# Patient Record
Sex: Female | Born: 1935 | Race: White | Hispanic: No | Smoking: Never smoker
Health system: Southern US, Community
[De-identification: ages and names within clinical notes are randomized; demographics above are authoritative.]

## PROBLEM LIST (undated history)

## (undated) DIAGNOSIS — C801 Malignant (primary) neoplasm, unspecified: Secondary | ICD-10-CM

## (undated) DIAGNOSIS — I10 Essential (primary) hypertension: Secondary | ICD-10-CM

## (undated) DIAGNOSIS — J45909 Unspecified asthma, uncomplicated: Secondary | ICD-10-CM

## (undated) DIAGNOSIS — Z9221 Personal history of antineoplastic chemotherapy: Secondary | ICD-10-CM

## (undated) HISTORY — PX: KIDNEY CYST REMOVAL: SHX684

---

## 1995-03-03 HISTORY — PX: REDUCTION MAMMAPLASTY: SUR839

## 1997-03-02 DIAGNOSIS — C50919 Malignant neoplasm of unspecified site of unspecified female breast: Secondary | ICD-10-CM

## 1997-03-02 DIAGNOSIS — C801 Malignant (primary) neoplasm, unspecified: Secondary | ICD-10-CM

## 1997-03-02 HISTORY — DX: Malignant neoplasm of unspecified site of unspecified female breast: C50.919

## 1997-03-02 HISTORY — DX: Malignant (primary) neoplasm, unspecified: C80.1

## 1997-03-02 HISTORY — PX: MASTECTOMY: SHX3

## 1997-03-02 HISTORY — PX: AUGMENTATION MAMMAPLASTY: SUR837

## 2004-10-21 ENCOUNTER — Ambulatory Visit: Payer: Self-pay | Admitting: Unknown Physician Specialty

## 2007-01-13 ENCOUNTER — Ambulatory Visit: Payer: Self-pay | Admitting: Unknown Physician Specialty

## 2008-05-01 ENCOUNTER — Ambulatory Visit: Payer: Self-pay | Admitting: Unknown Physician Specialty

## 2008-05-15 ENCOUNTER — Ambulatory Visit: Payer: Self-pay | Admitting: Unknown Physician Specialty

## 2009-10-22 ENCOUNTER — Ambulatory Visit: Payer: Self-pay | Admitting: Ophthalmology

## 2009-10-28 ENCOUNTER — Ambulatory Visit: Payer: Self-pay | Admitting: Ophthalmology

## 2009-10-29 ENCOUNTER — Ambulatory Visit: Payer: Self-pay | Admitting: Cardiovascular Disease

## 2010-05-02 ENCOUNTER — Ambulatory Visit: Payer: Self-pay | Admitting: Unknown Physician Specialty

## 2013-09-30 DIAGNOSIS — I129 Hypertensive chronic kidney disease with stage 1 through stage 4 chronic kidney disease, or unspecified chronic kidney disease: Secondary | ICD-10-CM | POA: Insufficient documentation

## 2013-09-30 DIAGNOSIS — J45909 Unspecified asthma, uncomplicated: Secondary | ICD-10-CM | POA: Insufficient documentation

## 2013-09-30 DIAGNOSIS — E7800 Pure hypercholesterolemia, unspecified: Secondary | ICD-10-CM | POA: Insufficient documentation

## 2013-09-30 DIAGNOSIS — K219 Gastro-esophageal reflux disease without esophagitis: Secondary | ICD-10-CM | POA: Insufficient documentation

## 2013-12-07 DIAGNOSIS — Z853 Personal history of malignant neoplasm of breast: Secondary | ICD-10-CM | POA: Insufficient documentation

## 2014-11-19 DIAGNOSIS — Z0000 Encounter for general adult medical examination without abnormal findings: Secondary | ICD-10-CM | POA: Insufficient documentation

## 2015-04-09 DIAGNOSIS — H40053 Ocular hypertension, bilateral: Secondary | ICD-10-CM | POA: Diagnosis not present

## 2015-04-11 DIAGNOSIS — E7800 Pure hypercholesterolemia, unspecified: Secondary | ICD-10-CM | POA: Diagnosis not present

## 2015-04-18 DIAGNOSIS — I129 Hypertensive chronic kidney disease with stage 1 through stage 4 chronic kidney disease, or unspecified chronic kidney disease: Secondary | ICD-10-CM | POA: Diagnosis not present

## 2015-04-18 DIAGNOSIS — K219 Gastro-esophageal reflux disease without esophagitis: Secondary | ICD-10-CM | POA: Diagnosis not present

## 2015-04-18 DIAGNOSIS — J4520 Mild intermittent asthma, uncomplicated: Secondary | ICD-10-CM | POA: Diagnosis not present

## 2015-04-18 DIAGNOSIS — E876 Hypokalemia: Secondary | ICD-10-CM | POA: Insufficient documentation

## 2015-04-18 DIAGNOSIS — N183 Chronic kidney disease, stage 3 (moderate): Secondary | ICD-10-CM | POA: Diagnosis not present

## 2015-04-18 DIAGNOSIS — E7800 Pure hypercholesterolemia, unspecified: Secondary | ICD-10-CM | POA: Diagnosis not present

## 2015-06-12 DIAGNOSIS — H903 Sensorineural hearing loss, bilateral: Secondary | ICD-10-CM | POA: Diagnosis not present

## 2015-06-12 DIAGNOSIS — H6123 Impacted cerumen, bilateral: Secondary | ICD-10-CM | POA: Diagnosis not present

## 2015-08-08 DIAGNOSIS — N183 Chronic kidney disease, stage 3 (moderate): Secondary | ICD-10-CM | POA: Diagnosis not present

## 2015-08-08 DIAGNOSIS — I129 Hypertensive chronic kidney disease with stage 1 through stage 4 chronic kidney disease, or unspecified chronic kidney disease: Secondary | ICD-10-CM | POA: Diagnosis not present

## 2015-08-15 DIAGNOSIS — I129 Hypertensive chronic kidney disease with stage 1 through stage 4 chronic kidney disease, or unspecified chronic kidney disease: Secondary | ICD-10-CM | POA: Diagnosis not present

## 2015-08-15 DIAGNOSIS — J4520 Mild intermittent asthma, uncomplicated: Secondary | ICD-10-CM | POA: Diagnosis not present

## 2015-08-15 DIAGNOSIS — N183 Chronic kidney disease, stage 3 (moderate): Secondary | ICD-10-CM | POA: Diagnosis not present

## 2015-08-15 DIAGNOSIS — E876 Hypokalemia: Secondary | ICD-10-CM | POA: Diagnosis not present

## 2015-08-15 DIAGNOSIS — E7800 Pure hypercholesterolemia, unspecified: Secondary | ICD-10-CM | POA: Diagnosis not present

## 2015-08-22 DIAGNOSIS — L820 Inflamed seborrheic keratosis: Secondary | ICD-10-CM | POA: Diagnosis not present

## 2015-08-22 DIAGNOSIS — L821 Other seborrheic keratosis: Secondary | ICD-10-CM | POA: Diagnosis not present

## 2015-08-22 DIAGNOSIS — L578 Other skin changes due to chronic exposure to nonionizing radiation: Secondary | ICD-10-CM | POA: Diagnosis not present

## 2015-10-08 DIAGNOSIS — H40053 Ocular hypertension, bilateral: Secondary | ICD-10-CM | POA: Diagnosis not present

## 2015-10-30 DIAGNOSIS — L820 Inflamed seborrheic keratosis: Secondary | ICD-10-CM | POA: Diagnosis not present

## 2015-11-22 DIAGNOSIS — Z1231 Encounter for screening mammogram for malignant neoplasm of breast: Secondary | ICD-10-CM | POA: Diagnosis not present

## 2015-12-12 DIAGNOSIS — H6123 Impacted cerumen, bilateral: Secondary | ICD-10-CM | POA: Diagnosis not present

## 2015-12-12 DIAGNOSIS — H902 Conductive hearing loss, unspecified: Secondary | ICD-10-CM | POA: Diagnosis not present

## 2015-12-12 DIAGNOSIS — R42 Dizziness and giddiness: Secondary | ICD-10-CM | POA: Diagnosis not present

## 2015-12-18 DIAGNOSIS — Z853 Personal history of malignant neoplasm of breast: Secondary | ICD-10-CM | POA: Diagnosis not present

## 2016-01-31 DIAGNOSIS — J4520 Mild intermittent asthma, uncomplicated: Secondary | ICD-10-CM | POA: Diagnosis not present

## 2016-01-31 DIAGNOSIS — R05 Cough: Secondary | ICD-10-CM | POA: Diagnosis not present

## 2016-01-31 DIAGNOSIS — J40 Bronchitis, not specified as acute or chronic: Secondary | ICD-10-CM | POA: Diagnosis not present

## 2016-02-13 DIAGNOSIS — E7800 Pure hypercholesterolemia, unspecified: Secondary | ICD-10-CM | POA: Diagnosis not present

## 2016-02-13 DIAGNOSIS — I129 Hypertensive chronic kidney disease with stage 1 through stage 4 chronic kidney disease, or unspecified chronic kidney disease: Secondary | ICD-10-CM | POA: Diagnosis not present

## 2016-02-13 DIAGNOSIS — N183 Chronic kidney disease, stage 3 (moderate): Secondary | ICD-10-CM | POA: Diagnosis not present

## 2016-02-13 DIAGNOSIS — J4520 Mild intermittent asthma, uncomplicated: Secondary | ICD-10-CM | POA: Diagnosis not present

## 2016-02-20 DIAGNOSIS — I129 Hypertensive chronic kidney disease with stage 1 through stage 4 chronic kidney disease, or unspecified chronic kidney disease: Secondary | ICD-10-CM | POA: Diagnosis not present

## 2016-02-20 DIAGNOSIS — E7800 Pure hypercholesterolemia, unspecified: Secondary | ICD-10-CM | POA: Diagnosis not present

## 2016-02-20 DIAGNOSIS — E876 Hypokalemia: Secondary | ICD-10-CM | POA: Diagnosis not present

## 2016-02-20 DIAGNOSIS — J4520 Mild intermittent asthma, uncomplicated: Secondary | ICD-10-CM | POA: Diagnosis not present

## 2016-02-20 DIAGNOSIS — Z0000 Encounter for general adult medical examination without abnormal findings: Secondary | ICD-10-CM | POA: Diagnosis not present

## 2016-02-20 DIAGNOSIS — N183 Chronic kidney disease, stage 3 (moderate): Secondary | ICD-10-CM | POA: Diagnosis not present

## 2016-03-16 DIAGNOSIS — H02005 Unspecified entropion of left lower eyelid: Secondary | ICD-10-CM | POA: Diagnosis not present

## 2016-04-06 DIAGNOSIS — H40053 Ocular hypertension, bilateral: Secondary | ICD-10-CM | POA: Diagnosis not present

## 2016-04-22 DIAGNOSIS — H02005 Unspecified entropion of left lower eyelid: Secondary | ICD-10-CM | POA: Diagnosis not present

## 2016-06-11 DIAGNOSIS — H6123 Impacted cerumen, bilateral: Secondary | ICD-10-CM | POA: Diagnosis not present

## 2016-06-11 DIAGNOSIS — R42 Dizziness and giddiness: Secondary | ICD-10-CM | POA: Diagnosis not present

## 2016-06-17 DIAGNOSIS — H02005 Unspecified entropion of left lower eyelid: Secondary | ICD-10-CM | POA: Diagnosis not present

## 2016-06-29 DIAGNOSIS — D485 Neoplasm of uncertain behavior of skin: Secondary | ICD-10-CM | POA: Diagnosis not present

## 2016-06-29 DIAGNOSIS — L720 Epidermal cyst: Secondary | ICD-10-CM | POA: Diagnosis not present

## 2016-06-29 DIAGNOSIS — C44319 Basal cell carcinoma of skin of other parts of face: Secondary | ICD-10-CM | POA: Diagnosis not present

## 2016-06-29 DIAGNOSIS — Z85828 Personal history of other malignant neoplasm of skin: Secondary | ICD-10-CM | POA: Diagnosis not present

## 2016-06-29 DIAGNOSIS — D229 Melanocytic nevi, unspecified: Secondary | ICD-10-CM | POA: Diagnosis not present

## 2016-06-29 DIAGNOSIS — Z1283 Encounter for screening for malignant neoplasm of skin: Secondary | ICD-10-CM | POA: Diagnosis not present

## 2016-06-29 DIAGNOSIS — Z853 Personal history of malignant neoplasm of breast: Secondary | ICD-10-CM | POA: Diagnosis not present

## 2016-06-29 DIAGNOSIS — D1800 Hemangioma unspecified site: Secondary | ICD-10-CM | POA: Diagnosis not present

## 2016-06-29 DIAGNOSIS — L821 Other seborrheic keratosis: Secondary | ICD-10-CM | POA: Diagnosis not present

## 2016-06-29 DIAGNOSIS — C4491 Basal cell carcinoma of skin, unspecified: Secondary | ICD-10-CM

## 2016-06-29 DIAGNOSIS — L812 Freckles: Secondary | ICD-10-CM | POA: Diagnosis not present

## 2016-06-29 DIAGNOSIS — L578 Other skin changes due to chronic exposure to nonionizing radiation: Secondary | ICD-10-CM | POA: Diagnosis not present

## 2016-06-29 DIAGNOSIS — D692 Other nonthrombocytopenic purpura: Secondary | ICD-10-CM | POA: Diagnosis not present

## 2016-06-29 HISTORY — DX: Basal cell carcinoma of skin, unspecified: C44.91

## 2016-07-09 DIAGNOSIS — R42 Dizziness and giddiness: Secondary | ICD-10-CM | POA: Diagnosis not present

## 2016-07-17 DIAGNOSIS — S86911A Strain of unspecified muscle(s) and tendon(s) at lower leg level, right leg, initial encounter: Secondary | ICD-10-CM | POA: Diagnosis not present

## 2016-08-13 DIAGNOSIS — E7800 Pure hypercholesterolemia, unspecified: Secondary | ICD-10-CM | POA: Diagnosis not present

## 2016-08-13 DIAGNOSIS — N183 Chronic kidney disease, stage 3 (moderate): Secondary | ICD-10-CM | POA: Diagnosis not present

## 2016-08-13 DIAGNOSIS — I129 Hypertensive chronic kidney disease with stage 1 through stage 4 chronic kidney disease, or unspecified chronic kidney disease: Secondary | ICD-10-CM | POA: Diagnosis not present

## 2016-08-13 DIAGNOSIS — E876 Hypokalemia: Secondary | ICD-10-CM | POA: Diagnosis not present

## 2016-08-13 DIAGNOSIS — Z0000 Encounter for general adult medical examination without abnormal findings: Secondary | ICD-10-CM | POA: Diagnosis not present

## 2016-08-20 DIAGNOSIS — E7800 Pure hypercholesterolemia, unspecified: Secondary | ICD-10-CM | POA: Diagnosis not present

## 2016-08-20 DIAGNOSIS — J4520 Mild intermittent asthma, uncomplicated: Secondary | ICD-10-CM | POA: Diagnosis not present

## 2016-08-20 DIAGNOSIS — I129 Hypertensive chronic kidney disease with stage 1 through stage 4 chronic kidney disease, or unspecified chronic kidney disease: Secondary | ICD-10-CM | POA: Diagnosis not present

## 2016-08-20 DIAGNOSIS — N183 Chronic kidney disease, stage 3 (moderate): Secondary | ICD-10-CM | POA: Diagnosis not present

## 2016-10-05 DIAGNOSIS — H401130 Primary open-angle glaucoma, bilateral, stage unspecified: Secondary | ICD-10-CM | POA: Diagnosis not present

## 2016-10-12 DIAGNOSIS — H2512 Age-related nuclear cataract, left eye: Secondary | ICD-10-CM | POA: Diagnosis not present

## 2016-10-14 DIAGNOSIS — J4521 Mild intermittent asthma with (acute) exacerbation: Secondary | ICD-10-CM | POA: Diagnosis not present

## 2016-10-14 DIAGNOSIS — R918 Other nonspecific abnormal finding of lung field: Secondary | ICD-10-CM | POA: Diagnosis not present

## 2016-11-27 DIAGNOSIS — Z1231 Encounter for screening mammogram for malignant neoplasm of breast: Secondary | ICD-10-CM | POA: Diagnosis not present

## 2016-12-14 DIAGNOSIS — H900 Conductive hearing loss, bilateral: Secondary | ICD-10-CM | POA: Diagnosis not present

## 2016-12-14 DIAGNOSIS — H6123 Impacted cerumen, bilateral: Secondary | ICD-10-CM | POA: Diagnosis not present

## 2017-02-18 DIAGNOSIS — I129 Hypertensive chronic kidney disease with stage 1 through stage 4 chronic kidney disease, or unspecified chronic kidney disease: Secondary | ICD-10-CM | POA: Diagnosis not present

## 2017-02-18 DIAGNOSIS — J4520 Mild intermittent asthma, uncomplicated: Secondary | ICD-10-CM | POA: Diagnosis not present

## 2017-02-18 DIAGNOSIS — E7800 Pure hypercholesterolemia, unspecified: Secondary | ICD-10-CM | POA: Diagnosis not present

## 2017-02-18 DIAGNOSIS — N183 Chronic kidney disease, stage 3 (moderate): Secondary | ICD-10-CM | POA: Diagnosis not present

## 2017-02-25 ENCOUNTER — Other Ambulatory Visit: Payer: Self-pay | Admitting: Internal Medicine

## 2017-02-25 DIAGNOSIS — Z0000 Encounter for general adult medical examination without abnormal findings: Secondary | ICD-10-CM | POA: Diagnosis not present

## 2017-02-25 DIAGNOSIS — E890 Postprocedural hypothyroidism: Secondary | ICD-10-CM | POA: Insufficient documentation

## 2017-02-25 DIAGNOSIS — Z853 Personal history of malignant neoplasm of breast: Secondary | ICD-10-CM | POA: Diagnosis not present

## 2017-02-25 DIAGNOSIS — E876 Hypokalemia: Secondary | ICD-10-CM | POA: Diagnosis not present

## 2017-02-25 DIAGNOSIS — J4521 Mild intermittent asthma with (acute) exacerbation: Secondary | ICD-10-CM | POA: Diagnosis not present

## 2017-02-25 DIAGNOSIS — E7800 Pure hypercholesterolemia, unspecified: Secondary | ICD-10-CM | POA: Diagnosis not present

## 2017-02-25 DIAGNOSIS — I129 Hypertensive chronic kidney disease with stage 1 through stage 4 chronic kidney disease, or unspecified chronic kidney disease: Secondary | ICD-10-CM | POA: Diagnosis not present

## 2017-02-25 DIAGNOSIS — N183 Chronic kidney disease, stage 3 (moderate): Secondary | ICD-10-CM | POA: Diagnosis not present

## 2017-03-12 DIAGNOSIS — I10 Essential (primary) hypertension: Secondary | ICD-10-CM | POA: Diagnosis not present

## 2017-03-12 DIAGNOSIS — C50912 Malignant neoplasm of unspecified site of left female breast: Secondary | ICD-10-CM | POA: Diagnosis not present

## 2017-04-09 DIAGNOSIS — I10 Essential (primary) hypertension: Secondary | ICD-10-CM | POA: Diagnosis not present

## 2017-05-04 ENCOUNTER — Inpatient Hospital Stay
Admission: RE | Admit: 2017-05-04 | Discharge: 2017-05-04 | Disposition: A | Discharge status: Home / Self Care | Payer: Self-pay | Source: Ambulatory Visit | Attending: *Deleted | Admitting: *Deleted

## 2017-05-04 ENCOUNTER — Other Ambulatory Visit: Payer: Self-pay | Admitting: *Deleted

## 2017-05-04 ENCOUNTER — Other Ambulatory Visit: Payer: Self-pay | Admitting: Internal Medicine

## 2017-05-04 DIAGNOSIS — Z9289 Personal history of other medical treatment: Secondary | ICD-10-CM

## 2017-05-04 DIAGNOSIS — Z1231 Encounter for screening mammogram for malignant neoplasm of breast: Secondary | ICD-10-CM

## 2017-06-14 DIAGNOSIS — H902 Conductive hearing loss, unspecified: Secondary | ICD-10-CM | POA: Diagnosis not present

## 2017-06-14 DIAGNOSIS — H6123 Impacted cerumen, bilateral: Secondary | ICD-10-CM | POA: Diagnosis not present

## 2017-06-30 DIAGNOSIS — L812 Freckles: Secondary | ICD-10-CM | POA: Diagnosis not present

## 2017-06-30 DIAGNOSIS — Z853 Personal history of malignant neoplasm of breast: Secondary | ICD-10-CM | POA: Diagnosis not present

## 2017-06-30 DIAGNOSIS — I788 Other diseases of capillaries: Secondary | ICD-10-CM | POA: Diagnosis not present

## 2017-06-30 DIAGNOSIS — L820 Inflamed seborrheic keratosis: Secondary | ICD-10-CM | POA: Diagnosis not present

## 2017-06-30 DIAGNOSIS — Z1283 Encounter for screening for malignant neoplasm of skin: Secondary | ICD-10-CM | POA: Diagnosis not present

## 2017-06-30 DIAGNOSIS — Z85828 Personal history of other malignant neoplasm of skin: Secondary | ICD-10-CM | POA: Diagnosis not present

## 2017-06-30 DIAGNOSIS — L821 Other seborrheic keratosis: Secondary | ICD-10-CM | POA: Diagnosis not present

## 2017-06-30 DIAGNOSIS — L578 Other skin changes due to chronic exposure to nonionizing radiation: Secondary | ICD-10-CM | POA: Diagnosis not present

## 2017-06-30 DIAGNOSIS — D1801 Hemangioma of skin and subcutaneous tissue: Secondary | ICD-10-CM | POA: Diagnosis not present

## 2017-06-30 DIAGNOSIS — L720 Epidermal cyst: Secondary | ICD-10-CM | POA: Diagnosis not present

## 2017-06-30 DIAGNOSIS — D225 Melanocytic nevi of trunk: Secondary | ICD-10-CM | POA: Diagnosis not present

## 2017-08-11 DIAGNOSIS — H40053 Ocular hypertension, bilateral: Secondary | ICD-10-CM | POA: Diagnosis not present

## 2017-08-19 DIAGNOSIS — I129 Hypertensive chronic kidney disease with stage 1 through stage 4 chronic kidney disease, or unspecified chronic kidney disease: Secondary | ICD-10-CM | POA: Diagnosis not present

## 2017-08-19 DIAGNOSIS — E7800 Pure hypercholesterolemia, unspecified: Secondary | ICD-10-CM | POA: Diagnosis not present

## 2017-08-19 DIAGNOSIS — N183 Chronic kidney disease, stage 3 (moderate): Secondary | ICD-10-CM | POA: Diagnosis not present

## 2017-08-26 DIAGNOSIS — E7800 Pure hypercholesterolemia, unspecified: Secondary | ICD-10-CM | POA: Diagnosis not present

## 2017-08-26 DIAGNOSIS — I129 Hypertensive chronic kidney disease with stage 1 through stage 4 chronic kidney disease, or unspecified chronic kidney disease: Secondary | ICD-10-CM | POA: Diagnosis not present

## 2017-08-26 DIAGNOSIS — J4521 Mild intermittent asthma with (acute) exacerbation: Secondary | ICD-10-CM | POA: Diagnosis not present

## 2017-08-26 DIAGNOSIS — Z9889 Other specified postprocedural states: Secondary | ICD-10-CM | POA: Diagnosis not present

## 2017-08-26 DIAGNOSIS — R42 Dizziness and giddiness: Secondary | ICD-10-CM | POA: Diagnosis not present

## 2017-08-26 DIAGNOSIS — E876 Hypokalemia: Secondary | ICD-10-CM | POA: Diagnosis not present

## 2017-08-26 DIAGNOSIS — N183 Chronic kidney disease, stage 3 (moderate): Secondary | ICD-10-CM | POA: Diagnosis not present

## 2017-08-31 DIAGNOSIS — R42 Dizziness and giddiness: Secondary | ICD-10-CM | POA: Diagnosis not present

## 2017-08-31 DIAGNOSIS — H8112 Benign paroxysmal vertigo, left ear: Secondary | ICD-10-CM | POA: Diagnosis not present

## 2017-09-06 DIAGNOSIS — R42 Dizziness and giddiness: Secondary | ICD-10-CM | POA: Diagnosis not present

## 2017-09-09 DIAGNOSIS — R42 Dizziness and giddiness: Secondary | ICD-10-CM | POA: Diagnosis not present

## 2017-09-13 DIAGNOSIS — R42 Dizziness and giddiness: Secondary | ICD-10-CM | POA: Diagnosis not present

## 2017-09-15 DIAGNOSIS — R42 Dizziness and giddiness: Secondary | ICD-10-CM | POA: Diagnosis not present

## 2017-09-17 DIAGNOSIS — R42 Dizziness and giddiness: Secondary | ICD-10-CM | POA: Diagnosis not present

## 2017-09-22 DIAGNOSIS — H40053 Ocular hypertension, bilateral: Secondary | ICD-10-CM | POA: Diagnosis not present

## 2017-09-23 DIAGNOSIS — R42 Dizziness and giddiness: Secondary | ICD-10-CM | POA: Diagnosis not present

## 2017-09-30 DIAGNOSIS — R42 Dizziness and giddiness: Secondary | ICD-10-CM | POA: Diagnosis not present

## 2017-10-07 DIAGNOSIS — R42 Dizziness and giddiness: Secondary | ICD-10-CM | POA: Diagnosis not present

## 2017-10-15 DIAGNOSIS — R42 Dizziness and giddiness: Secondary | ICD-10-CM | POA: Diagnosis not present

## 2017-10-20 DIAGNOSIS — H40053 Ocular hypertension, bilateral: Secondary | ICD-10-CM | POA: Diagnosis not present

## 2017-10-22 DIAGNOSIS — R42 Dizziness and giddiness: Secondary | ICD-10-CM | POA: Diagnosis not present

## 2017-11-02 ENCOUNTER — Encounter (HOSPITAL_COMMUNITY): Payer: Self-pay

## 2017-11-02 ENCOUNTER — Other Ambulatory Visit: Payer: Self-pay | Admitting: Internal Medicine

## 2017-11-02 ENCOUNTER — Ambulatory Visit
Admission: RE | Admit: 2017-11-02 | Discharge: 2017-11-02 | Disposition: A | Discharge status: Home / Self Care | Payer: PPO | Source: Ambulatory Visit | Attending: Internal Medicine | Admitting: Internal Medicine

## 2017-11-02 DIAGNOSIS — Z1231 Encounter for screening mammogram for malignant neoplasm of breast: Secondary | ICD-10-CM | POA: Insufficient documentation

## 2017-11-02 HISTORY — DX: Malignant (primary) neoplasm, unspecified: C80.1

## 2017-11-03 DIAGNOSIS — H40053 Ocular hypertension, bilateral: Secondary | ICD-10-CM | POA: Diagnosis not present

## 2017-11-18 DIAGNOSIS — L821 Other seborrheic keratosis: Secondary | ICD-10-CM | POA: Diagnosis not present

## 2017-11-18 DIAGNOSIS — L820 Inflamed seborrheic keratosis: Secondary | ICD-10-CM | POA: Diagnosis not present

## 2017-11-18 DIAGNOSIS — L578 Other skin changes due to chronic exposure to nonionizing radiation: Secondary | ICD-10-CM | POA: Diagnosis not present

## 2017-12-13 DIAGNOSIS — H902 Conductive hearing loss, unspecified: Secondary | ICD-10-CM | POA: Diagnosis not present

## 2017-12-13 DIAGNOSIS — H6123 Impacted cerumen, bilateral: Secondary | ICD-10-CM | POA: Diagnosis not present

## 2018-01-04 DIAGNOSIS — H40053 Ocular hypertension, bilateral: Secondary | ICD-10-CM | POA: Diagnosis not present

## 2018-01-05 ENCOUNTER — Other Ambulatory Visit: Payer: Self-pay | Admitting: Internal Medicine

## 2018-01-05 DIAGNOSIS — Z1231 Encounter for screening mammogram for malignant neoplasm of breast: Secondary | ICD-10-CM

## 2018-02-18 DIAGNOSIS — N183 Chronic kidney disease, stage 3 (moderate): Secondary | ICD-10-CM | POA: Diagnosis not present

## 2018-02-18 DIAGNOSIS — I129 Hypertensive chronic kidney disease with stage 1 through stage 4 chronic kidney disease, or unspecified chronic kidney disease: Secondary | ICD-10-CM | POA: Diagnosis not present

## 2018-02-18 DIAGNOSIS — E7800 Pure hypercholesterolemia, unspecified: Secondary | ICD-10-CM | POA: Diagnosis not present

## 2018-02-28 DIAGNOSIS — N183 Chronic kidney disease, stage 3 (moderate): Secondary | ICD-10-CM | POA: Diagnosis not present

## 2018-02-28 DIAGNOSIS — J45901 Unspecified asthma with (acute) exacerbation: Secondary | ICD-10-CM | POA: Diagnosis not present

## 2018-02-28 DIAGNOSIS — J4521 Mild intermittent asthma with (acute) exacerbation: Secondary | ICD-10-CM | POA: Diagnosis not present

## 2018-02-28 DIAGNOSIS — E876 Hypokalemia: Secondary | ICD-10-CM | POA: Diagnosis not present

## 2018-02-28 DIAGNOSIS — E7800 Pure hypercholesterolemia, unspecified: Secondary | ICD-10-CM | POA: Diagnosis not present

## 2018-02-28 DIAGNOSIS — I129 Hypertensive chronic kidney disease with stage 1 through stage 4 chronic kidney disease, or unspecified chronic kidney disease: Secondary | ICD-10-CM | POA: Diagnosis not present

## 2018-02-28 DIAGNOSIS — Z0000 Encounter for general adult medical examination without abnormal findings: Secondary | ICD-10-CM | POA: Diagnosis not present

## 2018-05-10 ENCOUNTER — Encounter: Payer: Self-pay | Admitting: Emergency Medicine

## 2018-05-10 ENCOUNTER — Emergency Department: Payer: PPO

## 2018-05-10 ENCOUNTER — Inpatient Hospital Stay
Admission: EM | Admit: 2018-05-10 | Discharge: 2018-05-12 | DRG: 193 | Disposition: A | Discharge status: Home / Self Care | Payer: PPO | Attending: Internal Medicine | Admitting: Internal Medicine

## 2018-05-10 ENCOUNTER — Other Ambulatory Visit: Payer: Self-pay

## 2018-05-10 DIAGNOSIS — Z79899 Other long term (current) drug therapy: Secondary | ICD-10-CM

## 2018-05-10 DIAGNOSIS — R05 Cough: Secondary | ICD-10-CM | POA: Diagnosis not present

## 2018-05-10 DIAGNOSIS — Z888 Allergy status to other drugs, medicaments and biological substances status: Secondary | ICD-10-CM | POA: Diagnosis not present

## 2018-05-10 DIAGNOSIS — Z9012 Acquired absence of left breast and nipple: Secondary | ICD-10-CM

## 2018-05-10 DIAGNOSIS — Z87891 Personal history of nicotine dependence: Secondary | ICD-10-CM

## 2018-05-10 DIAGNOSIS — J9601 Acute respiratory failure with hypoxia: Secondary | ICD-10-CM | POA: Diagnosis present

## 2018-05-10 DIAGNOSIS — I10 Essential (primary) hypertension: Secondary | ICD-10-CM | POA: Diagnosis present

## 2018-05-10 DIAGNOSIS — J45901 Unspecified asthma with (acute) exacerbation: Secondary | ICD-10-CM | POA: Diagnosis present

## 2018-05-10 DIAGNOSIS — J189 Pneumonia, unspecified organism: Secondary | ICD-10-CM | POA: Diagnosis not present

## 2018-05-10 DIAGNOSIS — R0689 Other abnormalities of breathing: Secondary | ICD-10-CM | POA: Diagnosis not present

## 2018-05-10 DIAGNOSIS — R000 Tachycardia, unspecified: Secondary | ICD-10-CM | POA: Diagnosis not present

## 2018-05-10 DIAGNOSIS — Z853 Personal history of malignant neoplasm of breast: Secondary | ICD-10-CM

## 2018-05-10 DIAGNOSIS — J181 Lobar pneumonia, unspecified organism: Secondary | ICD-10-CM

## 2018-05-10 DIAGNOSIS — R0602 Shortness of breath: Secondary | ICD-10-CM | POA: Diagnosis not present

## 2018-05-10 HISTORY — DX: Unspecified asthma, uncomplicated: J45.909

## 2018-05-10 HISTORY — DX: Essential (primary) hypertension: I10

## 2018-05-10 LAB — CBC
HEMATOCRIT: 39.5 % (ref 36.0–46.0)
Hemoglobin: 12.9 g/dL (ref 12.0–15.0)
MCH: 29 pg (ref 26.0–34.0)
MCHC: 32.7 g/dL (ref 30.0–36.0)
MCV: 88.8 fL (ref 80.0–100.0)
NRBC: 0.3 % — AB (ref 0.0–0.2)
Platelets: 216 10*3/uL (ref 150–400)
RBC: 4.45 MIL/uL (ref 3.87–5.11)
RDW: 13.8 % (ref 11.5–15.5)
WBC: 7.2 10*3/uL (ref 4.0–10.5)

## 2018-05-10 LAB — BASIC METABOLIC PANEL
Anion gap: 8 (ref 5–15)
BUN: 20 mg/dL (ref 8–23)
CHLORIDE: 108 mmol/L (ref 98–111)
CO2: 26 mmol/L (ref 22–32)
CREATININE: 0.68 mg/dL (ref 0.44–1.00)
Calcium: 9.3 mg/dL (ref 8.9–10.3)
GFR calc non Af Amer: >60 mL/min (ref >60)
Glucose, Bld: 108 mg/dL — ABNORMAL HIGH (ref 70–99)
POTASSIUM: 3.4 mmol/L — AB (ref 3.5–5.1)
SODIUM: 142 mmol/L (ref 135–145)

## 2018-05-10 LAB — INFLUENZA PANEL BY PCR (TYPE A & B)
INFLBPCR: NEGATIVE
Influenza A By PCR: NEGATIVE

## 2018-05-10 LAB — TROPONIN I: Troponin I: <0.03 ng/mL (ref <0.03)

## 2018-05-10 LAB — TSH: TSH: 0.835 u[IU]/mL (ref 0.350–4.500)

## 2018-05-10 MED ORDER — POTASSIUM CHLORIDE CRYS ER 10 MEQ PO TBCR
10.0000 meq | EXTENDED_RELEASE_TABLET | Freq: Two times a day (BID) | ORAL | Status: DC
  Administered 2018-05-10 (×2): 10 meq via ORAL
  Filled 2018-05-10 (×2): qty 1

## 2018-05-10 MED ORDER — SODIUM CHLORIDE 0.9 % IV SOLN
1.0000 g | Freq: Once | INTRAVENOUS | Status: AC
  Administered 2018-05-10: 1 g via INTRAVENOUS
  Filled 2018-05-10: qty 10

## 2018-05-10 MED ORDER — SPIRONOLACTONE 25 MG PO TABS
25.0000 mg | ORAL_TABLET | Freq: Every day | ORAL | Status: DC
  Administered 2018-05-10 – 2018-05-12 (×3): 25 mg via ORAL
  Filled 2018-05-10 (×3): qty 1

## 2018-05-10 MED ORDER — SALINE SPRAY 0.65 % NA SOLN
1.0000 | NASAL | Status: DC | PRN
  Administered 2018-05-11 (×2): 1 via NASAL
  Filled 2018-05-10: qty 44

## 2018-05-10 MED ORDER — ACETAMINOPHEN 325 MG PO TABS: 650.0000 mg | ORAL_TABLET | Freq: Four times a day (QID) | ORAL | Status: DC | PRN

## 2018-05-10 MED ORDER — ACETAMINOPHEN 650 MG RE SUPP: 650.0000 mg | Freq: Four times a day (QID) | RECTAL | Status: DC | PRN

## 2018-05-10 MED ORDER — SODIUM CHLORIDE 0.9 % IV SOLN
INTRAVENOUS | Status: DC
  Administered 2018-05-10 – 2018-05-11 (×2): via INTRAVENOUS

## 2018-05-10 MED ORDER — SODIUM CHLORIDE 0.9 % IV SOLN
500.0000 mg | Freq: Once | INTRAVENOUS | Status: AC
  Administered 2018-05-10: 500 mg via INTRAVENOUS
  Filled 2018-05-10: qty 500

## 2018-05-10 MED ORDER — ENOXAPARIN SODIUM 40 MG/0.4ML ~~LOC~~ SOLN
40.0000 mg | SUBCUTANEOUS | Status: DC
  Administered 2018-05-10 – 2018-05-11 (×2): 40 mg via SUBCUTANEOUS
  Filled 2018-05-10 (×2): qty 0.4

## 2018-05-10 MED ORDER — ALBUTEROL SULFATE HFA 108 (90 BASE) MCG/ACT IN AERS: 2.0000 | INHALATION_SPRAY | RESPIRATORY_TRACT | Status: DC | PRN

## 2018-05-10 MED ORDER — AZITHROMYCIN 250 MG PO TABS
250.0000 mg | ORAL_TABLET | Freq: Every day | ORAL | Status: DC
  Administered 2018-05-11 – 2018-05-12 (×2): 250 mg via ORAL
  Filled 2018-05-10 (×2): qty 1

## 2018-05-10 MED ORDER — IPRATROPIUM-ALBUTEROL 0.5-2.5 (3) MG/3ML IN SOLN
3.0000 mL | Freq: Once | RESPIRATORY_TRACT | Status: AC
  Administered 2018-05-10: 3 mL via RESPIRATORY_TRACT
  Filled 2018-05-10: qty 3

## 2018-05-10 MED ORDER — METOPROLOL TARTRATE 25 MG PO TABS: 12.5000 mg | ORAL_TABLET | Freq: Once | ORAL | Status: DC

## 2018-05-10 MED ORDER — MOMETASONE FURO-FORMOTEROL FUM 200-5 MCG/ACT IN AERO
2.0000 | INHALATION_SPRAY | Freq: Two times a day (BID) | RESPIRATORY_TRACT | Status: DC
  Administered 2018-05-10 – 2018-05-12 (×5): 2 via RESPIRATORY_TRACT
  Filled 2018-05-10: qty 8.8

## 2018-05-10 MED ORDER — ALBUTEROL SULFATE (2.5 MG/3ML) 0.083% IN NEBU
2.5000 mg | INHALATION_SOLUTION | RESPIRATORY_TRACT | Status: DC | PRN
  Administered 2018-05-10 – 2018-05-11 (×3): 2.5 mg via RESPIRATORY_TRACT
  Filled 2018-05-10 (×3): qty 3

## 2018-05-10 MED ORDER — ONDANSETRON HCL 4 MG/2ML IJ SOLN: 4.0000 mg | Freq: Four times a day (QID) | INTRAMUSCULAR | Status: DC | PRN

## 2018-05-10 MED ORDER — ONDANSETRON HCL 4 MG PO TABS: 4.0000 mg | ORAL_TABLET | Freq: Four times a day (QID) | ORAL | Status: DC | PRN

## 2018-05-10 MED ORDER — DOCUSATE SODIUM 100 MG PO CAPS
100.0000 mg | ORAL_CAPSULE | Freq: Two times a day (BID) | ORAL | Status: DC
  Administered 2018-05-10 – 2018-05-12 (×5): 100 mg via ORAL
  Filled 2018-05-10 (×5): qty 1

## 2018-05-10 MED ORDER — LATANOPROST 0.005 % OP SOLN
1.0000 [drp] | Freq: Every day | OPHTHALMIC | Status: DC
  Filled 2018-05-10: qty 2.5

## 2018-05-10 MED ORDER — SODIUM CHLORIDE 0.9 % IV SOLN
1.0000 g | INTRAVENOUS | Status: DC
  Administered 2018-05-11 – 2018-05-12 (×2): 1 g via INTRAVENOUS
  Filled 2018-05-10: qty 1
  Filled 2018-05-10: qty 10

## 2018-05-10 NOTE — ED Notes (Signed)
AAOx3.  Skin warm and dry.  NAD 

## 2018-05-10 NOTE — ED Notes (Signed)
Assisted to toilet in room.

## 2018-05-10 NOTE — Progress Notes (Signed)
Patient admitted to 2A 241 from ED via stretcher.  Report given to this RN.  Patient oriented to room and surroundings.  Family at bedside.  Call bell in reach.  Tele box placed on patient.

## 2018-05-10 NOTE — ED Notes (Signed)
ED TO INPATIENT HANDOFF REPORT  ED Nurse Name and Phone #: Opal Sidles [phone]  S Name/Age/Gender Hayley Nolan 85 y.o. female Room/Bed: ED05A/ED05A  Code Status   Code Status: Not on file  Home/SNF/Other Home Patient oriented to: self, place, time and situation Is this baseline? Yes   Triage Complete: Triage complete  Chief Complaint SOB  Triage Note Pt arrived via EMS from home where EMS found pt tripoding at the end of stairwell in home. Pt received 125mg  solumedrol, 2 duonebs. Pt is able to speak in complete sentences on arrival. Audible rhonchi on assessment.    Allergies Allergies  Allergen Reactions  . Cephalexin Other (See Comments)    Possible headache     Level of Care/Admitting Diagnosis ED Disposition    ED Disposition Condition [facility] Area: [place] [[id number]]  Level of Care: Med-Surg [16]  Diagnosis: CAP (community acquired pneumonia) [818563]  Admitting Physician: Harrie Foreman [[id number]]  Attending Physician: Harrie Foreman [phone]  PT Class (Do Not Modify): Observation [104]  PT Acc Code (Do Not Modify): Observation [10022]       B Medical/Surgery History Past Medical History:  Diagnosis Date  . Asthma   . Cancer [facility]) 1999   Left breast pre cancerous- mastectomy with reconstruction  . Hypertension    Past Surgical History:  Procedure Laterality Date  . AUGMENTATION MAMMAPLASTY Left 1999  . MASTECTOMY Left 1999   Implant     A IV Location/Drains/Wounds Patient Lines/Drains/Airways Status   Active Line/Drains/Airways    Name:   Placement date:   Placement time:   Site:   Days:   Peripheral IV 05/10/18 Left Wrist   05/10/18    0458    Wrist   less than 1   Peripheral IV 05/10/18 Right Forearm   05/10/18    0704    Forearm   less than 1          Intake/Output Last 24 hours  Intake/Output Summary (Last 24 hours) at 05/10/2018 0277 Last data filed at 05/10/2018 0804 Gross per 24  hour  Intake 202.34 ml  Output -  Net 202.34 ml    Labs/Imaging Results for orders placed or performed during the hospital encounter of 05/10/18 (from the past 48 hour(s))  Basic metabolic panel     Status: Abnormal   Collection Time: 05/10/18  5:01 AM  Result Value Ref Range   Sodium 142 135 - 145 mmol/L   Potassium 3.4 (L) 3.5 - 5.1 mmol/L   Chloride 108 98 - 111 mmol/L   CO2 26 22 - 32 mmol/L   Glucose, Bld 108 (H) 70 - 99 mg/dL   BUN 20 8 - 23 mg/dL   Creatinine, Ser 0.68 0.44 - 1.00 mg/dL   Calcium 9.3 8.9 - 10.3 mg/dL   GFR calc non Af Amer >60 >60 mL/min   GFR calc Af Amer >60 >60 mL/min   Anion gap 8 5 - 15    Comment: Performed at [facility], [address] [place], [place] [address]  CBC     Status: Abnormal   Collection Time: 05/10/18  5:01 AM  Result Value Ref Range   WBC 7.2 4.0 - 10.5 K/uL   RBC 4.45 3.87 - 5.11 MIL/uL   Hemoglobin 12.9 12.0 - 15.0 g/dL   HCT 39.5 36.0 - 46.0 %   MCV 88.8 80.0 - 100.0 fL   MCH 29.0 26.0 - 34.0 pg  MCHC 32.7 30.0 - 36.0 g/dL   RDW 13.8 11.5 - 15.5 %   Platelets 216 150 - 400 K/uL   nRBC 0.3 (H) 0.0 - 0.2 %    Comment: Performed at [facility], [address] [place], [place] [address]  Troponin I - ONCE - STAT     Status: None   Collection Time: 05/10/18  5:01 AM  Result Value Ref Range   Troponin I <0.03 <0.03 ng/mL    Comment: Performed at [facility], [address] [place], [place] [address]  Influenza panel by PCR (type A & B)     Status: None   Collection Time: 05/10/18  5:01 AM  Result Value Ref Range   Influenza A By PCR NEGATIVE NEGATIVE   Influenza B By PCR NEGATIVE NEGATIVE    Comment: (NOTE) The Xpert Xpress Flu assay is intended as an aid in the diagnosis of  influenza and should not be used as a sole basis for treatment.  This  assay is FDA approved for nasopharyngeal swab specimens only. Nasal  washings and aspirates are unacceptable for Xpert Xpress Flu  testing. Performed at [facility], [address] [place], [place] [address]    Dg Chest Portable 1 View  Result Date: 05/10/2018 CLINICAL DATA:  Increasing shortness of breath EXAM: PORTABLE CHEST 1 VIEW COMPARISON:  05/02/2010 FINDINGS: Cardiac shadow is stable. Aortic calcifications are noted. Left basilar infiltrate is seen without sizable effusion. Lungs are otherwise clear. Prior breast implant on the left is noted. No bony abnormality is seen. IMPRESSION: Increased density in the left base consistent with acute infiltrate. Electronically Signed   By: Inez Catalina M.D.   On: 05/10/2018 05:37    Pending Labs [employer] (From admission, onward)    Start     Ordered   Signed and Held  Creatinine, serum  (enoxaparin (LOVENOX)    CrCl >/= 30 ml/min)  Weekly,   R    Comments:  while on enoxaparin therapy    Signed and Held   Signed and Held  TSH  Add-on,   R     Signed and Held          Vitals/Pain Today's Vitals   05/10/18 0452 05/10/18 0453 05/10/18 0455  BP: (!) 148/78 (!) 148/78 (!) 157/93  Pulse: (!) 102 99   Resp:  (!) 26   Temp:  97.6 F (36.4 C)   TempSrc:  Oral   SpO2: 94% 95%     Isolation Precautions Droplet precaution  Medications Medications  ipratropium-albuterol (DUONEB) 0.5-2.5 (3) MG/3ML nebulizer solution 3 mL (3 mLs Nebulization Given 05/10/18 0503)  cefTRIAXone (ROCEPHIN) 1 g in sodium chloride 0.9 % 100 mL IVPB ( Intravenous Stopped 05/10/18 0704)  azithromycin (ZITHROMAX) 500 mg in sodium chloride 0.9 % 250 mL IVPB (0 mg Intravenous Stopped 05/10/18 0804)    Mobility walks Low fall risk   Focused Assessments    R Recommendations: See Admitting Provider Note  Report given to:   Additional Notes:

## 2018-05-10 NOTE — ED Triage Notes (Signed)
Pt arrived via EMS from home where EMS found pt tripoding at the end of stairwell in home. Pt received 125mg  solumedrol, 2 duonebs. Pt is able to speak in complete sentences on arrival. Audible rhonchi on assessment.

## 2018-05-10 NOTE — ED Provider Notes (Signed)
[facility] Emergency Department Provider Note  ____________________________________________  Time seen: Approximately 6:19 AM  I have reviewed the triage vital signs and the nursing notes.   HISTORY  Chief Complaint Shortness of Breath    HPI Hayley Nolan is a 85 y.o. female with a past history of hypertension asthma and breast cancer who comes the ED due to shortness of breath, gradually worsening throughout the night.  She states that she is felt like she had a cold for the past 2 to 3 days and got worse tonight.  No aggravating or alleviating factors.  Denies chest pain.  Associated with a congested cough.  Denies fever.  EMS report an initial oxygen saturation of about 84%.  They gave 125 of Solu-Medrol and 2 duo nebs en route.    Past Medical History:  Diagnosis Date  . Asthma   . Cancer [facility]) 1999   Left breast pre cancerous- mastectomy with reconstruction  . Hypertension      There are no active problems to display for this patient.    Past Surgical History:  Procedure Laterality Date  . AUGMENTATION MAMMAPLASTY Left 1999  . MASTECTOMY Left 1999   Implant     Prior to Admission medications   Not on File     Allergies Patient has no known allergies.   Family History  Problem Relation Age of Onset  . Breast cancer Neg Hx     Social History Social History   Tobacco Use  . Smoking status: Former Research scientist (life sciences)  . Smokeless tobacco: Never Used  Substance Use Topics  . Alcohol use: Not on file  . Drug use: Not on file    Review of Systems  Constitutional:   No fever or chills.  ENT:   No sore throat.  Positive rhinorrhea. Cardiovascular:   No chest pain or syncope. Respiratory: Positive shortness of breath and cough. Gastrointestinal:   Negative for abdominal pain, vomiting and diarrhea.  Musculoskeletal:   Negative for focal pain or swelling All other systems reviewed and are negative except as documented above in ROS  and HPI.  ____________________________________________   PHYSICAL EXAM:  VITAL SIGNS: ED Triage Vitals  Enc Vitals Group     BP 05/10/18 0452 (!) 148/78     Pulse Rate 05/10/18 0452 (!) 102     Resp 05/10/18 0453 (!) 26     Temp 05/10/18 0453 97.6 F (36.4 C)     Temp Source 05/10/18 0453 Oral     SpO2 05/10/18 0452 94 %     Weight --      Height --      Head Circumference --      Peak Flow --      Pain Score --      Pain Loc --      Pain Edu? --      Excl. in [place]? --     Vital signs reviewed, nursing assessments reviewed.   Constitutional:   Alert and oriented.  Eyes:   Conjunctivae are normal. EOMI. PERRL. ENT      Head:   Normocephalic and atraumatic.      Nose:   No congestion/rhinnorhea.       Mouth/Throat:   MMM, no pharyngeal erythema. No peritonsillar mass.       Neck:   No meningismus. Full ROM. Hematological/Lymphatic/Immunilogical:   No cervical lymphadenopathy. Cardiovascular:   Tachycardia heart rate 100. Symmetric bilateral radial and DP pulses.  No murmurs. Cap refill less  than 2 seconds. Respiratory: Tachypnea, diffuse expiratory wheezing.  No focal crackles Gastrointestinal:   Soft and nontender. Non distended. There is no CVA tenderness.  No rebound, rigidity, or guarding.  Musculoskeletal:   Normal range of motion in all extremities. No joint effusions.  No lower extremity tenderness.  No edema. Neurologic:   Normal speech and language.  Motor grossly intact. No acute focal neurologic deficits are appreciated.  Skin:    Skin is warm, dry and intact. No rash noted.  No petechiae, purpura, or bullae.  ____________________________________________    LABS (pertinent positives/negatives) (all labs ordered are listed, but only abnormal results are displayed) Labs Reviewed  BASIC METABOLIC PANEL - Abnormal; Notable for the following components:      Result Value   Potassium 3.4 (*)    Glucose, Bld 108 (*)    All other components within normal  limits  CBC - Abnormal; Notable for the following components:   nRBC 0.3 (*)    All other components within normal limits  TROPONIN I  INFLUENZA PANEL BY PCR (TYPE A & B)   ____________________________________________   EKG  Interpreted by me Sinus tachycardia rate 106, left axis, normal intervals.  Poor R wave progression.  Normal ST segments and T waves.  ____________________________________________    OINOMVEHM  Dg Chest Portable 1 View  Result Date: 05/10/2018 CLINICAL DATA:  Increasing shortness of breath EXAM: PORTABLE CHEST 1 VIEW COMPARISON:  05/02/2010 FINDINGS: Cardiac shadow is stable. Aortic calcifications are noted. Left basilar infiltrate is seen without sizable effusion. Lungs are otherwise clear. Prior breast implant on the left is noted. No bony abnormality is seen. IMPRESSION: Increased density in the left base consistent with acute infiltrate. Electronically Signed   By: Inez Catalina M.D.   On: 05/10/2018 05:37    ____________________________________________   PROCEDURES Procedures  ____________________________________________  DIFFERENTIAL DIAGNOSIS   Pneumonia, pleural effusion, pulmonary edema, influenza, exacerbation of underlying COPD.  CLINICAL IMPRESSION / ASSESSMENT AND PLAN / ED COURSE  Medications ordered in the ED: Medications  cefTRIAXone (ROCEPHIN) 1 g in sodium chloride 0.9 % 100 mL IVPB (has no administration in time range)  azithromycin (ZITHROMAX) 500 mg in sodium chloride 0.9 % 250 mL IVPB (has no administration in time range)  ipratropium-albuterol (DUONEB) 0.5-2.5 (3) MG/3ML nebulizer solution 3 mL (3 mLs Nebulization Given 05/10/18 0503)    Pertinent labs & imaging results that were available during my care of the patient were reviewed by me and considered in my medical decision making (see chart for details).    Patient presents with shortness of breath.  Suspect COPD exacerbation or influenza.  On my initial assessment, I doubt  sepsis.  ----------------------------------------- 6:35 AM on 05/10/2018 ----------------------------------------- Labs unremarkable, white blood cell count normal.  Influenza negative.  Chest x-ray does show a left basilar infiltrate consistent with pneumonia.  After bronchodilators, respirations are improved, but still hypoxic to 88% on room air.  Will place on 2 L nasal cannula.  Plan to hospitalize for further management given her age and underlying lung disease.  Start ceftriaxone and azithromycin..      ____________________________________________   FINAL CLINICAL IMPRESSION(S) / ED DIAGNOSES    Final diagnoses:  Community acquired pneumonia of left lower lobe of lung ([place])  Acute respiratory failure with hypoxia [facility])     ED Discharge Orders    None      Portions of this note were generated with dragon dictation software. Dictation errors may occur despite best attempts at  proofreading.   Carrie Mew, MD 05/10/18 [phone]

## 2018-05-10 NOTE — H&P (Signed)
Hayley Nolan is an 85 y.o. female.   Chief Complaint: Shortness of breath HPI: Patient with past medical history of hypertension, asthma and history of cancer status post mastectomy presents to the emergency department complaining of shortness of breath.  The patient states that her dyspnea began today and is associated with a nonproductive cough.  Chest x-ray showed left lower lobe infiltrate consistent with pneumonia.  The patient started on fraction and azithromycin prior to the emergency department staff calling the hospitalist service for admission.  Past Medical History:  Diagnosis Date  . Asthma   . Cancer [facility]) 1999   Left breast pre cancerous- mastectomy with reconstruction  . Hypertension     Past Surgical History:  Procedure Laterality Date  . AUGMENTATION MAMMAPLASTY Left 1999  . MASTECTOMY Left 1999   Implant    Family History  Problem Relation Age of Onset  . Breast cancer Neg Hx    Social History:  reports that she has quit smoking. She has never used smokeless tobacco. No history on file for alcohol and drug.  Allergies:  Allergies  Allergen Reactions  . Cephalexin Other (See Comments)    Possible headache     Prior to Admission medications   Medication Sig Start Date End Date Taking? Authorizing Provider  ADVAIR DISKUS 250-50 MCG/DOSE AEPB Inhale 1 puff into the lungs every 12 (twelve) hours. 03/03/18  Yes [provider]  Cholecalciferol 25 MCG (1000 UT) tablet Take 1,000 Units by mouth daily.   Yes [provider]  latanoprost (XALATAN) 0.005 % ophthalmic solution Apply 1 drop to eye at bedtime.   Yes [provider]  Multiple Vitamins-Minerals (MULTIVITAL PLATINUM SILVER PO) Take 1 tablet by mouth daily.   Yes [provider]  Multiple Vitamins-Minerals (PRESERVISION AREDS) CAPS Take 1 capsule by mouth daily.   Yes [provider]  potassium chloride (K-DUR,KLOR-CON) 10 MEQ tablet Take 10 mEq by mouth 2 (two)  times daily. 04/30/18  Yes [provider]  PROAIR HFA 108 (90 Base) MCG/ACT inhaler Inhale 2 puffs into the lungs every 4 (four) hours as needed for wheezing. 04/02/18  Yes [provider]  spironolactone (ALDACTONE) 25 MG tablet Take 25 mg by mouth daily. 04/18/18  Yes [provider]     Results for orders placed or performed during the hospital encounter of 05/10/18 (from the past 48 hour(s))  Basic metabolic panel     Status: Abnormal   Collection Time: 05/10/18  5:01 AM  Result Value Ref Range   Sodium 142 135 - 145 mmol/L   Potassium 3.4 (L) 3.5 - 5.1 mmol/L   Chloride 108 98 - 111 mmol/L   CO2 26 22 - 32 mmol/L   Glucose, Bld 108 (H) 70 - 99 mg/dL   BUN 20 8 - 23 mg/dL   Creatinine, Ser 0.68 0.44 - 1.00 mg/dL   Calcium 9.3 8.9 - 10.3 mg/dL   GFR calc non Af Amer >60 >60 mL/min   GFR calc Af Amer >60 >60 mL/min   Anion gap 8 5 - 15    Comment: Performed at [facility], [address] [place], [place] [address]  CBC     Status: Abnormal   Collection Time: 05/10/18  5:01 AM  Result Value Ref Range   WBC 7.2 4.0 - 10.5 K/uL   RBC 4.45 3.87 - 5.11 MIL/uL   Hemoglobin 12.9 12.0 - 15.0 g/dL   HCT 39.5 36.0 - 46.0 %   MCV 88.8  80.0 - 100.0 fL   MCH 29.0 26.0 - 34.0 pg   MCHC 32.7 30.0 - 36.0 g/dL   RDW 13.8 11.5 - 15.5 %   Platelets 216 150 - 400 K/uL   nRBC 0.3 (H) 0.0 - 0.2 %    Comment: Performed at [facility], [address] [place], [place] [address]  Troponin I - ONCE - STAT     Status: None   Collection Time: 05/10/18  5:01 AM  Result Value Ref Range   Troponin I <0.03 <0.03 ng/mL    Comment: Performed at [facility], [address] [place], [place] [address]  Influenza panel by PCR (type A & B)     Status: None   Collection Time: 05/10/18  5:01 AM  Result Value Ref Range   Influenza A By PCR NEGATIVE NEGATIVE   Influenza B By PCR NEGATIVE NEGATIVE    Comment: (NOTE) The Xpert Xpress Flu assay is  intended as an aid in the diagnosis of  influenza and should not be used as a sole basis for treatment.  This  assay is FDA approved for nasopharyngeal swab specimens only. Nasal  washings and aspirates are unacceptable for Xpert Xpress Flu testing. Performed at [facility], [address] [place], [place] [address]    Dg Chest Portable 1 View  Result Date: 05/10/2018 CLINICAL DATA:  Increasing shortness of breath EXAM: PORTABLE CHEST 1 VIEW COMPARISON:  05/02/2010 FINDINGS: Cardiac shadow is stable. Aortic calcifications are noted. Left basilar infiltrate is seen without sizable effusion. Lungs are otherwise clear. Prior breast implant on the left is noted. No bony abnormality is seen. IMPRESSION: Increased density in the left base consistent with acute infiltrate. Electronically Signed   By: Inez Catalina M.D.   On: 05/10/2018 05:37    Review of Systems  Constitutional: Negative for chills and fever.  HENT: Negative for sore throat and tinnitus.   Eyes: Negative for blurred vision and redness.  Respiratory: Negative for cough and shortness of breath.   Cardiovascular: Negative for chest pain, palpitations, orthopnea and PND.  Gastrointestinal: Negative for abdominal pain, diarrhea, nausea and vomiting.  Genitourinary: Negative for dysuria, frequency and urgency.  Musculoskeletal: Negative for joint pain and myalgias.  Skin: Negative for rash.       No lesions  Neurological: Negative for speech change, focal weakness and weakness.  Endo/Heme/Allergies: Does not bruise/bleed easily.       No temperature intolerance  Psychiatric/Behavioral: Negative for depression and suicidal ideas.    Blood pressure (!) 157/93, pulse 99, temperature 97.6 F (36.4 C), temperature source Oral, resp. rate (!) 26, SpO2 95 %. Physical Exam  Vitals reviewed. Constitutional: She is oriented to person, place, and time. She appears well-developed and well-nourished. No distress.  HENT:  Head:  Normocephalic and atraumatic.  Mouth/Throat: Oropharynx is clear and moist.  Eyes: Pupils are equal, round, and reactive to light. Conjunctivae and EOM are normal. No scleral icterus.  Neck: Normal range of motion. Neck supple. No JVD present. No tracheal deviation present. No thyromegaly present.  Cardiovascular: Normal rate, regular rhythm and normal heart sounds. Exam reveals no gallop and no friction rub.  No murmur heard. Respiratory: Effort normal. She has wheezes.  GI: Soft. Bowel sounds are normal. She exhibits no distension. There is no abdominal tenderness.  Genitourinary:    Genitourinary Comments: Deferred   Lymphadenopathy:    She has no cervical adenopathy.  Neurological: She is alert and oriented to person, place,  and time. No cranial nerve deficit. She exhibits normal muscle tone.  Skin: Skin is warm and dry. No rash noted. No erythema.  Psychiatric: She has a normal mood and affect. Her behavior is normal. Judgment and thought content normal.     Assessment/Plan This is an 85 year old female admitted for pneumonia. 1.  Pneumonia: Community-acquired; continue azithromycin and ceftriaxone.  Pneumonia severity index 78 indicating brief hospital stay (2.8% 30-day mortality).  No assistant signs or symptoms of sepsis.  Supplemental oxygen as needed.  The patient does not wear oxygen at home 2.  Asthma: Mild; with exacerbation secondary to above.  Continue inhaled corticosteroid.  Albuterol as needed. 3.  Hypertension: Acceptable for age; continue home medications once verified by pharmacy 4.  DVT prophylaxis: Lovenox 5.  GI prophylaxis: None The patient is a full code.  Time spent on admission orders and patient care approximately 45 minutes  Harrie Foreman, MD 05/10/2018, 7:39 AM

## 2018-05-11 DIAGNOSIS — I10 Essential (primary) hypertension: Secondary | ICD-10-CM | POA: Diagnosis present

## 2018-05-11 DIAGNOSIS — J45901 Unspecified asthma with (acute) exacerbation: Secondary | ICD-10-CM | POA: Diagnosis present

## 2018-05-11 DIAGNOSIS — Z79899 Other long term (current) drug therapy: Secondary | ICD-10-CM | POA: Diagnosis not present

## 2018-05-11 DIAGNOSIS — Z87891 Personal history of nicotine dependence: Secondary | ICD-10-CM | POA: Diagnosis not present

## 2018-05-11 DIAGNOSIS — Z853 Personal history of malignant neoplasm of breast: Secondary | ICD-10-CM | POA: Diagnosis not present

## 2018-05-11 DIAGNOSIS — J9601 Acute respiratory failure with hypoxia: Secondary | ICD-10-CM | POA: Diagnosis present

## 2018-05-11 DIAGNOSIS — Z888 Allergy status to other drugs, medicaments and biological substances status: Secondary | ICD-10-CM | POA: Diagnosis not present

## 2018-05-11 DIAGNOSIS — J189 Pneumonia, unspecified organism: Secondary | ICD-10-CM | POA: Diagnosis present

## 2018-05-11 DIAGNOSIS — Z9012 Acquired absence of left breast and nipple: Secondary | ICD-10-CM | POA: Diagnosis not present

## 2018-05-11 LAB — BASIC METABOLIC PANEL
Anion gap: 9 (ref 5–15)
BUN: 18 mg/dL (ref 8–23)
CO2: 23 mmol/L (ref 22–32)
CREATININE: 0.62 mg/dL (ref 0.44–1.00)
Calcium: 8.9 mg/dL (ref 8.9–10.3)
Chloride: 110 mmol/L (ref 98–111)
GFR calc Af Amer: >60 mL/min (ref >60)
GFR calc non Af Amer: >60 mL/min (ref >60)
Glucose, Bld: 110 mg/dL — ABNORMAL HIGH (ref 70–99)
Potassium: 3.2 mmol/L — ABNORMAL LOW (ref 3.5–5.1)
Sodium: 142 mmol/L (ref 135–145)

## 2018-05-11 LAB — BRAIN NATRIURETIC PEPTIDE: B Natriuretic Peptide: 129 pg/mL — ABNORMAL HIGH (ref 0.0–100.0)

## 2018-05-11 MED ORDER — METHYLPREDNISOLONE SODIUM SUCC 125 MG IJ SOLR
60.0000 mg | Freq: Two times a day (BID) | INTRAMUSCULAR | Status: DC
  Administered 2018-05-11 – 2018-05-12 (×3): 60 mg via INTRAVENOUS
  Filled 2018-05-11 (×3): qty 2

## 2018-05-11 MED ORDER — IPRATROPIUM-ALBUTEROL 0.5-2.5 (3) MG/3ML IN SOLN
3.0000 mL | Freq: Four times a day (QID) | RESPIRATORY_TRACT | Status: DC
  Administered 2018-05-11 – 2018-05-12 (×4): 3 mL via RESPIRATORY_TRACT
  Filled 2018-05-11 (×4): qty 3

## 2018-05-11 MED ORDER — POTASSIUM CHLORIDE CRYS ER 20 MEQ PO TBCR
40.0000 meq | EXTENDED_RELEASE_TABLET | ORAL | Status: AC
  Administered 2018-05-11 (×2): 40 meq via ORAL
  Filled 2018-05-11 (×2): qty 2

## 2018-05-11 MED ORDER — GUAIFENESIN-DM 100-10 MG/5ML PO SYRP
5.0000 mL | ORAL_SOLUTION | ORAL | Status: DC | PRN
  Administered 2018-05-11: 5 mL via ORAL
  Filled 2018-05-11 (×2): qty 5

## 2018-05-11 NOTE — Progress Notes (Signed)
RN asked RT to give PRN treatment to patient. Patient experiencing shortness of breath. 92% SAT on Room Air. Bilateral coarse crackles. PRN treatment given for shortness of breath.

## 2018-05-11 NOTE — Progress Notes (Signed)
[facility] [place] NAME: Nili Honda    MR#:  [id number]  DATE OF BIRTH:  07/13/1927  SUBJECTIVE:  CHIEF COMPLAINT:   Chief Complaint  Patient presents with  . Shortness of Breath   Still has cough and SOB. Chest congestion.  REVIEW OF SYSTEMS:    Review of Systems  Constitutional: Positive for malaise/fatigue. Negative for chills and fever.  HENT: Negative for sore throat.   Eyes: Negative for blurred vision, double vision and pain.  Respiratory: Positive for cough, shortness of breath and wheezing. Negative for hemoptysis.   Cardiovascular: Negative for chest pain, palpitations, orthopnea and leg swelling.  Gastrointestinal: Negative for abdominal pain, constipation, diarrhea, heartburn, nausea and vomiting.  Genitourinary: Negative for dysuria and hematuria.  Musculoskeletal: Negative for back pain and joint pain.  Skin: Negative for rash.  Neurological: Negative for sensory change, speech change, focal weakness and headaches.  Endo/Heme/Allergies: Does not bruise/bleed easily.  Psychiatric/Behavioral: Negative for depression. The patient is not nervous/anxious.     DRUG ALLERGIES:   Allergies  Allergen Reactions  . Cephalexin Other (See Comments)    Possible headache     VITALS:  Blood pressure (!) 149/71, pulse 97, temperature 98.9 F (37.2 C), temperature source Oral, resp. rate 18, height 5\' 4"  (1.626 m), weight 61.9 kg, SpO2 93 %.  PHYSICAL EXAMINATION:   Physical Exam  GENERAL:  85 y.o.-year-old patient lying in the bed with no acute distress.  EYES: Pupils equal, round, reactive to light and accommodation. No scleral icterus. Extraocular muscles intact.  HEENT: Head atraumatic, normocephalic. Oropharynx and nasopharynx clear.  NECK:  Supple, no jugular venous distention. No thyroid enlargement, no tenderness.  LUNGS: b/l wheezing. CARDIOVASCULAR: S1, S2 normal. No murmurs, rubs, or gallops.  ABDOMEN: Soft,  nontender, nondistended. Bowel sounds present. No organomegaly or mass.  EXTREMITIES: No cyanosis, clubbing or edema b/l.    NEUROLOGIC: Cranial nerves II through XII are intact. No focal Motor or sensory deficits b/l.   PSYCHIATRIC: The patient is alert and oriented x 3.  SKIN: No obvious rash, lesion, or ulcer.   LABORATORY PANEL:   CBC Recent Labs  Lab 05/10/18 0501  WBC 7.2  HGB 12.9  HCT 39.5  PLT 216   ------------------------------------------------------------------------------------------------------------------ Chemistries  Recent Labs  Lab 05/11/18 0328  NA 142  K 3.2*  CL 110  CO2 23  GLUCOSE 110*  BUN 18  CREATININE 0.62  CALCIUM 8.9   ------------------------------------------------------------------------------------------------------------------  Cardiac Enzymes Recent Labs  Lab 05/10/18 0501  TROPONINI <0.03   ------------------------------------------------------------------------------------------------------------------  RADIOLOGY:  Dg Chest Portable 1 View  Result Date: 05/10/2018 CLINICAL DATA:  Increasing shortness of breath EXAM: PORTABLE CHEST 1 VIEW COMPARISON:  05/02/2010 FINDINGS: Cardiac shadow is stable. Aortic calcifications are noted. Left basilar infiltrate is seen without sizable effusion. Lungs are otherwise clear. Prior breast implant on the left is noted. No bony abnormality is seen. IMPRESSION: Increased density in the left base consistent with acute infiltrate. Electronically Signed   By: Inez Catalina M.D.   On: 05/10/2018 05:37     ASSESSMENT AND PLAN:   * Left lower lobe Pneumonia IV abx Will start steroids due to wheezing Nebs Cx BGTD  * Asthma exacerbation due to PNA Starting steroids Start Scheduled nebs  * HTN Continue home meds   All the records are reviewed and case discussed with Care Management/Social Workerr. Management plans discussed with the patient, family and they are in agreement.  CODE STATUS:  FULL CODE  DVT Prophylaxis: SCDs  TOTAL TIME TAKING CARE OF THIS PATIENT: 35 minutes.   POSSIBLE D/C IN 1-2 DAYS, DEPENDING ON CLINICAL CONDITION.  Leia Alf Akemi Overholser M.D on 05/11/2018 at 1:32 PM  Between 7am to 6pm - Pager - [phone]  After 6pm go to [email or web address] - password [email or web address] [facility]  Office  [phone]  CC: Primary care physician; Kirk Ruths, MD  Note: This dictation was prepared with Dragon dictation along with smaller phrase technology. Any transcriptional errors that result from this process are unintentional.

## 2018-05-11 NOTE — Plan of Care (Signed)

## 2018-05-12 MED ORDER — PREDNISONE 10 MG (21) PO TBPK: ORAL_TABLET | ORAL | 0 refills | Status: DC

## 2018-05-12 MED ORDER — LEVOFLOXACIN 500 MG PO TABS: 500.0000 mg | ORAL_TABLET | Freq: Every day | ORAL | 0 refills | Status: DC

## 2018-05-12 MED ORDER — GUAIFENESIN-DM 100-10 MG/5ML PO SYRP: 5.0000 mL | ORAL_SOLUTION | ORAL | 0 refills | Status: DC | PRN

## 2018-05-12 NOTE — Discharge Instructions (Signed)
Resume diet and activity as before ° ° °

## 2018-05-12 NOTE — TOC Initial Note (Signed)
Transition of Care [facility]) - Initial/Assessment Note    Patient Details  Name: Hayley Nolan MRN: [id number] Date of Birth: 03/02/1928  [facility]) CM/SW Contact:    Elza Rafter, RN Phone Number: 05/12/2018, 10:58 AM  Clinical Narrative:      Independent in all adls, denies issues accessing medical care, obtaining medications or with transportation.  Current with PCP.  No discharge needs identified at present by care manager or members of care team.               Expected Discharge Plan: Home/Self Care Barriers to Discharge: No Barriers Identified   Patient Goals and CMS Choice        Expected Discharge Plan and Services Expected Discharge Plan: Home/Self Care     Living arrangements for the past 2 months: Single Family Home                          Prior Living Arrangements/Services Living arrangements for the past 2 months: Single Family Home Lives with:: Self Patient language and need for interpreter reviewed:: Yes Do you feel safe going back to the place where you live?: Yes        Care giver support system in place?: Yes (comment)   Criminal Activity/Legal Involvement Pertinent to Current Situation/Hospitalization: No - Comment as needed  Activities of Daily Living Home Assistive Devices/Equipment: Eyeglasses ADL Screening (condition at time of admission) Patient's cognitive ability adequate to safely complete daily activities?: Yes Is the patient deaf or have difficulty hearing?: No Does the patient have difficulty seeing, even when wearing glasses/contacts?: No Does the patient have difficulty concentrating, remembering, or making decisions?: No Patient able to express need for assistance with ADLs?: Yes Does the patient have difficulty dressing or bathing?: No Independently performs ADLs?: Yes (appropriate for developmental age) Does the patient have difficulty walking or climbing stairs?: No Weakness of Legs: None Weakness of  Arms/Hands: None  Permission Sought/Granted                  Emotional Assessment Appearance:: Appears stated age Attitude/Demeanor/Rapport: Gracious Affect (typically observed): Accepting Orientation: : Oriented to Self, Oriented to Place, Oriented to  Time, Oriented to Situation Alcohol / Substance Use: Not Applicable    Admission diagnosis:  Acute respiratory failure with hypoxia ([place]) [J96.01] Community acquired pneumonia of left lower lobe of lung ([place]) [J18.1] Patient Active Problem List   Diagnosis Date Noted  . CAP (community acquired pneumonia) 05/10/2018   PCP:  Kirk Ruths, MD Pharmacy:   [place], [place] [facility] [address] [place] [address] Phone: [phone] Fax: [phone]     Social Determinants of Health (SDOH) Interventions    Readmission Risk Interventions 30 Day Unplanned Readmission Risk Score     ED to Hosp-Admission (Current) from 05/10/2018 in [facility] (2A)  30 Day Unplanned Readmission Risk Score (%)  10 Filed at 05/12/2018 0800     This score is the patient's risk of an unplanned readmission within 30 days of being discharged (0 -100%). The score is based on dignosis, age, lab data, medications, orders, and past utilization.   Low:  0-14.9   Medium: 15-21.9   High: 22-29.9   Extreme: 30 and above       No flowsheet data found.

## 2018-05-12 NOTE — Progress Notes (Signed)
Paitent aler tand oriented, vss no complaints of pain.  Escorted out ofhospital via wheelchair by volunteers.  D/c piv and tele box.  Family at bedside.

## 2018-05-16 ENCOUNTER — Other Ambulatory Visit: Payer: Self-pay

## 2018-05-16 NOTE — Patient Outreach (Signed)
[facility]) Care Management  05/16/2018  Hayley Nolan 1927-07-01 [id number]   EMMI- General Discharge RED ON EMMI ALERT Day # 1 Date: 05/14/2018 Red Alert Reason:  Know who to call about changes in condition? No    Outreach attempt: spoke with patient.  She is able to verify HIPAA.  She states she is doing ok.  Discussed red alert.  She states she knows to call her PCP.  Reiterated with patient the importance of contacting PCP for any changes in condition and importance of follow up appointments.  Patient states that she has a follow up on Thursday and has transportation.  Patient reports she has all her medications and denies any problems with those.  Patient denies any further questions of concerns.     Plan: RN CM will close case.    Jone Baseman, RN, MSN [facility] Care Management Care Management Coordinator Direct Line [phone] Toll Free: [phone]  Fax: [phone]

## 2018-05-18 NOTE — Discharge Summary (Signed)
[facility] [place] NAME: Hayley Nolan    MR#:  [id number]  DATE OF BIRTH:  09/25/1927  DATE OF ADMISSION:  05/10/2018 ADMITTING PHYSICIAN: Harrie Foreman, MD  DATE OF DISCHARGE: 05/12/2018 12:39 PM  PRIMARY CARE PHYSICIAN: Kirk Ruths, MD   ADMISSION DIAGNOSIS:  Acute respiratory failure with hypoxia ([place]) [J96.01] Community acquired pneumonia of left lower lobe of lung ([place]) [J18.1]  DISCHARGE DIAGNOSIS:  Active Problems:   CAP (community acquired pneumonia)   SECONDARY DIAGNOSIS:   Past Medical History:  Diagnosis Date  . Asthma   . Cancer [facility]) 1999   Left breast pre cancerous- mastectomy with reconstruction  . Hypertension      ADMITTING HISTORY  Chief Complaint: Shortness of breath HPI: Patient with past medical history of hypertension, asthma and history of cancer status post mastectomy presents to the emergency department complaining of shortness of breath.  The patient states that her dyspnea began today and is associated with a nonproductive cough.  Chest x-ray showed left lower lobe infiltrate consistent with pneumonia.  The patient started on fraction and azithromycin prior to the emergency department staff calling the hospitalist service for admission.  HOSPITAL COURSE:   * Left lower lobe Pneumonia Started on IV antibiotics on admission.  Admitted to medical floor with telemetry monitoring. On day 2 patient had wheezing and we added steroids. Her cultures remain negative.  She was off oxygen.  Afebrile.  Normal WBC. By the day of discharge patient improved well being close to baseline with her breathing.  Discharged home with oral antibiotics and prednisone taper.  Inhaler albuterol.  * Asthma exacerbation due to PNA Started steroid Significant improvement  * HTN Continue home meds   Patient discharged home in stable condition.  All questions answered.  CONSULTS OBTAINED:    DRUG ALLERGIES:    Allergies  Allergen Reactions  . Cephalexin Other (See Comments)    Possible headache     DISCHARGE MEDICATIONS:   Allergies as of 05/12/2018      Reactions   Cephalexin Other (See Comments)   Possible headache       Medication List    TAKE these medications   Advair Diskus 250-50 MCG/DOSE Aepb Generic drug:  Fluticasone-Salmeterol Inhale 1 puff into the lungs every 12 (twelve) hours.   Cholecalciferol 25 MCG (1000 UT) tablet Take 1,000 Units by mouth daily.   guaiFENesin-dextromethorphan 100-10 MG/5ML syrup Commonly known as:  ROBITUSSIN DM Take 5 mLs by mouth every 4 (four) hours as needed for cough.   levofloxacin 500 MG tablet Commonly known as:  Levaquin Take 1 tablet (500 mg total) by mouth daily.   MULTIVITAL PLATINUM SILVER PO Take 1 tablet by mouth daily.   PreserVision AREDS Caps Take 1 capsule by mouth daily.   potassium chloride 10 MEQ tablet Commonly known as:  K-DUR,KLOR-CON Take 10 mEq by mouth 2 (two) times daily.   predniSONE 10 MG (21) Tbpk tablet Commonly known as:  STERAPRED UNI-PAK 21 TAB 6 tabs day 1 and taper 10 mg a day - 6 days   ProAir HFA 108 (90 Base) MCG/ACT inhaler Generic drug:  albuterol Inhale 2 puffs into the lungs every 4 (four) hours as needed for wheezing.   spironolactone 25 MG tablet Commonly known as:  ALDACTONE Take 25 mg by mouth daily.   Xalatan 0.005 % ophthalmic solution Generic drug:  latanoprost Apply 1 drop to eye at bedtime.  Today   VITAL SIGNS:  Blood pressure (!) 158/90, pulse 99, temperature 98 F (36.7 C), temperature source Oral, resp. rate 16, height 5\' 4"  (1.626 m), weight 61.3 kg, SpO2 95 %.  I/O:  No intake or output data in the 24 hours ending 05/18/18 1916  PHYSICAL EXAMINATION:  Physical Exam  GENERAL:  85 y.o.-year-old patient lying in the bed with no acute distress.  LUNGS: Normal breath sounds bilaterally, no wheezing, rales,rhonchi or crepitation. No use of accessory  muscles of respiration.  CARDIOVASCULAR: S1, S2 normal. No murmurs, rubs, or gallops.  ABDOMEN: Soft, non-tender, non-distended. Bowel sounds present. No organomegaly or mass.  NEUROLOGIC: Moves all 4 extremities. PSYCHIATRIC: The patient is alert and oriented x 3.  SKIN: No obvious rash, lesion, or ulcer.   DATA REVIEW:   CBC No results for input(s): WBC, HGB, HCT, PLT in the last 168 hours.  Chemistries  No results for input(s): NA, K, CL, CO2, GLUCOSE, BUN, CREATININE, CALCIUM, MG, AST, ALT, ALKPHOS, BILITOT in the last 168 hours.  Invalid input(s): GFRCGP  Cardiac Enzymes No results for input(s): TROPONINI in the last 168 hours.  Microbiology Results  No results found for this or any previous visit.  RADIOLOGY:  No results found.  Follow up with PCP in 1 week.  Management plans discussed with the patient, family and they are in agreement.  CODE STATUS:  Code Status History    Date Active Date Inactive Code Status Order ID Comments User Context   05/10/2018 1025 05/12/2018 1540 Full Code [id number]  Harrie Foreman, MD Inpatient    Advance Directive Documentation     Most Recent Value  Type of Advance Directive  Healthcare Power of Attorney, Living will  Pre-existing out of facility DNR order (yellow form or pink MOST form)  -  "MOST" Form in Place?  -      TOTAL TIME TAKING CARE OF THIS PATIENT ON DAY OF DISCHARGE: more than 30 minutes.   Hayley Nolan Hayley Nolan M.D on 05/18/2018 at 7:16 PM  Between 7am to 6pm - Pager - [phone]  After 6pm go to [email or web address] - password [email or web address] [facility]  Office  [phone]  CC: Primary care physician; Kirk Ruths, MD  Note: This dictation was prepared with Dragon dictation along with smaller phrase technology. Any transcriptional errors that result from this process are unintentional.

## 2018-05-19 DIAGNOSIS — J4521 Mild intermittent asthma with (acute) exacerbation: Secondary | ICD-10-CM | POA: Diagnosis not present

## 2018-07-04 DIAGNOSIS — H902 Conductive hearing loss, unspecified: Secondary | ICD-10-CM | POA: Diagnosis not present

## 2018-07-04 DIAGNOSIS — H6123 Impacted cerumen, bilateral: Secondary | ICD-10-CM | POA: Diagnosis not present

## 2018-07-05 DIAGNOSIS — H40053 Ocular hypertension, bilateral: Secondary | ICD-10-CM | POA: Diagnosis not present

## 2018-07-11 DIAGNOSIS — D1800 Hemangioma unspecified site: Secondary | ICD-10-CM | POA: Diagnosis not present

## 2018-07-11 DIAGNOSIS — L570 Actinic keratosis: Secondary | ICD-10-CM | POA: Diagnosis not present

## 2018-07-11 DIAGNOSIS — Z85828 Personal history of other malignant neoplasm of skin: Secondary | ICD-10-CM | POA: Diagnosis not present

## 2018-07-11 DIAGNOSIS — Z1283 Encounter for screening for malignant neoplasm of skin: Secondary | ICD-10-CM | POA: Diagnosis not present

## 2018-07-11 DIAGNOSIS — L820 Inflamed seborrheic keratosis: Secondary | ICD-10-CM | POA: Diagnosis not present

## 2018-07-11 DIAGNOSIS — L720 Epidermal cyst: Secondary | ICD-10-CM | POA: Diagnosis not present

## 2018-07-11 DIAGNOSIS — L821 Other seborrheic keratosis: Secondary | ICD-10-CM | POA: Diagnosis not present

## 2018-07-11 DIAGNOSIS — L812 Freckles: Secondary | ICD-10-CM | POA: Diagnosis not present

## 2018-08-23 DIAGNOSIS — N183 Chronic kidney disease, stage 3 (moderate): Secondary | ICD-10-CM | POA: Diagnosis not present

## 2018-08-23 DIAGNOSIS — I129 Hypertensive chronic kidney disease with stage 1 through stage 4 chronic kidney disease, or unspecified chronic kidney disease: Secondary | ICD-10-CM | POA: Diagnosis not present

## 2018-08-23 DIAGNOSIS — E7800 Pure hypercholesterolemia, unspecified: Secondary | ICD-10-CM | POA: Diagnosis not present

## 2018-08-30 DIAGNOSIS — N183 Chronic kidney disease, stage 3 (moderate): Secondary | ICD-10-CM | POA: Diagnosis not present

## 2018-08-30 DIAGNOSIS — E876 Hypokalemia: Secondary | ICD-10-CM | POA: Diagnosis not present

## 2018-08-30 DIAGNOSIS — I129 Hypertensive chronic kidney disease with stage 1 through stage 4 chronic kidney disease, or unspecified chronic kidney disease: Secondary | ICD-10-CM | POA: Diagnosis not present

## 2018-08-30 DIAGNOSIS — J4521 Mild intermittent asthma with (acute) exacerbation: Secondary | ICD-10-CM | POA: Diagnosis not present

## 2018-08-30 DIAGNOSIS — E7800 Pure hypercholesterolemia, unspecified: Secondary | ICD-10-CM | POA: Diagnosis not present

## 2018-11-08 ENCOUNTER — Ambulatory Visit
Admission: RE | Admit: 2018-11-08 | Discharge: 2018-11-08 | Disposition: A | Discharge status: Home / Self Care | Payer: PPO | Source: Ambulatory Visit | Attending: Internal Medicine | Admitting: Internal Medicine

## 2018-11-08 DIAGNOSIS — Z1231 Encounter for screening mammogram for malignant neoplasm of breast: Secondary | ICD-10-CM | POA: Diagnosis not present

## 2018-11-10 ENCOUNTER — Other Ambulatory Visit: Payer: Self-pay | Admitting: Internal Medicine

## 2018-11-10 ENCOUNTER — Other Ambulatory Visit: Payer: Self-pay

## 2018-11-10 DIAGNOSIS — R928 Other abnormal and inconclusive findings on diagnostic imaging of breast: Secondary | ICD-10-CM

## 2018-11-10 DIAGNOSIS — R921 Mammographic calcification found on diagnostic imaging of breast: Secondary | ICD-10-CM

## 2018-11-18 ENCOUNTER — Ambulatory Visit
Admission: RE | Admit: 2018-11-18 | Discharge: 2018-11-18 | Disposition: A | Discharge status: Home / Self Care | Payer: PPO | Source: Ambulatory Visit | Attending: Internal Medicine | Admitting: Internal Medicine

## 2018-11-18 DIAGNOSIS — R928 Other abnormal and inconclusive findings on diagnostic imaging of breast: Secondary | ICD-10-CM

## 2018-11-18 DIAGNOSIS — R921 Mammographic calcification found on diagnostic imaging of breast: Secondary | ICD-10-CM | POA: Diagnosis not present

## 2018-11-21 ENCOUNTER — Other Ambulatory Visit: Payer: Self-pay | Admitting: Internal Medicine

## 2018-11-21 DIAGNOSIS — R921 Mammographic calcification found on diagnostic imaging of breast: Secondary | ICD-10-CM

## 2018-11-21 DIAGNOSIS — R928 Other abnormal and inconclusive findings on diagnostic imaging of breast: Secondary | ICD-10-CM

## 2018-11-30 ENCOUNTER — Ambulatory Visit
Admission: RE | Admit: 2018-11-30 | Discharge: 2018-11-30 | Disposition: A | Discharge status: Home / Self Care | Payer: PPO | Source: Ambulatory Visit | Attending: Internal Medicine | Admitting: Internal Medicine

## 2018-11-30 ENCOUNTER — Other Ambulatory Visit: Payer: Self-pay | Admitting: Internal Medicine

## 2018-11-30 ENCOUNTER — Other Ambulatory Visit: Payer: Self-pay

## 2018-11-30 DIAGNOSIS — R921 Mammographic calcification found on diagnostic imaging of breast: Secondary | ICD-10-CM | POA: Diagnosis not present

## 2018-11-30 DIAGNOSIS — Z7689 Persons encountering health services in other specified circumstances: Secondary | ICD-10-CM | POA: Diagnosis not present

## 2018-11-30 DIAGNOSIS — R928 Other abnormal and inconclusive findings on diagnostic imaging of breast: Secondary | ICD-10-CM

## 2018-11-30 DIAGNOSIS — C50411 Malignant neoplasm of upper-outer quadrant of right female breast: Secondary | ICD-10-CM | POA: Diagnosis not present

## 2018-11-30 HISTORY — PX: BREAST BIOPSY: SHX20

## 2018-12-05 ENCOUNTER — Encounter: Payer: Self-pay | Admitting: *Deleted

## 2018-12-05 DIAGNOSIS — C50911 Malignant neoplasm of unspecified site of right female breast: Secondary | ICD-10-CM

## 2018-12-05 NOTE — Progress Notes (Signed)
Called patient to establish navigation services.  She is newly diagnosed with invasive right breast cancer.  She has a history or a left breast "pre-cancer" with mastectomy and reconstruction in the 1990's at [place] with Dr. Glendale Chard.  States she wants to stay her for her care this time.  She did not know of a surgeon, but her primary care provider, Dr. Tonette Bihari preference is Dr Bary Castilla.  I have scheduled her to see Dr. Bary Castilla on 12/06/18 @ 3:00.  She asked about Dr. Grayland Ormond and I explained he is the medical oncologist and scheduled her to see him on 12/15/18 @ 3:00.  I will have her pick up her educational literature at her appointment with Dr. Bary Castilla.  States she will have her daughter take her to her appointment tomorrow.  Offered to give the daughter her appointments, but patient declined.  She is to call with any questions or needs.

## 2018-12-06 ENCOUNTER — Other Ambulatory Visit: Payer: Self-pay | Admitting: General Surgery

## 2018-12-06 DIAGNOSIS — C50411 Malignant neoplasm of upper-outer quadrant of right female breast: Secondary | ICD-10-CM

## 2018-12-06 MED ORDER — LIDOCAINE-PRILOCAINE 2.5-2.5 % EX CREA: 1.0000 "application " | TOPICAL_CREAM | CUTANEOUS | 0 refills | Status: DC | PRN

## 2018-12-07 ENCOUNTER — Other Ambulatory Visit: Payer: Self-pay | Admitting: General Surgery

## 2018-12-07 DIAGNOSIS — C50411 Malignant neoplasm of upper-outer quadrant of right female breast: Secondary | ICD-10-CM

## 2018-12-08 ENCOUNTER — Other Ambulatory Visit: Payer: Self-pay

## 2018-12-08 ENCOUNTER — Other Ambulatory Visit: Payer: Self-pay | Admitting: General Surgery

## 2018-12-09 LAB — SURGICAL PATHOLOGY

## 2018-12-12 DIAGNOSIS — C50411 Malignant neoplasm of upper-outer quadrant of right female breast: Secondary | ICD-10-CM | POA: Insufficient documentation

## 2018-12-12 NOTE — Progress Notes (Deleted)
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  INTERVAL HISTORY: ***  REVIEW OF SYSTEMS:   ROS  As per HPI. Otherwise, a complete review of systems is negative.  PAST MEDICAL HISTORY: Past Medical History:  Diagnosis Date   Asthma    Cancer ([place]) 1999   Left breast pre cancerous- mastectomy with reconstruction   Hypertension     PAST SURGICAL HISTORY: Past Surgical History:  Procedure Laterality Date   AUGMENTATION MAMMAPLASTY Left 1999   BREAST BIOPSY Right 11/30/2018   affirm stereo bx of calcs, x marker, path pending   BREAST BIOPSY Right 11/30/2018   affirm bx of calcs, ribbon marker,path pending   MASTECTOMY Left 1999   Implant   REDUCTION MAMMAPLASTY Right 1997    FAMILY HISTORY: Family History  Problem Relation Age of Onset   Breast cancer Neg Hx     ADVANCED DIRECTIVES (Y/N):  N  HEALTH MAINTENANCE: Social History   Tobacco Use   Smoking status: Former Smoker   Smokeless tobacco: Never Used  Substance Use Topics   Alcohol use: Not on file   Drug use: Not on file     Colonoscopy:  PAP:  Bone density:  Lipid panel:  Allergies  Allergen Reactions   Cephalexin Other (See Comments)    Possible headache     Current Outpatient Medications  Medication Sig Dispense Refill   ADVAIR DISKUS 250-50 MCG/DOSE AEPB Inhale 1 puff into the lungs every 12 (twelve) hours.     Biotin (BIOTIN 5000) 5 MG CAPS Take 5 mg by mouth daily.     Cholecalciferol 25 MCG (1000 UT) tablet Take 1,000 Units by mouth daily.     guaiFENesin-dextromethorphan (ROBITUSSIN DM) 100-10 MG/5ML syrup Take 5  mLs by mouth every 4 (four) hours as needed for cough. (Patient not taking: Reported on 12/09/2018) 118 mL 0   latanoprost (XALATAN) 0.005 % ophthalmic solution Apply 1 drop to eye at bedtime.     levofloxacin (LEVAQUIN) 500 MG tablet Take 1 tablet (500 mg total) by mouth daily. (Patient not taking: Reported on 12/09/2018) 4 tablet 0   lidocaine-prilocaine (EMLA) cream Apply 1 application topically as needed. 5 g 0   Multiple Vitamins-Minerals (MULTIVITAL PLATINUM SILVER PO) Take 1 tablet by mouth daily.     Multiple Vitamins-Minerals (PRESERVISION AREDS) CAPS Take 1 capsule by mouth 2 (two) times daily.      potassium chloride (K-DUR,KLOR-CON) 10 MEQ tablet Take 10 mEq by mouth 2 (two) times daily.     predniSONE (STERAPRED UNI-PAK 21 TAB) 10 MG (21) TBPK tablet 6 tabs day 1 and taper 10 mg a day - 6 days (Patient not taking: Reported on 12/09/2018) 21 tablet 0   PROAIR HFA 108 (90 Base) MCG/ACT inhaler Inhale 2 puffs into the lungs every 4 (four) hours as needed for wheezing.     spironolactone (ALDACTONE) 25 MG tablet Take 25 mg by mouth daily.     No current facility-administered medications for this visit.     OBJECTIVE: There were no vitals filed for this visit.   There is no height or weight on file to calculate BMI.    ECOG FS:{CHL ONC  [id number]  General: Well-developed, well-nourished, no acute distress. Eyes: Pink conjunctiva, anicteric sclera. HEENT: Normocephalic, moist mucous membranes, clear oropharnyx. Lungs: Clear to auscultation bilaterally. Heart: Regular rate and rhythm. No rubs, murmurs, or gallops. Abdomen: Soft, nontender, nondistended. No organomegaly noted, normoactive bowel sounds. Musculoskeletal: No edema, cyanosis, or clubbing. Neuro: Alert, answering all questions appropriately. Cranial nerves grossly intact. Skin: No rashes or petechiae noted. Psych: Normal affect. Lymphatics: No cervical, calvicular, axillary or inguinal LAD.   LAB  RESULTS:  Lab Results  Component Value Date   NA 142 05/11/2018   K 3.2 (L) 05/11/2018   CL 110 05/11/2018   CO2 23 05/11/2018   GLUCOSE 110 (H) 05/11/2018   BUN 18 05/11/2018   CREATININE 0.62 05/11/2018   CALCIUM 8.9 05/11/2018   GFRNONAA >60 05/11/2018   GFRAA >60 05/11/2018    Lab Results  Component Value Date   WBC 7.2 05/10/2018   HGB 12.9 05/10/2018   HCT 39.5 05/10/2018   MCV 88.8 05/10/2018   PLT 216 05/10/2018     STUDIES: Mm Digital Diagnostic Unilat R  Result Date: 11/18/2018 CLINICAL DATA:  Recall from screening mammography with tomosynthesis, calcifications involving the RIGHT breast.Personal history of malignant LEFT mastectomy in 1999 and RIGHT breast reduction mammoplasty. EXAM: DIGITAL DIAGNOSTIC UNILATERAL RIGHT MAMMOGRAM WITH CAD AND TOMO COMPARISON:  Previous exam(s). ACR Breast Density Category d: The breast tissue is extremely dense, which lowers the sensitivity of mammography. FINDINGS: Standard spot magnification CC and mediolateral views of the RIGHT breast calcifications and a standard 2D and tomosynthesis full field mediolateral view of the RIGHT breast were obtained. Spot magnification views confirm loosely grouped fine pleomorphic calcifications involving the LOWER OUTER QUADRANT at MIDDLE depth. These calcifications are located between benign coarse dystrophic calcifications which are scattered throughout the RIGHT breast. There is a group of fine linear calcifications at the posterolateral extent of this loose group which is immediately adjacent to a benign coarse dystrophic calcification. The entire loosely grouped calcifications span approximately 2.0 x 2.2 x 2.7 cm. No suspicious findings elsewhere in the RIGHT breast on the full field mediolateral tomosynthesis images. Mammographic images were processed with CAD. IMPRESSION: Indeterminate loosely grouped calcifications involving the LOWER OUTER QUADRANT of the RIGHT breast at MIDDLE depth. At the  posterolateral extent of the group is a group of fine linear calcifications. RECOMMENDATION: Stereotactic tomosynthesis core needle biopsy of the indeterminate RIGHT breast calcifications. The stereotactic tomosynthesis core biopsy procedure was discussed with the patient and her questions were answered. She wishes to discuss the procedure with her daughter prior to committing to the biopsy. The staff at [facility] will contact the patient regarding her decision as to whether to proceed with the biopsy. If she elects not to proceed with biopsy, diagnostic RIGHT mammogram with spot magnification views of the RIGHT breast calcifications would be recommended in 6 months. I have discussed the findings and recommendations with the patient. If applicable, a reminder letter will be sent to the patient regarding the next appointment. BI-RADS CATEGORY  4: Suspicious. Electronically Signed   By: Evangeline Dakin M.D.   On: 11/18/2018 09:14   Mm Clip Placement Right  Result Date: 11/30/2018 CLINICAL DATA:  Fine linear calcifications and pleomorphic calcifications in the lateral right breast. EXAM: DIAGNOSTIC RIGHT MAMMOGRAM POST STEREOTACTIC BIOPSY COMPARISON:  Previous exam(s). FINDINGS: Mammographic images were obtained following stereotactic guided biopsy of fine linear calcifications and pleomorphic calcifications in the outer right breast. An X shaped biopsy marking clip is appropriately positioned  in the upper-outer quadrant of the right breast at the site of fine linear calcifications. A ribbon shaped biopsy marking clip is propria lead positioned in the lower outer quadrant of the right breast at the site of fine pleomorphic calcifications. IMPRESSION: Appropriately positioned X shaped and ribbon shaped biopsy marking clips at the sites of biopsy in the right breast. Final Assessment: Post Procedure Mammograms for Marker Placement Electronically Signed   By: Zerita Boers M.D.   On: 11/30/2018 14:20    Mm Rt Breast Bx W Loc Dev 1st Lesion Image Bx Spec Stereo Guide  Addendum Date: 12/05/2018   ADDENDUM REPORT: 12/05/2018 11:38 ADDENDUM: PATHOLOGY revealed: A. RIGHT BREAST, UOQ; STEREOTACTIC NEEDLE CORE BIOPSY: - INVASIVE MAMMARY CARCINOMA, NO SPECIAL TYPE. 4 mm in this sample. Grade 2 at least. Ductal carcinoma in situ: Present, high-grade. B. RIGHT BREAST, LOQ; STEREOTACTIC NEEDLE CORE BIOPSY: - HIGH-GRADE DUCTAL CARCINOMA IN SITU, FOCAL. - SEE COMMENT. While the findings in specimen B do not meet size criteria to be classified as DCIS, they are morphologically similar and felt to represent the same process as seen in specimen A. Pathology results are CONCORDANT with imaging findings, per Dr. Zerita Boers. Of note, the area of calcifications spans 3.4 cm on the ML magnification view dated 11/18/2018. Pathology results were discussed with patient via telephone. The patient reported doing well after the biopsy with tenderness at the site. Post biopsy care instructions were reviewed and questions were answered. The patient was encouraged to call [facility] for any additional concerns. Recommendation: Surgical referral. Request for surgical referral was relayed to nurse navigators at [facility] by Electa Sniff RN on 12/05/2018. Patient has appointment with Dr. Ollen Bowl on 12/06/2018. Addendum by Electa Sniff RN on 12/05/2018. Electronically Signed   By: Zerita Boers M.D.   On: 12/05/2018 11:38   Result Date: 12/05/2018 CLINICAL DATA:  Loosely grouped fine pleomorphic calcifications in the lower outer right breast. EXAM: RIGHT BREAST STEREOTACTIC CORE NEEDLE BIOPSY COMPARISON:  Previous exams. FINDINGS: Given that there are both fine linear calcifications and fine pleomorphic calcification within the previously described group of microcalcifications in the lower outer right breast, the decision was made in conjunction with the patient to perform two biopsies; 1 of the fine  linear calcifications located superolaterally and just posterior to a coarse popcorn calcification and 1 of the fine pleomorphic calcifications located more anterior, inferior, and medial. The patient and I discussed the procedure of stereotactic-guided biopsy including benefits and alternatives. We discussed the high likelihood of a successful procedure. We discussed the risks of the procedure including infection, bleeding, tissue injury, clip migration, and inadequate sampling. Informed written consent was given. The usual time out protocol was performed immediately prior to the procedure. Site 1- Using sterile technique and 1% Lidocaine as local anesthetic, under stereotactic guidance, a 9 gauge vacuum assisted device was used to perform core needle biopsy of fine linear calcifications in the upper outer right breast using a lateral approach. Specimen radiograph was performed showing sampling of representative calcifications. Specimens with calcifications are identified for pathology. Lesion quadrant: Upper outer quadrant At the conclusion of the procedure, an X shaped tissue marker clip was deployed into the biopsy cavity. Follow-up 2-view mammogram was performed and dictated separately. Site 2- Using sterile technique and 1% Lidocaine as local anesthetic, under stereotactic guidance, a 9 gauge vacuum assisted device was used to perform core needle biopsy of fine pleomorphic calcifications in the lower outer right breast using a  lateral approach. Specimen radiograph was performed showing sampling of representative calcifications. Specimens with calcifications are identified for pathology. Lesion quadrant: Lower outer quadrant At the conclusion of the procedure, a ribbon shaped tissue marker clip was deployed into the biopsy cavity. Follow-up 2-view mammogram was performed and dictated separately. IMPRESSION: Stereotactic-guided two-site biopsy of microcalcifications in the lateral right breast. No apparent  complications. Electronically Signed: By: Zerita Boers M.D. On: 11/30/2018 11:21   Mm Rt Breast Bx W Loc Dev Ea Ad Lesion Img Bx Spec Stereo Guide  Addendum Date: 12/05/2018   ADDENDUM REPORT: 12/05/2018 11:38 ADDENDUM: PATHOLOGY revealed: A. RIGHT BREAST, UOQ; STEREOTACTIC NEEDLE CORE BIOPSY: - INVASIVE MAMMARY CARCINOMA, NO SPECIAL TYPE. 4 mm in this sample. Grade 2 at least. Ductal carcinoma in situ: Present, high-grade. B. RIGHT BREAST, LOQ; STEREOTACTIC NEEDLE CORE BIOPSY: - HIGH-GRADE DUCTAL CARCINOMA IN SITU, FOCAL. - SEE COMMENT. While the findings in specimen B do not meet size criteria to be classified as DCIS, they are morphologically similar and felt to represent the same process as seen in specimen A. Pathology results are CONCORDANT with imaging findings, per Dr. Zerita Boers. Of note, the area of calcifications spans 3.4 cm on the ML magnification view dated 11/18/2018. Pathology results were discussed with patient via telephone. The patient reported doing well after the biopsy with tenderness at the site. Post biopsy care instructions were reviewed and questions were answered. The patient was encouraged to call [facility] for any additional concerns. Recommendation: Surgical referral. Request for surgical referral was relayed to nurse navigators at [facility] by Electa Sniff RN on 12/05/2018. Patient has appointment with Dr. Ollen Bowl on 12/06/2018. Addendum by Electa Sniff RN on 12/05/2018. Electronically Signed   By: Zerita Boers M.D.   On: 12/05/2018 11:38   Result Date: 12/05/2018 CLINICAL DATA:  Loosely grouped fine pleomorphic calcifications in the lower outer right breast. EXAM: RIGHT BREAST STEREOTACTIC CORE NEEDLE BIOPSY COMPARISON:  Previous exams. FINDINGS: Given that there are both fine linear calcifications and fine pleomorphic calcification within the previously described group of microcalcifications in the lower outer right breast, the decision  was made in conjunction with the patient to perform two biopsies; 1 of the fine linear calcifications located superolaterally and just posterior to a coarse popcorn calcification and 1 of the fine pleomorphic calcifications located more anterior, inferior, and medial. The patient and I discussed the procedure of stereotactic-guided biopsy including benefits and alternatives. We discussed the high likelihood of a successful procedure. We discussed the risks of the procedure including infection, bleeding, tissue injury, clip migration, and inadequate sampling. Informed written consent was given. The usual time out protocol was performed immediately prior to the procedure. Site 1- Using sterile technique and 1% Lidocaine as local anesthetic, under stereotactic guidance, a 9 gauge vacuum assisted device was used to perform core needle biopsy of fine linear calcifications in the upper outer right breast using a lateral approach. Specimen radiograph was performed showing sampling of representative calcifications. Specimens with calcifications are identified for pathology. Lesion quadrant: Upper outer quadrant At the conclusion of the procedure, an X shaped tissue marker clip was deployed into the biopsy cavity. Follow-up 2-view mammogram was performed and dictated separately. Site 2- Using sterile technique and 1% Lidocaine as local anesthetic, under stereotactic guidance, a 9 gauge vacuum assisted device was used to perform core needle biopsy of fine pleomorphic calcifications in the lower outer right breast using a lateral approach. Specimen radiograph was performed showing sampling of  representative calcifications. Specimens with calcifications are identified for pathology. Lesion quadrant: Lower outer quadrant At the conclusion of the procedure, a ribbon shaped tissue marker clip was deployed into the biopsy cavity. Follow-up 2-view mammogram was performed and dictated separately. IMPRESSION: Stereotactic-guided  two-site biopsy of microcalcifications in the lateral right breast. No apparent complications. Electronically Signed: By: Zerita Boers M.D. 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  PLAN:    Patient expressed understanding and was in agreement with this plan. She also understands that She can call clinic at any time with any questions, concerns, or complaints.   Cancer Staging No matching staging information was found for the patient.  Lloyd Huger, MD   12/12/2018 7:50 PM

## 2018-12-13 ENCOUNTER — Encounter
Admission: RE | Admit: 2018-12-13 | Discharge: 2018-12-13 | Disposition: A | Discharge status: Home / Self Care | Payer: PPO | Source: Ambulatory Visit | Attending: Internal Medicine | Admitting: Internal Medicine

## 2018-12-13 ENCOUNTER — Other Ambulatory Visit: Payer: Self-pay

## 2018-12-13 DIAGNOSIS — Z20828 Contact with and (suspected) exposure to other viral communicable diseases: Secondary | ICD-10-CM | POA: Insufficient documentation

## 2018-12-13 DIAGNOSIS — I10 Essential (primary) hypertension: Secondary | ICD-10-CM | POA: Diagnosis not present

## 2018-12-13 DIAGNOSIS — Z01818 Encounter for other preprocedural examination: Secondary | ICD-10-CM | POA: Insufficient documentation

## 2018-12-13 DIAGNOSIS — I491 Atrial premature depolarization: Secondary | ICD-10-CM | POA: Insufficient documentation

## 2018-12-13 LAB — BASIC METABOLIC PANEL
Anion gap: 11 (ref 5–15)
BUN: 19 mg/dL (ref 8–23)
CO2: 28 mmol/L (ref 22–32)
Calcium: 9.6 mg/dL (ref 8.9–10.3)
Chloride: 102 mmol/L (ref 98–111)
Creatinine, Ser: 0.8 mg/dL (ref 0.44–1.00)
GFR calc Af Amer: >60 mL/min (ref >60)
GFR calc non Af Amer: >60 mL/min (ref >60)
Glucose, Bld: 93 mg/dL (ref 70–99)
Potassium: 3.8 mmol/L (ref 3.5–5.1)
Sodium: 141 mmol/L (ref 135–145)

## 2018-12-13 NOTE — Patient Instructions (Signed)
Your procedure is scheduled on: Friday 10/16 Report to Mammography.  @ 7:45  Remember: Instructions that are not followed completely may result in serious medical risk,  up to and including death, or upon the discretion of your surgeon and anesthesiologist your  surgery may need to be rescheduled.     _X__ 1. Do not eat food after midnight the night before your procedure.                 No gum chewing or hard candies. You may drink clear liquids up to 2 hours                 before you are scheduled to arrive for your surgery- DO not drink clear                 liquids within 2 hours of the start of your surgery.                 Clear Liquids include:  water, apple juice without pulp, clear carbohydrate                 drink such as Clearfast of Gatorade, Black Coffee or Tea (Do not add                 anything to coffee or tea).  __X__2.  On the morning of surgery brush your teeth with toothpaste and water, you                may rinse your mouth with mouthwash if you wish.  Do not swallow any toothpaste of mouthwash.     ___ 3.  No Alcohol for 24 hours before or after surgery.   ___ 4.  Do Not Smoke or use e-cigarettes For 24 Hours Prior to Your Surgery.                 Do not use any chewable tobacco products for at least 6 hours prior to                 surgery.  ____  5.  Bring all medications with you on the day of surgery if instructed.   _x___  6.  Notify your doctor if there is any change in your medical condition      (cold, fever, infections).     Do not wear jewelry, make-up, hairpins, clips or nail polish. Do not wear lotions, powders, or perfumes. You may wear deodorant. Do not shave 48 hours prior to surgery. Men may shave face and neck. Do not bring valuables to the hospital.    [facility] is not responsible for any belongings or valuables.  Contacts, dentures or bridgework may not be worn into surgery. Leave your suitcase in the car.  After surgery it may be brought to your room. For patients admitted to the hospital, discharge time is determined by your treatment team.   Patients discharged the day of surgery will not be allowed to drive home.   Please read over the following fact sheets that you were given:     ____ Take these medicines the morning of surgery with A SIP OF WATER:    1. none  2.   3.   4.  5.  6.  ____ Fleet Enema (as directed)   __x__ Use CHG Soap as directed  __x__ Use inhalers on the day of surgery ADVAIR DISKUS 250-50 MCG/DOSE AEPB,  ADVAIR DISKUS 250-50 MCG/DOSE AEPB and bring  with you  ____ Stop metformin 2 days prior to surgery    ____ Take 1/2 of usual insulin dose the night before surgery. No insulin the morning          of surgery.   ____ Stop Coumadin/Plavix/aspirin  __x__ Stop Anti-inflammatories No ibuprofen aleve or aspirin until after the surgery   _x___ Stop supplements until after surgery.Biotin (BIOTIN 5000) 5 MG CAPS    ____ Bring C-Pap to the hospital.

## 2018-12-14 LAB — SARS CORONAVIRUS 2 (TAT 6-24 HRS): SARS Coronavirus 2: NEGATIVE

## 2018-12-15 ENCOUNTER — Inpatient Hospital Stay: Payer: PPO | Admitting: Oncology

## 2018-12-16 ENCOUNTER — Ambulatory Visit
Admission: RE | Admit: 2018-12-16 | Discharge: 2018-12-16 | Disposition: A | Discharge status: Home / Self Care | Payer: PPO | Source: Ambulatory Visit | Attending: General Surgery | Admitting: General Surgery

## 2018-12-16 ENCOUNTER — Encounter
Admission: RE | Disposition: A | Discharge status: Home / Self Care | Payer: Self-pay | Source: Home / Self Care | Attending: General Surgery

## 2018-12-16 ENCOUNTER — Ambulatory Visit: Payer: PPO | Admitting: Certified Registered Nurse Anesthetist

## 2018-12-16 ENCOUNTER — Ambulatory Visit
Admission: RE | Admit: 2018-12-16 | Discharge: 2018-12-16 | Disposition: A | Discharge status: Home / Self Care | Payer: PPO | Attending: General Surgery | Admitting: General Surgery

## 2018-12-16 ENCOUNTER — Encounter: Payer: Self-pay | Admitting: *Deleted

## 2018-12-16 ENCOUNTER — Encounter
Admission: RE | Admit: 2018-12-16 | Discharge: 2018-12-16 | Disposition: A | Discharge status: Home / Self Care | Payer: PPO | Source: Ambulatory Visit | Attending: General Surgery | Admitting: General Surgery

## 2018-12-16 ENCOUNTER — Other Ambulatory Visit: Payer: Self-pay

## 2018-12-16 DIAGNOSIS — Z853 Personal history of malignant neoplasm of breast: Secondary | ICD-10-CM | POA: Diagnosis not present

## 2018-12-16 DIAGNOSIS — C50411 Malignant neoplasm of upper-outer quadrant of right female breast: Secondary | ICD-10-CM

## 2018-12-16 DIAGNOSIS — J45909 Unspecified asthma, uncomplicated: Secondary | ICD-10-CM | POA: Insufficient documentation

## 2018-12-16 DIAGNOSIS — Z881 Allergy status to other antibiotic agents status: Secondary | ICD-10-CM | POA: Diagnosis not present

## 2018-12-16 DIAGNOSIS — Z79899 Other long term (current) drug therapy: Secondary | ICD-10-CM | POA: Diagnosis not present

## 2018-12-16 DIAGNOSIS — D0511 Intraductal carcinoma in situ of right breast: Secondary | ICD-10-CM | POA: Diagnosis not present

## 2018-12-16 DIAGNOSIS — Z7951 Long term (current) use of inhaled steroids: Secondary | ICD-10-CM | POA: Diagnosis not present

## 2018-12-16 DIAGNOSIS — I10 Essential (primary) hypertension: Secondary | ICD-10-CM | POA: Diagnosis not present

## 2018-12-16 DIAGNOSIS — C50911 Malignant neoplasm of unspecified site of right female breast: Secondary | ICD-10-CM | POA: Diagnosis not present

## 2018-12-16 DIAGNOSIS — Z9012 Acquired absence of left breast and nipple: Secondary | ICD-10-CM | POA: Insufficient documentation

## 2018-12-16 DIAGNOSIS — C50511 Malignant neoplasm of lower-outer quadrant of right female breast: Secondary | ICD-10-CM | POA: Diagnosis not present

## 2018-12-16 HISTORY — PX: BREAST LUMPECTOMY WITH NEEDLE LOCALIZATION AND AXILLARY SENTINEL LYMPH NODE BX: SHX5760

## 2018-12-16 HISTORY — PX: BREAST LUMPECTOMY: SHX2

## 2018-12-16 SURGERY — BREAST LUMPECTOMY WITH NEEDLE LOCALIZATION AND AXILLARY SENTINEL LYMPH NODE BX
Anesthesia: General | Site: Breast | Laterality: Right

## 2018-12-16 MED ORDER — FAMOTIDINE 20 MG PO TABS
20.0000 mg | ORAL_TABLET | Freq: Once | ORAL | Status: AC
  Administered 2018-12-16: 20 mg via ORAL

## 2018-12-16 MED ORDER — GLYCOPYRROLATE 0.2 MG/ML IJ SOLN
INTRAMUSCULAR | Status: AC
  Filled 2018-12-16: qty 1

## 2018-12-16 MED ORDER — FAMOTIDINE 20 MG PO TABS
ORAL_TABLET | ORAL | Status: AC
  Filled 2018-12-16: qty 1

## 2018-12-16 MED ORDER — METHYLENE BLUE 0.5 % INJ SOLN
INTRAVENOUS | Status: AC
  Filled 2018-12-16: qty 10

## 2018-12-16 MED ORDER — DEXAMETHASONE SODIUM PHOSPHATE 10 MG/ML IJ SOLN
INTRAMUSCULAR | Status: DC | PRN
  Administered 2018-12-16: 5 mg via INTRAVENOUS

## 2018-12-16 MED ORDER — PROPOFOL 10 MG/ML IV BOLUS
INTRAVENOUS | Status: DC | PRN
  Administered 2018-12-16: 30 mg via INTRAVENOUS
  Administered 2018-12-16: 70 mg via INTRAVENOUS

## 2018-12-16 MED ORDER — CHLORHEXIDINE GLUCONATE CLOTH 2 % EX PADS: 6.0000 | MEDICATED_PAD | Freq: Once | CUTANEOUS | Status: DC

## 2018-12-16 MED ORDER — TRAMADOL HCL 50 MG PO TABS: 50.0000 mg | ORAL_TABLET | Freq: Four times a day (QID) | ORAL | 0 refills | Status: DC | PRN

## 2018-12-16 MED ORDER — BUPIVACAINE-EPINEPHRINE (PF) 0.5% -1:200000 IJ SOLN
INTRAMUSCULAR | Status: AC
  Filled 2018-12-16: qty 30

## 2018-12-16 MED ORDER — PROPOFOL 10 MG/ML IV BOLUS
INTRAVENOUS | Status: AC
  Filled 2018-12-16: qty 20

## 2018-12-16 MED ORDER — KETOROLAC TROMETHAMINE 30 MG/ML IJ SOLN
INTRAMUSCULAR | Status: AC
  Filled 2018-12-16: qty 1

## 2018-12-16 MED ORDER — LIDOCAINE HCL (PF) 2 % IJ SOLN
INTRAMUSCULAR | Status: AC
  Filled 2018-12-16: qty 10

## 2018-12-16 MED ORDER — TECHNETIUM TC 99M SULFUR COLLOID FILTERED
0.8400 | Freq: Once | INTRAVENOUS | Status: AC | PRN
  Administered 2018-12-16: 0.84 via INTRADERMAL

## 2018-12-16 MED ORDER — LACTATED RINGERS IV SOLN
INTRAVENOUS | Status: DC
  Administered 2018-12-16: 10:00:00 via INTRAVENOUS

## 2018-12-16 MED ORDER — ACETAMINOPHEN 10 MG/ML IV SOLN
INTRAVENOUS | Status: AC
  Filled 2018-12-16: qty 100

## 2018-12-16 MED ORDER — KETOROLAC TROMETHAMINE 30 MG/ML IJ SOLN
INTRAMUSCULAR | Status: DC | PRN
  Administered 2018-12-16: 7.5 mg via INTRAVENOUS

## 2018-12-16 MED ORDER — FENTANYL CITRATE (PF) 100 MCG/2ML IJ SOLN
INTRAMUSCULAR | Status: DC | PRN
  Administered 2018-12-16: 25 ug via INTRAVENOUS
  Administered 2018-12-16: 50 ug via INTRAVENOUS
  Administered 2018-12-16 (×5): 25 ug via INTRAVENOUS

## 2018-12-16 MED ORDER — FENTANYL CITRATE (PF) 100 MCG/2ML IJ SOLN
INTRAMUSCULAR | Status: AC
  Filled 2018-12-16: qty 2

## 2018-12-16 MED ORDER — ONDANSETRON HCL 4 MG/2ML IJ SOLN
INTRAMUSCULAR | Status: DC | PRN
  Administered 2018-12-16: 4 mg via INTRAVENOUS

## 2018-12-16 MED ORDER — ONDANSETRON HCL 4 MG/2ML IJ SOLN
INTRAMUSCULAR | Status: AC
  Filled 2018-12-16: qty 2

## 2018-12-16 MED ORDER — EPHEDRINE SULFATE 50 MG/ML IJ SOLN
INTRAMUSCULAR | Status: AC
  Filled 2018-12-16: qty 1

## 2018-12-16 MED ORDER — DEXAMETHASONE SODIUM PHOSPHATE 10 MG/ML IJ SOLN
INTRAMUSCULAR | Status: AC
  Filled 2018-12-16: qty 1

## 2018-12-16 MED ORDER — BUPIVACAINE-EPINEPHRINE (PF) 0.5% -1:200000 IJ SOLN
INTRAMUSCULAR | Status: DC | PRN
  Administered 2018-12-16: 20 mL via PERINEURAL

## 2018-12-16 MED ORDER — PHENYLEPHRINE HCL (PRESSORS) 10 MG/ML IV SOLN
INTRAVENOUS | Status: DC | PRN
  Administered 2018-12-16: 100 ug via INTRAVENOUS
  Administered 2018-12-16 (×2): 50 ug via INTRAVENOUS
  Administered 2018-12-16 (×4): 100 ug via INTRAVENOUS
  Administered 2018-12-16: 50 ug via INTRAVENOUS

## 2018-12-16 MED ORDER — ACETAMINOPHEN 10 MG/ML IV SOLN
INTRAVENOUS | Status: DC | PRN
  Administered 2018-12-16: 1000 mg via INTRAVENOUS

## 2018-12-16 MED ORDER — GLYCOPYRROLATE 0.2 MG/ML IJ SOLN
INTRAMUSCULAR | Status: DC | PRN
  Administered 2018-12-16: 0.2 mg via INTRAVENOUS

## 2018-12-16 MED ORDER — LIDOCAINE HCL (CARDIAC) PF 100 MG/5ML IV SOSY
PREFILLED_SYRINGE | INTRAVENOUS | Status: DC | PRN
  Administered 2018-12-16: 60 mg via INTRAVENOUS

## 2018-12-16 MED ORDER — METHYLENE BLUE 0.5 % INJ SOLN
INTRAVENOUS | Status: DC | PRN
  Administered 2018-12-16: 2 mL via SUBMUCOSAL

## 2018-12-16 SURGICAL SUPPLY — 54 items
BINDER BREAST LRG (GAUZE/BANDAGES/DRESSINGS) IMPLANT
BINDER BREAST MEDIUM (GAUZE/BANDAGES/DRESSINGS) ×2 IMPLANT
BINDER BREAST XLRG (GAUZE/BANDAGES/DRESSINGS) IMPLANT
BINDER BREAST XXLRG (GAUZE/BANDAGES/DRESSINGS) IMPLANT
BLADE SURG 15 STRL SS SAFETY (BLADE) ×4 IMPLANT
BULB RESERV EVAC DRAIN JP 100C (MISCELLANEOUS) IMPLANT
CANISTER SUCT 1200ML W/VALVE (MISCELLANEOUS) ×2 IMPLANT
CHLORAPREP W/TINT 26 (MISCELLANEOUS) ×2 IMPLANT
CNTNR SPEC 2.5X3XGRAD LEK (MISCELLANEOUS)
CONT SPEC 4OZ STER OR WHT (MISCELLANEOUS)
CONTAINER SPEC 2.5X3XGRAD LEK (MISCELLANEOUS) IMPLANT
COVER PROBE FLX POLY STRL (MISCELLANEOUS) ×2 IMPLANT
COVER WAND RF STERILE (DRAPES) ×2 IMPLANT
DEVICE DUBIN SPECIMEN MAMMOGRA (MISCELLANEOUS) ×2 IMPLANT
DRAIN CHANNEL JP 15F RND 16 (MISCELLANEOUS) IMPLANT
DRAPE LAPAROTOMY TRNSV 106X77 (MISCELLANEOUS) ×2 IMPLANT
DRSG GAUZE FLUFF 36X18 (GAUZE/BANDAGES/DRESSINGS) ×4 IMPLANT
DRSG TELFA 3X8 NADH (GAUZE/BANDAGES/DRESSINGS) ×4 IMPLANT
ELECT CAUTERY BLADE TIP 2.5 (TIP) ×2
ELECT REM PT RETURN 9FT ADLT (ELECTROSURGICAL) ×2
ELECTRODE CAUTERY BLDE TIP 2.5 (TIP) ×1 IMPLANT
ELECTRODE REM PT RTRN 9FT ADLT (ELECTROSURGICAL) ×1 IMPLANT
GAUZE SPONGE 4X4 12PLY STRL (GAUZE/BANDAGES/DRESSINGS) ×2 IMPLANT
GLOVE BIO SURGEON STRL SZ7.5 (GLOVE) ×2 IMPLANT
GLOVE INDICATOR 8.0 STRL GRN (GLOVE) ×2 IMPLANT
GOWN STRL REUS W/ TWL LRG LVL3 (GOWN DISPOSABLE) ×2 IMPLANT
GOWN STRL REUS W/TWL LRG LVL3 (GOWN DISPOSABLE) ×2
KIT TURNOVER KIT A (KITS) ×2 IMPLANT
LABEL OR SOLS (LABEL) ×2 IMPLANT
MARGIN MAP 10MM (MISCELLANEOUS) ×2 IMPLANT
NEEDLE HYPO 22GX1.5 SAFETY (NEEDLE) ×2 IMPLANT
NEEDLE HYPO 25X1 1.5 SAFETY (NEEDLE) ×4 IMPLANT
PACK BASIN MINOR ARMC (MISCELLANEOUS) ×2 IMPLANT
RETRACTOR RING XSMALL (MISCELLANEOUS) ×1 IMPLANT
RTRCTR WOUND ALEXIS 13CM XS SH (MISCELLANEOUS) ×2
SHEARS FOC LG CVD HARMONIC 17C (MISCELLANEOUS) IMPLANT
SHEARS HARMONIC 9CM CVD (BLADE) ×2 IMPLANT
SLEVE PROBE SENORX GAMMA FIND (MISCELLANEOUS) ×2 IMPLANT
STRIP CLOSURE SKIN 1/2X4 (GAUZE/BANDAGES/DRESSINGS) ×2 IMPLANT
SUT ETHILON 3-0 FS-10 30 BLK (SUTURE) ×2
SUT SILK 2 0 (SUTURE) ×1
SUT SILK 2-0 18XBRD TIE 12 (SUTURE) ×1 IMPLANT
SUT VIC AB 2-0 CT1 27 (SUTURE) ×3
SUT VIC AB 2-0 CT1 TAPERPNT 27 (SUTURE) ×3 IMPLANT
SUT VIC AB 3-0 SH 27 (SUTURE) ×2
SUT VIC AB 3-0 SH 27X BRD (SUTURE) ×2 IMPLANT
SUT VIC AB 4-0 FS2 27 (SUTURE) ×4 IMPLANT
SUT VICRYL+ 3-0 144IN (SUTURE) ×2 IMPLANT
SUTURE EHLN 3-0 FS-10 30 BLK (SUTURE) ×1 IMPLANT
SWABSTK COMLB BENZOIN TINCTURE (MISCELLANEOUS) ×2 IMPLANT
SYR 10ML LL (SYRINGE) ×2 IMPLANT
SYR BULB IRRIG 60ML STRL (SYRINGE) ×2 IMPLANT
TAPE TRANSPORE STRL 2 31045 (GAUZE/BANDAGES/DRESSINGS) ×2 IMPLANT
WATER STERILE IRR 1000ML POUR (IV SOLUTION) ×2 IMPLANT

## 2018-12-16 NOTE — Anesthesia Preprocedure Evaluation (Signed)
Anesthesia Evaluation  Patient identified by MRN, date of birth, ID band Patient awake    Reviewed: Allergy & Precautions, NPO status , Patient's Chart, lab work & pertinent test results  History of Anesthesia Complications Negative for: history of anesthetic complications  Airway Mallampati: II  TM Distance: >3 FB Neck ROM: Full    Dental  (+) Poor Dentition   Pulmonary asthma , neg sleep apnea,    breath sounds clear to auscultation- rhonchi (-) wheezing      Cardiovascular hypertension, Pt. on medications (-) CAD, (-) Past MI, (-) Cardiac Stents and (-) CABG  Rhythm:Regular Rate:Normal - Systolic murmurs and - Diastolic murmurs    Neuro/Psych neg Seizures negative neurological ROS  negative psych ROS   GI/Hepatic negative GI ROS, Neg liver ROS,   Endo/Other  negative endocrine ROSneg diabetes  Renal/GU negative Renal ROS     Musculoskeletal negative musculoskeletal ROS (+)   Abdominal (+) - obese,   Peds  Hematology negative hematology ROS (+)   Anesthesia Other Findings Past Medical History: No date: Asthma 1999: Cancer ([place])     Comment:  Left breast pre cancerous- mastectomy with               reconstruction No date: Hypertension   Reproductive/Obstetrics                             Anesthesia Physical Anesthesia Plan  ASA: II  Anesthesia Plan: General   Post-op Pain Management:    Induction: Intravenous  PONV Risk Score and Plan: 2 and Ondansetron and Dexamethasone  Airway Management Planned: LMA  Additional Equipment:   Intra-op Plan:   Post-operative Plan:   Informed Consent: I have reviewed the patients History and Physical, chart, labs and discussed the procedure including the risks, benefits and alternatives for the proposed anesthesia with the patient or authorized representative who has indicated his/her understanding and acceptance.     Dental advisory  given  Plan Discussed with: CRNA and Anesthesiologist  Anesthesia Plan Comments:         Anesthesia Quick Evaluation

## 2018-12-16 NOTE — Discharge Instructions (Signed)

## 2018-12-16 NOTE — Anesthesia Postprocedure Evaluation (Signed)
Anesthesia Post Note  Patient: Hayley Nolan  Procedure(s) Performed: BREAST LUMPECTOMY WITH NEEDLE LOCALIZATION AND AXILLARY SENTINEL LYMPH NODE BX (Right Breast)  Patient location during evaluation: PACU Anesthesia Type: General Level of consciousness: awake and alert Pain management: pain level controlled Vital Signs Assessment: post-procedure vital signs reviewed and stable Respiratory status: spontaneous breathing, nonlabored ventilation and respiratory function stable Cardiovascular status: blood pressure returned to baseline and stable Postop Assessment: no signs of nausea or vomiting Anesthetic complications: no     Last Vitals:  Vitals:   12/16/18 0951 12/16/18 1303  BP: (!) 160/74 135/69  Pulse: 81 73  Resp: 18 (!) 9  Temp: 36.9 C (!) 36.1 C  SpO2: 99% 100%    Last Pain:  Vitals:   12/16/18 1303  PainSc: Asleep                 Keean Wilmeth

## 2018-12-16 NOTE — Anesthesia Procedure Notes (Signed)
Procedure Name: LMA Insertion Performed by: Daxten Kovalenko, CRNA Pre-anesthesia Checklist: Patient identified, Patient being monitored, Timeout performed, Emergency Drugs available and Suction available Patient Re-evaluated:Patient Re-evaluated prior to induction Oxygen Delivery Method: Circle system utilized Preoxygenation: Pre-oxygenation with 100% oxygen Induction Type: IV induction Ventilation: Mask ventilation without difficulty LMA: LMA inserted LMA Size: 3.0 Tube type: Oral Number of attempts: 1 Placement Confirmation: positive ETCO2 and breath sounds checked- equal and bilateral Tube secured with: Tape Dental Injury: Teeth and Oropharynx as per pre-operative assessment        

## 2018-12-16 NOTE — H&P (Signed)
ESPYN DYES [id number] 04/19/1927     HPI:  Recently diagnosed breast cancer with foci of invasive cancer and high grade DCIS.  For wide excision. Tolerated needle localization and SLN injection.      Medications Prior to Admission  Medication Sig Dispense Refill Last Dose  . ADVAIR DISKUS 250-50 MCG/DOSE AEPB Inhale 1 puff into the lungs every 12 (twelve) hours.   12/16/2018 at Unknown time  . Biotin (BIOTIN 5000) 5 MG CAPS Take 5 mg by mouth daily.   12/15/2018 at Unknown time  . Cholecalciferol 25 MCG (1000 UT) tablet Take 1,000 Units by mouth daily.   12/15/2018 at Unknown time  . latanoprost (XALATAN) 0.005 % ophthalmic solution Apply 1 drop to eye at bedtime.     . lidocaine-prilocaine (EMLA) cream Apply 1 application topically as needed. 5 g 0   . Multiple Vitamins-Minerals (MULTIVITAL PLATINUM SILVER PO) Take 1 tablet by mouth daily.   12/15/2018 at Unknown time  . Multiple Vitamins-Minerals (PRESERVISION AREDS) CAPS Take 1 capsule by mouth 2 (two) times daily.    12/15/2018 at Unknown time  . potassium chloride (K-DUR,KLOR-CON) 10 MEQ tablet Take 10 mEq by mouth 2 (two) times daily.   12/15/2018 at Unknown time  . PROAIR HFA 108 (90 Base) MCG/ACT inhaler Inhale 2 puffs into the lungs every 4 (four) hours as needed for wheezing.   12/16/2018 at Unknown time  . spironolactone (ALDACTONE) 25 MG tablet Take 25 mg by mouth daily.   12/15/2018 at Unknown time  . guaiFENesin-dextromethorphan (ROBITUSSIN DM) 100-10 MG/5ML syrup Take 5 mLs by mouth every 4 (four) hours as needed for cough. 118 mL 0 Not Taking at Unknown time   Allergies  Allergen Reactions  . Cephalexin Other (See Comments)    Possible headache    Past Medical History:  Diagnosis Date  . Asthma   . Cancer [facility]) 1999   Left breast pre cancerous- mastectomy with reconstruction  . Hypertension    Past Surgical History:  Procedure Laterality Date  . AUGMENTATION MAMMAPLASTY Left 1999  . BREAST BIOPSY Right  11/30/2018   affirm stereo bx of calcs, x marker, path pending  . BREAST BIOPSY Right 11/30/2018   affirm bx of calcs, ribbon marker,path pending  . KIDNEY CYST REMOVAL    . MASTECTOMY Left 1999   Implant  . REDUCTION MAMMAPLASTY Right 1997   Social History   Socioeconomic History  . Marital status: Widowed    Spouse name: Not on file  . Number of children: Not on file  . Years of education: Not on file  . Highest education level: Not on file  Occupational History  . Not on file  Social Needs  . Financial resource strain: Not on file  . Food insecurity    Worry: Not on file    Inability: Not on file  . Transportation needs    Medical: Not on file    Non-medical: Not on file  Tobacco Use  . Smoking status: Never Smoker  . Smokeless tobacco: Never Used  Substance and Sexual Activity  . Alcohol use: Never    Frequency: Never  . Drug use: Never  . Sexual activity: Not on file  Lifestyle  . Physical activity    Days per week: Not on file    Minutes per session: Not on file  . Stress: Not on file  Relationships  . Social Herbalist on phone: Not on file    Gets together: Not on  file    Attends religious service: Not on file    Active member of club or organization: Not on file    Attends meetings of clubs or organizations: Not on file    Relationship status: Not on file  . Intimate partner violence    Fear of current or ex partner: Not on file    Emotionally abused: Not on file    Physically abused: Not on file    Forced sexual activity: Not on file  Other Topics Concern  . Not on file  Social History Narrative  . Not on file   Social History   Social History Narrative  . Not on file     ROS: Negative.     PE: HEENT: Negative. Lungs: Clear. Cardio: RR.    Assessment/Plan:  Proceed with planned right breast wide excision and SLN biopsy.   Forest Gleason Gabryela Kimbrell 12/16/2018

## 2018-12-16 NOTE — Anesthesia Post-op Follow-up Note (Signed)
Anesthesia QCDR form completed.        

## 2018-12-16 NOTE — Op Note (Signed)
Preoperative diagnosis: Invasive mammary carcinoma and DCIS involving the right breast.  Postoperative diagnosis: Same.  Operative procedure: Wide local excision with wire localization and ultrasound guidance, tissue transfer, sentinel node biopsy.  Operating surgeon: Hervey Ard, MD.  Anesthesia: General by LMA, Marcaine 0.5% with 1 to 200,000 units of epinephrine, 20 cc.  Estimated blood loss less than 5 cc.  Clinical note: This 85 year old woman was noted to have an abnormality on her mammogram and subsequent stereotactic biopsy x2 showed an area of invasive mammary carcinoma and close approximated to this was a area of high-grade DCIS.  There are extensive calcifications throughout this area of the breast at the 9-10 o'clock position.  The patient desired breast conservation.  She underwent wire localization prior to the procedure.  Operative note: The patient tolerated general anesthesia well.  The breast was prepped with alcohol and 5 cc of 0.5% methylene blue was instilled in the subareolar plexus.  The breast was then taped medially and ChloraPrep applied.  Ultrasound was used to identify the course of the 2 previously placed localizing wires.  These wires were noted to and near the 9 o'clock position.  There appeared to be an area of hematoma around the prior biopsy site.  This was marked out with ultrasound to allow excision of the entire area of concern.  Local anesthesia was infiltrated.  The skin was incised in a radial fashion with the edge of the areola for about 8 cm.  The skin was incised sharply and the remaining dissection completed with electrocautery.  The adipose tissue was elevated off the breast parenchyma.  A 4 x 5 x 9 cm block of tissue to remove the area of the localizing wires and the previously noted calcifications was undertaken.  The specimen was orientated and specimen radiograph confirmed the tips of both wires as well as both localizing clips.  While the breast  tissue was being processed attention was turned to the axilla.  The node seeker device was used and the axillary envelope was opened to the wide excision site.  2, hot, blue lymph nodes were identified.  These were sent in formalin for routine histology.  Scanning through the axilla showed no areas of increased uptake remaining after these 2 nodes were removed.  The breast and pectoralis fascia was elevated off the underlying pectoralis muscle circumferentially for about 6 cm.  The fascia was then approximated with interrupted 2-0 Vicryl figure-of-eight sutures.  The superficial edge of the breast parenchyma was then approximated in similar fashion.  The adipose layer was approximated with interrupted 2-0 Vicryl sutures and the skin closed with a running 4-0 Vicryl subcuticular suture.  Benzoin Steri-Strips followed by Telfa and a compressive wrap was applied.  Sponge tape and instrument count was correct.  Patient tolerated the procedure well and was taken to recovery room in stable condition.

## 2018-12-16 NOTE — Transfer of Care (Signed)
Immediate Anesthesia Transfer of Care Note  Patient: DEIRDRE RIGGS  Procedure(s) Performed: BREAST LUMPECTOMY WITH NEEDLE LOCALIZATION AND AXILLARY SENTINEL LYMPH NODE BX (Right Breast)  Patient Location: PACU  Anesthesia Type:General  Level of Consciousness: drowsy  Airway & Oxygen Therapy: Patient Spontanous Breathing and Patient connected to face mask oxygen  Post-op Assessment: Report given to RN and Post -op Vital signs reviewed and stable  Post vital signs: Reviewed and stable  Last Vitals:  Vitals Value Taken Time  BP 135/69 12/16/18 1303  Temp    Pulse 72 12/16/18 1304  Resp 9 12/16/18 1304  SpO2 100 % 12/16/18 1304  Vitals shown include unvalidated device data.  Last Pain:  Vitals:   12/16/18 0951  PainSc: 0-No pain         Complications: No apparent anesthesia complications

## 2018-12-16 NOTE — Progress Notes (Signed)
Patient missed her appointment with Dr. Grayland Ormond yesterday.  Called and left her a message to return my call.  In review of the chart I see she is having a lumpectomy today.  Will call her next week.

## 2018-12-17 ENCOUNTER — Encounter: Payer: Self-pay | Admitting: General Surgery

## 2018-12-19 ENCOUNTER — Encounter: Payer: Self-pay | Admitting: *Deleted

## 2018-12-19 NOTE — Progress Notes (Signed)
Called patient.  She is doing well post surgery on Friday.  She missed her medical oncology appointment.  States she will talk to Dr. Bary Castilla and call me back on Thursday after her appointment.  States she is ready to know her plan.

## 2018-12-22 ENCOUNTER — Other Ambulatory Visit: Payer: Self-pay | Admitting: General Surgery

## 2018-12-22 DIAGNOSIS — C50411 Malignant neoplasm of upper-outer quadrant of right female breast: Secondary | ICD-10-CM

## 2018-12-22 NOTE — Progress Notes (Unsigned)
mdt  

## 2018-12-25 NOTE — Progress Notes (Signed)
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  INTERVAL HISTORY: Patient is an 86 year old female who recently underwent lumpectomy for the above-stated breast cancer.  Patient was noted to have close margins on her DCIS.  She currently feels well and is asymptomatic.  She has no neurologic complaints.  She denies any recent fevers or illnesses.  She has a good appetite and denies weight loss.  She has no chest pain, shortness of breath, cough, or hemoptysis.  She denies any nausea, vomiting, constipation, or diarrhea.  She has no urinary complaints.  Patient feels at her baseline offers no specific complaints today.  REVIEW OF SYSTEMS:   Review of Systems  Constitutional: Negative.  Negative for fever, malaise/fatigue and weight loss.  Respiratory: Negative.  Negative for cough, hemoptysis and shortness of breath.   Cardiovascular: Negative.  Negative for chest pain and leg swelling.  Gastrointestinal: Negative.  Negative for abdominal pain.  Genitourinary: Negative.  Negative for dysuria.  Musculoskeletal: Negative.  Negative for back pain.  Skin: Negative.  Negative for rash.  Neurological: Negative.  Negative for dizziness, focal weakness, weakness and headaches.  Psychiatric/Behavioral: Negative.  The patient is not nervous/anxious.     As per HPI. Otherwise, a complete review of systems is negative.  PAST MEDICAL HISTORY: Past Medical History:  Diagnosis Date  . Asthma   . Cancer  [facility]) 1999   Left breast pre cancerous- mastectomy with reconstruction  . Hypertension     PAST SURGICAL HISTORY: Past Surgical History:  Procedure Laterality Date  . AUGMENTATION MAMMAPLASTY Left 1999  . BREAST BIOPSY Right 11/30/2018   affirm stereo bx of calcs, x marker, path pending  . BREAST BIOPSY Right 11/30/2018   affirm bx of calcs, ribbon marker,path pending  . BREAST LUMPECTOMY WITH NEEDLE LOCALIZATION AND AXILLARY SENTINEL LYMPH NODE BX Right 12/16/2018   Procedure: BREAST LUMPECTOMY WITH NEEDLE LOCALIZATION AND AXILLARY SENTINEL LYMPH NODE BX;  Surgeon: Robert Bellow, MD;  Location: [facility] ORS;  Service: General;  Laterality: Right;  . KIDNEY CYST REMOVAL    . MASTECTOMY Left 1999   Implant  . REDUCTION MAMMAPLASTY Right 1997    FAMILY HISTORY: Family History  Problem Relation Age of Onset  . Breast cancer Neg Hx     ADVANCED DIRECTIVES (Y/N):  N  HEALTH MAINTENANCE: Social History   Tobacco Use  . Smoking status: Never Smoker  . Smokeless tobacco: Never Used  Substance Use Topics  . Alcohol use: Never    Frequency: Never  . Drug use: Never     Colonoscopy:  PAP:  Bone density:  Lipid panel:  Allergies  Allergen Reactions  . Cephalexin Other (See Comments)    Possible headache     Current Outpatient Medications  Medication Sig Dispense Refill  . ADVAIR DISKUS 250-50 MCG/DOSE AEPB Inhale 1 puff into the lungs every 12 (twelve) hours.    . Biotin (BIOTIN 5000) 5 MG CAPS Take 5 mg by mouth daily.    . Cholecalciferol 25 MCG (1000 UT) tablet Take 1,000 Units  by mouth daily.    Marland Kitchen guaiFENesin-dextromethorphan (ROBITUSSIN DM) 100-10 MG/5ML syrup Take 5 mLs by mouth every 4 (four) hours as needed for cough. 118 mL 0  . latanoprost (XALATAN) 0.005 % ophthalmic solution Apply 1 drop to eye at bedtime.    . lidocaine-prilocaine (EMLA) cream Apply 1 application topically as needed. 5 g 0  . Multiple Vitamins-Minerals (MULTIVITAL PLATINUM SILVER PO) Take  1 tablet by mouth daily.    . Multiple Vitamins-Minerals (PRESERVISION AREDS) CAPS Take 1 capsule by mouth 2 (two) times daily.     . potassium chloride (K-DUR,KLOR-CON) 10 MEQ tablet Take 10 mEq by mouth 2 (two) times daily.    Marland Kitchen PROAIR HFA 108 (90 Base) MCG/ACT inhaler Inhale 2 puffs into the lungs every 4 (four) hours as needed for wheezing.    Marland Kitchen spironolactone (ALDACTONE) 25 MG tablet Take 25 mg by mouth daily.    . traMADol (ULTRAM) 50 MG tablet Take 1 tablet (50 mg total) by mouth every 6 (six) hours as needed. 12 tablet 0   No current facility-administered medications for this visit.     OBJECTIVE: Vitals:   12/29/18 1458  BP: (!) 146/84  Pulse: 82  Temp: 98.4 F (36.9 C)     Body mass index is 21.89 kg/m.    ECOG FS:0 - Asymptomatic  General: Well-developed, well-nourished, no acute distress. Eyes: Pink conjunctiva, anicteric sclera. HEENT: Normocephalic, moist mucous membranes, clear oropharnyx. Breast: Patient requested exam be deferred today. Lungs: Clear to auscultation bilaterally. Heart: Regular rate and rhythm. No rubs, murmurs, or gallops. Abdomen: Soft, nontender, nondistended. No organomegaly noted, normoactive bowel sounds. Musculoskeletal: No edema, cyanosis, or clubbing. Neuro: Alert, answering all questions appropriately. Cranial nerves grossly intact. Skin: No rashes or petechiae noted. Psych: Normal affect.  LAB RESULTS:  Lab Results  Component Value Date   NA 141 12/13/2018   K 3.8 12/13/2018   CL 102 12/13/2018   CO2 28 12/13/2018   GLUCOSE 93 12/13/2018   BUN 19 12/13/2018   CREATININE 0.80 12/13/2018   CALCIUM 9.6 12/13/2018   GFRNONAA >60 12/13/2018   GFRAA >60 12/13/2018    Lab Results  Component Value Date   WBC 7.2 05/10/2018   HGB 12.9 05/10/2018   HCT 39.5 05/10/2018   MCV 88.8 05/10/2018   PLT 216 05/10/2018     STUDIES: Nm Sentinel Node Injection  Result Date: 12/16/2018 CLINICAL DATA:  Right breast cancer. EXAM:  NUCLEAR MEDICINE BREAST LYMPHOSCINTIGRAPHY RIGHT BREAST TECHNIQUE: Intradermal injection of radiopharmaceutical was performed at the 12 o'clock, 3 o'clock, 6 o'clock, and 9 o'clock positions around the right nipple. The patient was then sent to the operating room where the sentinel node(s) were identified and removed by the surgeon. RADIOPHARMACEUTICALS:  Total of 0.8 mCi Millipore-filtered Technetium-12msulfur colloid, injected in four aliquots each. IMPRESSION: Uncomplicated intradermal injection of a total of 0.8 mCi Technetium-973mulfur colloid for purposes of sentinel node identification. Electronically Signed   By: [place] On: 12/16/2018 10:02   Mm Breast Surgical Specimen  Result Date: 12/16/2018 CLINICAL DATA:  Patient with newly diagnosed right breast cancer. Status post right breast lumpectomy. EXAM: SPECIMEN RADIOGRAPH OF THE RIGHT BREAST COMPARISON:  Previous exam(s). FINDINGS: Status post excision of the right breast. The two wire tips and the two (ribbon and X shaped) biopsy marker clips are present and are marked for pathology. IMPRESSION: Specimen radiograph of the right breast. Electronically Signed   By: NaAudie Pinto.D.   On: 12/16/2018  12:32   Mm Rt Plc Breast Loc Dev   1st Lesion  Inc Mammo Guide  Result Date: 12/16/2018 CLINICAL DATA:  Patient presents prior to same-day surgery for bracket localization of calcifications in the right breast. EXAM: NEEDLE LOCALIZATION OF THE RIGHT BREAST WITH MAMMO GUIDANCE x 2 COMPARISON:  Previous exams. FINDINGS: Patient presents for needle localization prior to lumpectomy for right breast calcifications. I met with the patient and we discussed the procedure of needle localization including benefits and alternatives. We discussed the high likelihood of a successful procedure. We discussed the risks of the procedure, including infection, bleeding, tissue injury, and further surgery. Informed, written consent was given. The usual  time-out protocol was performed immediately prior to the procedure. Using mammographic guidance, sterile technique, 1% lidocaine and a 9 cm modified Kopans needle, the anterior ribbon clip was localized using superior approach. Using mammographic guidance, sterile technique, 1% lidocaine and a 9 cm modified Kopans needle, the posterior X shaped clip localized using superior approach. The images were marked for Dr. Bary Castilla. IMPRESSION: Needle bracket localization of the right breast. No apparent complications. Electronically Signed   By: Audie Pinto M.D.   On: 12/16/2018 09:04   Mm Rt Plc Breast Loc Dev   Ea Add Lesion  Inc Mammo Guide  Result Date: 12/16/2018 CLINICAL DATA:  Patient presents prior to same-day surgery for bracket localization of calcifications in the right breast. EXAM: NEEDLE LOCALIZATION OF THE RIGHT BREAST WITH MAMMO GUIDANCE x 2 COMPARISON:  Previous exams. FINDINGS: Patient presents for needle localization prior to lumpectomy for right breast calcifications. I met with the patient and we discussed the procedure of needle localization including benefits and alternatives. We discussed the high likelihood of a successful procedure. We discussed the risks of the procedure, including infection, bleeding, tissue injury, and further surgery. Informed, written consent was given. The usual time-out protocol was performed immediately prior to the procedure. Using mammographic guidance, sterile technique, 1% lidocaine and a 9 cm modified Kopans needle, the anterior ribbon clip was localized using superior approach. Using mammographic guidance, sterile technique, 1% lidocaine and a 9 cm modified Kopans needle, the posterior X shaped clip localized using superior approach. The images were marked for Dr. Bary Castilla. IMPRESSION: Needle bracket localization of the right breast. No apparent complications. Electronically Signed   By: Audie Pinto M.D.   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  PLAN:    1. 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  Although guidelines recommend reexcision giving her close margins, the benefit of this is unclear given patient's advanced age.  Whether she undergoes reexcision or not, she will require adjuvant XRT and has consultation with radiation oncology early next week.  Will send Oncotype DX to assess whether this is a high risk lesion, although again given her advanced age even if high risk may not pursue chemotherapy.  Finally, given the ER/PR status of her tumor patient will benefit from an aromatase inhibitor for a minimum of 5 years, although may extend treatment given the extensive disease and close margins on surgery.  Patient has been instructed to keep her follow-up appointments with radiation oncology and surgery in  the next 1 to 2 weeks to further discuss her treatment options.  She will then return to clinic in 3 weeks for further evaluation and treatment planning if necessary.  I spent a total of 60 minutes face-to-face with the patient of which greater than 50% of the visit was spent in counseling and coordination of care as detailed above.   Patient expressed understanding and was in agreement with this plan. She also understands that She can call clinic at any time with any questions, concerns, or complaints.   Cancer Staging Primary cancer of upper outer quadrant of right female breast [facility]) Staging form: Breast, AJCC 8th Edition - Clinical stage from 12/12/2018: cT2, cN0, cM0, G3, ER+, PR: Unknown, HER2: Unknown - Signed by Lloyd Huger, MD on 12/12/2018   Lloyd Huger,  MD   12/30/2018 2:53 PM

## 2018-12-28 ENCOUNTER — Other Ambulatory Visit: Payer: Self-pay

## 2018-12-28 NOTE — Progress Notes (Signed)
Patient pre screened for office appointment, no questions or concerns today. 

## 2018-12-29 ENCOUNTER — Encounter: Payer: Self-pay | Admitting: *Deleted

## 2018-12-29 ENCOUNTER — Other Ambulatory Visit: Payer: Self-pay

## 2018-12-29 ENCOUNTER — Inpatient Hospital Stay: Payer: PPO | Attending: Oncology | Admitting: Oncology

## 2018-12-29 VITALS — BP 146/84 | HR 82 | Temp 98.4°F | Wt 127.5 lb

## 2018-12-29 DIAGNOSIS — D0511 Intraductal carcinoma in situ of right breast: Secondary | ICD-10-CM

## 2018-12-29 DIAGNOSIS — Z170 Estrogen receptor positive status [ER+]: Secondary | ICD-10-CM | POA: Diagnosis not present

## 2018-12-29 DIAGNOSIS — C50411 Malignant neoplasm of upper-outer quadrant of right female breast: Secondary | ICD-10-CM | POA: Diagnosis not present

## 2018-12-30 ENCOUNTER — Encounter: Payer: Self-pay | Admitting: *Deleted

## 2018-12-30 NOTE — Progress Notes (Signed)
Met patient and her daughter during her initial medical oncology consult with Dr. Grayland Ormond.  Patient is considering surgical re-excision for clear margins, radiation therapy and antihormonal therapy.  She is also considering Oncotype Dx testing.  She is to call with any questions or needs.

## 2018-12-30 NOTE — Progress Notes (Signed)
Patient called to voice concerns over not hearing from the surgeons office.  States she did call them and he is out of the office today.  Encouraged her if she has not heard by Tuesday to let me know.  She is agreeable.

## 2019-01-02 ENCOUNTER — Ambulatory Visit: Payer: PPO | Admitting: Radiation Oncology

## 2019-01-02 ENCOUNTER — Encounter: Payer: Self-pay | Admitting: General Surgery

## 2019-01-02 DIAGNOSIS — H40053 Ocular hypertension, bilateral: Secondary | ICD-10-CM | POA: Diagnosis not present

## 2019-01-02 LAB — SURGICAL PATHOLOGY

## 2019-01-04 ENCOUNTER — Other Ambulatory Visit: Payer: Self-pay

## 2019-01-05 ENCOUNTER — Other Ambulatory Visit: Payer: Self-pay

## 2019-01-05 ENCOUNTER — Ambulatory Visit
Admission: RE | Admit: 2019-01-05 | Discharge: 2019-01-05 | Disposition: A | Discharge status: Home / Self Care | Payer: PPO | Source: Ambulatory Visit | Attending: Radiation Oncology | Admitting: Radiation Oncology

## 2019-01-05 ENCOUNTER — Encounter: Payer: Self-pay | Admitting: Radiation Oncology

## 2019-01-05 VITALS — BP 145/81 | HR 77 | Temp 97.9°F | Resp 16 | Wt 128.4 lb

## 2019-01-05 DIAGNOSIS — Z170 Estrogen receptor positive status [ER+]: Secondary | ICD-10-CM | POA: Insufficient documentation

## 2019-01-05 DIAGNOSIS — I10 Essential (primary) hypertension: Secondary | ICD-10-CM | POA: Diagnosis not present

## 2019-01-05 DIAGNOSIS — Z79899 Other long term (current) drug therapy: Secondary | ICD-10-CM | POA: Diagnosis not present

## 2019-01-05 DIAGNOSIS — C50411 Malignant neoplasm of upper-outer quadrant of right female breast: Secondary | ICD-10-CM | POA: Diagnosis not present

## 2019-01-05 NOTE — Consult Note (Signed)
NEW PATIENT EVALUATION  Name: Hayley Nolan  MRN: [id number]  Date:   01/05/2019     DOB: [birth year]   This 85 y.o. female patient presents to the clinic for initial evaluation of stage Ia ER/PR positive HER-2 negative invasive mammary carcinoma of the right breast status post wide local excision with close or involved DCIS margin  REFERRING PHYSICIAN: Robert Bellow, MD  CHIEF COMPLAINT:  Chief Complaint  Patient presents with  . Breast Cancer    Initial consultation    DIAGNOSIS: The encounter diagnosis was Primary cancer of upper outer quadrant of right female breast ([place]).   PREVIOUS INVESTIGATIONS:  Mammogram and ultrasound reviewed Pathology port reviewed Clinical notes reviewed   HPI: Patient is a 85 year old female status post left modified radical mastectomy with implant back 30 years prior.  She presented with abnormal calcifications of the right breast in the lower outer quadrant showing indeterminate loosely grouped calcifications.  She underwent stereotactic tomosynthesis core biopsy of those calcifications which were positive for invasive mammary carcinoma.  She then underwent a right wide local excision showing overall grade 3 invasive mammary carcinoma with margins clear but close at 1 mm.  There was ductal carcinoma also involved and inferior margin was positive at less than 1 mm.  2 sentinel lymph nodes were reviewed showing no evidence of metastatic disease.  Tumor sizes were 1.5 cm and 1.3 cm in greatest dimension.  Patient has done well postoperatively.  She is seen medical oncology who is ordered Oncotype DX testing as well as Dr. Tollie Pizza who is in favor of doing no more surgery but going ahead with radiation therapy with a boost to her breast.  Seen today for opinion she is doing well she specifically denies right breast tenderness cough or bone pain.  PLANNED TREATMENT REGIMEN: Right hypofractionated breast radiation with boost  PAST MEDICAL HISTORY:  has  a past medical history of Asthma, Cancer ([place]) (1999), and Hypertension.    PAST SURGICAL HISTORY:  Past Surgical History:  Procedure Laterality Date  . AUGMENTATION MAMMAPLASTY Left 1999  . BREAST BIOPSY Right 11/30/2018   affirm stereo bx of calcs, x marker, path pending  . BREAST BIOPSY Right 11/30/2018   affirm bx of calcs, ribbon marker,path pending  . BREAST LUMPECTOMY WITH NEEDLE LOCALIZATION AND AXILLARY SENTINEL LYMPH NODE BX Right 12/16/2018   Procedure: BREAST LUMPECTOMY WITH NEEDLE LOCALIZATION AND AXILLARY SENTINEL LYMPH NODE BX;  Surgeon: Robert Bellow, MD;  Location: [facility] ORS;  Service: General;  Laterality: Right;  . KIDNEY CYST REMOVAL    . MASTECTOMY Left 1999   Implant  . REDUCTION MAMMAPLASTY Right 1997    FAMILY HISTORY: family history is not on file.  SOCIAL HISTORY:  reports that she has never smoked. She has never used smokeless tobacco. She reports that she does not drink alcohol or use drugs.  ALLERGIES: Cephalexin  MEDICATIONS:  Current Outpatient Medications  Medication Sig Dispense Refill  . ADVAIR DISKUS 250-50 MCG/DOSE AEPB Inhale 1 puff into the lungs every 12 (twelve) hours.    . Biotin (BIOTIN 5000) 5 MG CAPS Take 5 mg by mouth daily.    . Cholecalciferol 25 MCG (1000 UT) tablet Take 1,000 Units by mouth daily.    Marland Kitchen guaiFENesin-dextromethorphan (ROBITUSSIN DM) 100-10 MG/5ML syrup Take 5 mLs by mouth every 4 (four) hours as needed for cough. 118 mL 0  . Multiple Vitamins-Minerals (MULTIVITAL PLATINUM SILVER PO) Take 1 tablet by mouth daily.    . Multiple Vitamins-Minerals (  PRESERVISION AREDS) CAPS Take 1 capsule by mouth 2 (two) times daily.     . potassium chloride (K-DUR,KLOR-CON) 10 MEQ tablet Take 10 mEq by mouth 2 (two) times daily.    Marland Kitchen PROAIR HFA 108 (90 Base) MCG/ACT inhaler Inhale 2 puffs into the lungs every 4 (four) hours as needed for wheezing.    Marland Kitchen spironolactone (ALDACTONE) 25 MG tablet Take 25 mg by mouth daily.    . traMADol  (ULTRAM) 50 MG tablet Take 1 tablet (50 mg total) by mouth every 6 (six) hours as needed. 12 tablet 0  . latanoprost (XALATAN) 0.005 % ophthalmic solution Apply 1 drop to eye at bedtime.     No current facility-administered medications for this encounter.     ECOG PERFORMANCE STATUS:  0 - Asymptomatic  REVIEW OF SYSTEMS: Patient denies any weight loss, fatigue, weakness, fever, chills or night sweats. Patient denies any loss of vision, blurred vision. Patient denies any ringing  of the ears or hearing loss. No irregular heartbeat. Patient denies heart murmur or history of fainting. Patient denies any chest pain or pain radiating to her upper extremities. Patient denies any shortness of breath, difficulty breathing at night, cough or hemoptysis. Patient denies any swelling in the lower legs. Patient denies any nausea vomiting, vomiting of blood, or coffee ground material in the vomitus. Patient denies any stomach pain. Patient states has had normal bowel movements no significant constipation or diarrhea. Patient denies any dysuria, hematuria or significant nocturia. Patient denies any problems walking, swelling in the joints or loss of balance. Patient denies any skin changes, loss of hair or loss of weight. Patient denies any excessive worrying or anxiety or significant depression. Patient denies any problems with insomnia. Patient denies excessive thirst, polyuria, polydipsia. Patient denies any swollen glands, patient denies easy bruising or easy bleeding. Patient denies any recent infections, allergies or URI. Patient "s visual fields have not changed significantly in recent time.   PHYSICAL EXAM: BP (!) 145/81 (BP Location: Left Arm, Patient Position: Sitting)   Pulse 77   Temp 97.9 F (36.6 C) (Tympanic)   Resp 16   Wt 128 lb 6.4 oz (58.2 kg)   BMI 22.04 kg/m  Patient status post left modified radical mastectomy with breast implant.  Implant is out of place and is migrated superiorly.  Right  breast is wide local excision in a linear fashion in the right outer quadrant at the 9 o'clock position no dominant mass or nodularity is noted.  No axillary or supraclavicular adenopathy is appreciated.  Well-developed well-nourished patient in NAD. HEENT reveals PERLA, EOMI, discs not visualized.  Oral cavity is clear. No oral mucosal lesions are identified. Neck is clear without evidence of cervical or supraclavicular adenopathy. Lungs are clear to A&P. Cardiac examination is essentially unremarkable with regular rate and rhythm without murmur rub or thrill. Abdomen is benign with no organomegaly or masses noted. Motor sensory and DTR levels are equal and symmetric in the upper and lower extremities. Cranial nerves II through XII are grossly intact. Proprioception is intact. No peripheral adenopathy or edema is identified. No motor or sensory levels are noted. Crude visual fields are within normal range.  LABORATORY DATA: Pathology report reviewed    RADIOLOGY RESULTS: Mammogram and ultrasound reviewed   IMPRESSION: Stage I invasive mammary carcinoma of the right breast status post wide local excision and sentinel node biopsy ER/PR positive with margins clear but close for invasive component and in less than 1 mm for DCIS in  85 year old female  PLAN: At this time I like to go ahead and have recommended whole breast radiation.  I would plan on delivering 40 to 56 cGy over 3 weeks in a hypofractionated regiment.  Based on the close margin would also boost another 1600 cGy in 8 fractions using electron beam.  Risks and benefits of treatment including skin reaction fatigue alteration of blood counts possible occlusion of superficial lung all were described in detail to both the patient and her daughter.  They both seem to comprehend my treatment plan well.  I have scheduled her for CT simulation next week.  Hopefully her Oncotype DX will be finalized by that time.  Patient also will benefit from  antiestrogen therapy after completion of radiation.  I would like to take this opportunity to thank you for allowing me to participate in the care of your patient.Noreene Filbert, MD

## 2019-01-09 DIAGNOSIS — H6123 Impacted cerumen, bilateral: Secondary | ICD-10-CM | POA: Diagnosis not present

## 2019-01-09 DIAGNOSIS — H902 Conductive hearing loss, unspecified: Secondary | ICD-10-CM | POA: Diagnosis not present

## 2019-01-10 ENCOUNTER — Encounter: Payer: Self-pay | Admitting: Oncology

## 2019-01-10 ENCOUNTER — Other Ambulatory Visit: Payer: Self-pay

## 2019-01-10 DIAGNOSIS — C50912 Malignant neoplasm of unspecified site of left female breast: Secondary | ICD-10-CM | POA: Diagnosis not present

## 2019-01-10 DIAGNOSIS — Z170 Estrogen receptor positive status [ER+]: Secondary | ICD-10-CM | POA: Diagnosis not present

## 2019-01-11 ENCOUNTER — Ambulatory Visit
Admission: RE | Admit: 2019-01-11 | Discharge: 2019-01-11 | Disposition: A | Discharge status: Home / Self Care | Payer: PPO | Source: Ambulatory Visit | Attending: Radiation Oncology | Admitting: Radiation Oncology

## 2019-01-11 ENCOUNTER — Other Ambulatory Visit: Payer: Self-pay

## 2019-01-11 ENCOUNTER — Inpatient Hospital Stay: Payer: PPO | Attending: Oncology | Admitting: Oncology

## 2019-01-11 ENCOUNTER — Encounter: Payer: Self-pay | Admitting: Oncology

## 2019-01-11 VITALS — BP 144/79 | HR 78 | Temp 96.4°F | Resp 16 | Wt 128.5 lb

## 2019-01-11 DIAGNOSIS — Z510 Encounter for antineoplastic radiation therapy: Secondary | ICD-10-CM | POA: Diagnosis not present

## 2019-01-11 DIAGNOSIS — Z9012 Acquired absence of left breast and nipple: Secondary | ICD-10-CM | POA: Diagnosis not present

## 2019-01-11 DIAGNOSIS — C50411 Malignant neoplasm of upper-outer quadrant of right female breast: Secondary | ICD-10-CM | POA: Insufficient documentation

## 2019-01-11 DIAGNOSIS — Z170 Estrogen receptor positive status [ER+]: Secondary | ICD-10-CM | POA: Insufficient documentation

## 2019-01-11 DIAGNOSIS — N189 Chronic kidney disease, unspecified: Secondary | ICD-10-CM | POA: Insufficient documentation

## 2019-01-11 DIAGNOSIS — C773 Secondary and unspecified malignant neoplasm of axilla and upper limb lymph nodes: Secondary | ICD-10-CM | POA: Diagnosis not present

## 2019-01-11 DIAGNOSIS — Z853 Personal history of malignant neoplasm of breast: Secondary | ICD-10-CM | POA: Insufficient documentation

## 2019-01-11 DIAGNOSIS — D0511 Intraductal carcinoma in situ of right breast: Secondary | ICD-10-CM | POA: Insufficient documentation

## 2019-01-11 NOTE — Progress Notes (Signed)
Patient and her Daughter are here today for follow up.

## 2019-01-11 NOTE — Progress Notes (Signed)
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  INTERVAL HISTORY: Patient returns to clinic today for further evaluation and treatment planning.  She was recently found to be FISH positive for HER-2 and is here to discuss possible chemotherapy.  She continues to feel well and remains asymptomatic.  She has no neurologic complaints.  She denies any recent fevers or illnesses.  She has a good appetite and denies weight loss.  She has no chest pain, shortness of breath, cough, or hemoptysis.  She denies any nausea, vomiting, constipation, or diarrhea.  She has no urinary complaints.  Patient offers no specific complaints today.  REVIEW OF SYSTEMS:   Review of Systems  Constitutional: Negative.  Negative for fever, malaise/fatigue and weight loss.  Respiratory: Negative.  Negative for cough, hemoptysis and shortness of breath.   Cardiovascular: Negative.  Negative for chest pain and leg swelling.  Gastrointestinal: Negative.  Negative for abdominal pain.  Genitourinary: Negative.  Negative for dysuria.  Musculoskeletal: Negative.  Negative for back pain.  Skin: Negative.  Negative for rash.  Neurological: Negative.  Negative for dizziness, focal weakness, weakness and headaches.  Psychiatric/Behavioral: Negative.  The patient is not nervous/anxious.     As per HPI. Otherwise, a complete review of systems is negative.  PAST MEDICAL HISTORY: Past Medical History:  Diagnosis Date   Asthma    Cancer ([place])  1999   Left breast pre cancerous- mastectomy with reconstruction   Hypertension     PAST SURGICAL HISTORY: Past Surgical History:  Procedure Laterality Date   AUGMENTATION MAMMAPLASTY Left 1999   BREAST BIOPSY Right 11/30/2018   affirm stereo bx of calcs, x marker, path pending   BREAST BIOPSY Right 11/30/2018   affirm bx of calcs, ribbon marker,path pending   BREAST LUMPECTOMY WITH NEEDLE LOCALIZATION AND AXILLARY SENTINEL LYMPH NODE BX Right 12/16/2018   Procedure: BREAST LUMPECTOMY WITH NEEDLE LOCALIZATION AND AXILLARY SENTINEL LYMPH NODE BX;  Surgeon: Robert Bellow, MD;  Location: [facility] ORS;  Service: General;  Laterality: Right;   KIDNEY CYST REMOVAL     MASTECTOMY Left 1999   Implant   REDUCTION MAMMAPLASTY Right 1997    FAMILY HISTORY: Family History  Problem Relation Age of Onset   Breast cancer Neg Hx     ADVANCED DIRECTIVES (Y/N):  N  HEALTH MAINTENANCE: Social History   Tobacco Use   Smoking status: Never Smoker   Smokeless tobacco: Never Used  Substance Use Topics   Alcohol use: Never    Frequency: Never   Drug use: Never     Colonoscopy:  PAP:  Bone density:  Lipid panel:  Allergies  Allergen Reactions   Cephalexin Other (See Comments)    Possible headache     Current Outpatient Medications  Medication Sig Dispense Refill   ADVAIR DISKUS 250-50 MCG/DOSE AEPB Inhale 1 puff into the lungs every 12 (twelve) hours.     Biotin (BIOTIN 5000) 5 MG CAPS Take 5 mg by mouth daily.     Cholecalciferol 25 MCG (1000 UT) tablet Take 1,000 Units by  mouth daily.     Multiple Vitamins-Minerals (MULTIVITAL PLATINUM SILVER PO) Take 1 tablet by mouth daily.     Multiple Vitamins-Minerals (PRESERVISION AREDS) CAPS Take 1 capsule by mouth 2 (two) times daily.      potassium chloride (K-DUR,KLOR-CON) 10 MEQ tablet Take 10 mEq by mouth 2 (two) times daily.     PROAIR HFA 108 (90 Base) MCG/ACT inhaler Inhale 2 puffs into the lungs every 4  (four) hours as needed for wheezing.     spironolactone (ALDACTONE) 25 MG tablet Take 25 mg by mouth daily.     traMADol (ULTRAM) 50 MG tablet Take 1 tablet (50 mg total) by mouth every 6 (six) hours as needed. 12 tablet 0   guaiFENesin-dextromethorphan (ROBITUSSIN DM) 100-10 MG/5ML syrup Take 5 mLs by mouth every 4 (four) hours as needed for cough. (Patient not taking: Reported on 01/11/2019) 118 mL 0   latanoprost (XALATAN) 0.005 % ophthalmic solution Apply 1 drop to eye at bedtime.     No current facility-administered medications for this visit.     OBJECTIVE: Vitals:   01/11/19 0933  BP: (!) 144/79  Pulse: 78  Resp: 16  Temp: (!) 96.4 F (35.8 C)  SpO2: 98%     Body mass index is 22.06 kg/m.    ECOG FS:0 - Asymptomatic  General: Well-developed, well-nourished, no acute distress. Eyes: Pink conjunctiva, anicteric sclera. HEENT: Normocephalic, moist mucous membranes. Breast: Exam deferred today. Lungs: Clear to auscultation bilaterally. Heart: Regular rate and rhythm. No rubs, murmurs, or gallops. Abdomen: Soft, nontender, nondistended. No organomegaly noted, normoactive bowel sounds. Musculoskeletal: No edema, cyanosis, or clubbing. Neuro: Alert, answering all questions appropriately. Cranial nerves grossly intact. Skin: No rashes or petechiae noted. Psych: Normal affect.  LAB RESULTS:  Lab Results  Component Value Date   NA 141 12/13/2018   K 3.8 12/13/2018   CL 102 12/13/2018   CO2 28 12/13/2018   GLUCOSE 93 12/13/2018   BUN 19 12/13/2018   CREATININE 0.80 12/13/2018   CALCIUM 9.6 12/13/2018   GFRNONAA >60 12/13/2018   GFRAA >60 12/13/2018    Lab Results  Component Value Date   WBC 7.2 05/10/2018   HGB 12.9 05/10/2018   HCT 39.5 05/10/2018   MCV 88.8 05/10/2018   PLT 216 05/10/2018     STUDIES: Nm Sentinel Node Injection  Result Date: 12/16/2018 CLINICAL DATA:  Right breast cancer. EXAM: NUCLEAR MEDICINE BREAST LYMPHOSCINTIGRAPHY RIGHT BREAST  TECHNIQUE: Intradermal injection of radiopharmaceutical was performed at the 12 o'clock, 3 o'clock, 6 o'clock, and 9 o'clock positions around the right nipple. The patient was then sent to the operating room where the sentinel node(s) were identified and removed by the surgeon. RADIOPHARMACEUTICALS:  Total of 0.8 mCi Millipore-filtered Technetium-43msulfur colloid, injected in four aliquots each. IMPRESSION: Uncomplicated intradermal injection of a total of 0.8 mCi Technetium-966mulfur colloid for purposes of sentinel node identification. Electronically Signed   By: ThRobbins On: 12/16/2018 10:02   Mm Breast Surgical Specimen  Result Date: 12/16/2018 CLINICAL DATA:  Patient with newly diagnosed right breast cancer. Status post right breast lumpectomy. EXAM: SPECIMEN RADIOGRAPH OF THE RIGHT BREAST COMPARISON:  Previous exam(s). FINDINGS: Status post excision of the right breast. The two wire tips and the two (ribbon and X shaped) biopsy marker clips are present and are marked for pathology. IMPRESSION: Specimen radiograph of the right breast. Electronically Signed   By: NaAudie Pinto.D.   On: 12/16/2018 12:32   Mm Rt Plc Breast  Loc Dev   1st Lesion  Inc Mammo Guide  Result Date: 12/16/2018 CLINICAL DATA:  Patient presents prior to same-day surgery for bracket localization of calcifications in the right breast. EXAM: NEEDLE LOCALIZATION OF THE RIGHT BREAST WITH MAMMO GUIDANCE x 2 COMPARISON:  Previous exams. FINDINGS: Patient presents for needle localization prior to lumpectomy for right breast calcifications. I met with the patient and we discussed the procedure of needle localization including benefits and alternatives. We discussed the high likelihood of a successful procedure. We discussed the risks of the procedure, including infection, bleeding, tissue injury, and further surgery. Informed, written consent was given. The usual time-out protocol was performed immediately prior to the  procedure. Using mammographic guidance, sterile technique, 1% lidocaine and a 9 cm modified Kopans needle, the anterior ribbon clip was localized using superior approach. Using mammographic guidance, sterile technique, 1% lidocaine and a 9 cm modified Kopans needle, the posterior X shaped clip localized using superior approach. The images were marked for Dr. Bary Castilla. IMPRESSION: Needle bracket localization of the right breast. No apparent complications. Electronically Signed   By: Audie Pinto M.D.   On: 12/16/2018 09:04   Mm Rt Plc Breast Loc Dev   Ea Add Lesion  Inc Mammo Guide  Result Date: 12/16/2018 CLINICAL DATA:  Patient presents prior to same-day surgery for bracket localization of calcifications in the right breast. EXAM: NEEDLE LOCALIZATION OF THE RIGHT BREAST WITH MAMMO GUIDANCE x 2 COMPARISON:  Previous exams. FINDINGS: Patient presents for needle localization prior to lumpectomy for right breast calcifications. I met with the patient and we discussed the procedure of needle localization including benefits and alternatives. We discussed the high likelihood of a successful procedure. We discussed the risks of the procedure, including infection, bleeding, tissue injury, and further surgery. Informed, written consent was given. The usual time-out protocol was performed immediately prior to the procedure. Using mammographic guidance, sterile technique, 1% lidocaine and a 9 cm modified Kopans needle, the anterior ribbon clip was localized using superior approach. Using mammographic guidance, sterile technique, 1% lidocaine and a 9 cm modified Kopans needle, the posterior X shaped clip localized using superior approach. The images were marked for Dr. Bary Castilla. IMPRESSION: Needle bracket localization of the right breast. No apparent complications. Electronically Signed   By: Audie Pinto M.D.   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  PLAN:    1. 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  Although guidelines recommend reexcision giving her close margins, the benefit of this is unclear given patient's advanced age and she ultimately chose not to undergo resection.  Plan was to initiate adjuvant XRT but we discussed the possibility of proceeding with weekly Taxol and Herceptin x12 followed by maintenance Herceptin every 3 weeks for a year.  Patient has agreed that she will likely pursue Herceptin, but may wish to do this as a single agent and declining Taxol.  She is going to have her simulation today and then have a video visit on Monday to discuss her final decision.  Patient expressed understanding that doing Herceptin alone will likely not be as effective and her chance of recurrence may increase.  No matter her choice on treatment,  she will benefit from an aromatase inhibitor for a minimum of 5 years, although may extend treatment given the extensive disease and close margins on surgery.    I spent a total of 30 minutes face-to-face with the patient of which greater than 50% of the visit was spent in counseling and coordination of care as detailed above.   Patient expressed understanding and was in agreement with this plan. She also understands that She can call clinic at any time with any questions, concerns, or complaints.   Cancer Staging Primary cancer of upper outer quadrant of right female breast [facility]) Staging form: Breast, AJCC 8th Edition - Clinical stage from 12/12/2018: Stage IB (cT2, cN0, cM0, G3, ER+, PR+, HER2+) - Signed by Lloyd Huger, MD on 01/11/2019   Lloyd Huger, MD   01/11/2019 4:07 PM

## 2019-01-12 ENCOUNTER — Other Ambulatory Visit: Payer: Self-pay | Admitting: *Deleted

## 2019-01-12 DIAGNOSIS — H40053 Ocular hypertension, bilateral: Secondary | ICD-10-CM | POA: Diagnosis not present

## 2019-01-12 DIAGNOSIS — Z170 Estrogen receptor positive status [ER+]: Secondary | ICD-10-CM | POA: Diagnosis not present

## 2019-01-12 DIAGNOSIS — Z510 Encounter for antineoplastic radiation therapy: Secondary | ICD-10-CM | POA: Diagnosis not present

## 2019-01-12 DIAGNOSIS — C50411 Malignant neoplasm of upper-outer quadrant of right female breast: Secondary | ICD-10-CM | POA: Diagnosis not present

## 2019-01-13 NOTE — Progress Notes (Signed)
[facility]  [phone] [phone]  ID: Hayley Nolan OB: 09-21-1927  MR#: [id number]  NMM#:[id number]  Patient Care Team: Kirk Ruths, MD as PCP - General (Internal Medicine) Rico Junker, RN as Registered Nurse Rico Junker, RN as Registered Nurse Rico Junker, RN as Registered Nurse  I connected with Hayley Nolan on 01/16/19 at  2:30 PM EST by video enabled telemedicine visit and verified that I am speaking with the correct person using two identifiers.   I discussed the limitations, risks, security and privacy concerns of performing an evaluation and management service by telemedicine and the availability of in-person appointments. I also discussed with the patient that there may be a patient responsible charge related to this service. The patient expressed understanding and agreed to proceed.   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  INTERVAL HISTORY: Patient agreed to video enabled telemedicine visit to further discuss treatment options. She continues to feel well and remains asymptomatic.  She has no neurologic complaints.  She denies any recent fevers or illnesses.  She has a good appetite and denies weight loss.  She has no chest pain, shortness of breath, cough, or hemoptysis.  She denies any nausea, vomiting, constipation, or diarrhea.  She has no urinary complaints.  Patient feels at her baseline offers no specific complaints today.  REVIEW OF SYSTEMS:   Review of Systems  Constitutional: Negative.  Negative for fever, malaise/fatigue and weight loss.  Respiratory: Negative.  Negative for cough, hemoptysis and  shortness of breath.   Cardiovascular: Negative.  Negative for chest pain and leg swelling.  Gastrointestinal: Negative.  Negative for abdominal pain.  Genitourinary: Negative.  Negative for dysuria.  Musculoskeletal: Negative.  Negative for back pain.  Skin: Negative.  Negative for rash.  Neurological: Negative.  Negative for dizziness, focal weakness, weakness and headaches.  Psychiatric/Behavioral: Negative.  The patient is not nervous/anxious.     As per HPI. Otherwise, a complete review of systems is negative.  PAST MEDICAL HISTORY: Past Medical History:  Diagnosis Date   Asthma    Cancer ([place]) 1999   Left breast pre cancerous- mastectomy with reconstruction   Hypertension     PAST SURGICAL HISTORY: Past Surgical History:  Procedure Laterality Date   AUGMENTATION MAMMAPLASTY Left 1999   BREAST BIOPSY Right 11/30/2018   affirm stereo bx of calcs, x marker, path pending   BREAST BIOPSY Right 11/30/2018   affirm bx of calcs, ribbon marker,path pending   BREAST LUMPECTOMY WITH NEEDLE LOCALIZATION AND AXILLARY SENTINEL LYMPH NODE BX Right 12/16/2018   Procedure: BREAST LUMPECTOMY WITH NEEDLE LOCALIZATION AND AXILLARY SENTINEL LYMPH NODE BX;  Surgeon: Robert Bellow, MD;  Location: [facility] OR[facility];  Service: General;  Laterality: Right;   KIDNEY CYST REMOVAL     MASTECTOMY Left 1999   Implant   REDUCTION MAMMAPLASTY Right 1997    FAMILY HISTORY: Family History  Problem Relation Age of Onset   Breast cancer Neg Hx     ADVANCED DIRECTIVES (Y/N):  N  HEALTH MAINTENANCE: Social History   Tobacco Use   Smoking status: Never Smoker   Smokeless tobacco: Never Used  Substance Use Topics   Alcohol use: Never  Frequency: Never   Drug use: Never     Colonoscopy:  PAP:  Bone density:  Lipid panel:  Allergies  Allergen Reactions   Cephalexin Other (See Comments)    Possible headache     Current Outpatient Medications  Medication Sig Dispense  Refill   ADVAIR DISKUS 250-50 MCG/DOSE AEPB Inhale 1 puff into the lungs every 12 (twelve) hours.     Biotin (BIOTIN 5000) 5 MG CAPS Take 5 mg by mouth daily.     Cholecalciferol 25 MCG (1000 UT) tablet Take 1,000 Units by mouth daily.     guaiFENesin-dextromethorphan (ROBITUSSIN DM) 100-10 MG/5ML syrup Take 5 mLs by mouth every 4 (four) hours as needed for cough. 118 mL 0   Multiple Vitamins-Minerals (MULTIVITAL PLATINUM SILVER PO) Take 1 tablet by mouth daily.     Multiple Vitamins-Minerals (PRESERVISION AREDS) CAPS Take 1 capsule by mouth 2 (two) times daily.      potassium chloride (K-DUR,KLOR-CON) 10 MEQ tablet Take 10 mEq by mouth 2 (two) times daily.     PROAIR HFA 108 (90 Base) MCG/ACT inhaler Inhale 2 puffs into the lungs every 4 (four) hours as needed for wheezing.     spironolactone (ALDACTONE) 25 MG tablet Take 25 mg by mouth daily.     traMADol (ULTRAM) 50 MG tablet Take 1 tablet (50 mg total) by mouth every 6 (six) hours as needed. 12 tablet 0   latanoprost (XALATAN) 0.005 % ophthalmic solution Apply 1 drop to eye at bedtime.     No current facility-administered medications for this visit.     OBJECTIVE: There were no vitals filed for this visit.   There is no height or weight on file to calculate BMI.    ECOG FS:0 - Asymptomatic  General: Well-developed, well-nourished, no acute distress. HEENT: Normocephalic. Neuro: Alert, answering all questions appropriately. Cranial nerves grossly intact. Psych: Normal affect.  LAB RESULTS:  Lab Results  Component Value Date   NA 141 12/13/2018   K 3.8 12/13/2018   CL 102 12/13/2018   CO2 28 12/13/2018   GLUCOSE 93 12/13/2018   BUN 19 12/13/2018   CREATININE 0.80 12/13/2018   CALCIUM 9.6 12/13/2018   GFRNONAA >60 12/13/2018   GFRAA >60 12/13/2018    Lab Results  Component Value Date   WBC 7.2 05/10/2018   HGB 12.9 05/10/2018   HCT 39.5 05/10/2018   MCV 88.8 05/10/2018   PLT 216 05/10/2018      STUDIES: No results found.  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  PLAN:    1. 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  Although guidelines recommend reexcision giving her close margins, the benefit of this is unclear given patient's advanced age and she ultimately chose not to undergo resection.  She has declined adjuvant treatment with chemotherapy, but would like to pursue single agent Herceptin.  She expressed understanding that this may not be as effective and her chance of recurrence increases.  Patient will require MUGA scan prior to initiating.  We discussed port placement briefly, but this is not necessary at this point.  Proceed with XRT as planned completing treatment on February 23, 2019.  Patient will also benefit from an aromatase inhibitor for a minimum  of 5 years, but given her high risk disease, will consider extended treatment.  Return to clinic the week after Thanksgiving to initiate cycle 1 of single agent Herceptin.     I provided 25 minutes of face-to-face video visit time during this encounter, and > 50% was spent counseling as documented under my assessment & plan.  Patient expressed understanding and was in agreement with this plan. She also understands that She can call clinic at any time with any questions, concerns, or complaints.   Cancer Staging Primary cancer of upper outer quadrant of right female breast [facility]) Staging form: Breast, AJCC 8th Edition - Clinical stage from 12/12/2018: Stage IB (cT2, cN0, cM0, G3, ER+, PR+, HER2+) - Signed by Lloyd Huger, MD on 01/11/2019   Lloyd Huger, MD   01/16/2019 3:36 PM

## 2019-01-16 ENCOUNTER — Inpatient Hospital Stay (HOSPITAL_BASED_OUTPATIENT_CLINIC_OR_DEPARTMENT_OTHER): Payer: PPO | Admitting: Oncology

## 2019-01-16 ENCOUNTER — Other Ambulatory Visit: Payer: Self-pay

## 2019-01-16 ENCOUNTER — Other Ambulatory Visit: Payer: Self-pay | Admitting: Oncology

## 2019-01-16 DIAGNOSIS — C50411 Malignant neoplasm of upper-outer quadrant of right female breast: Secondary | ICD-10-CM

## 2019-01-16 DIAGNOSIS — C50911 Malignant neoplasm of unspecified site of right female breast: Secondary | ICD-10-CM

## 2019-01-16 DIAGNOSIS — Z7189 Other specified counseling: Secondary | ICD-10-CM | POA: Diagnosis not present

## 2019-01-16 NOTE — Progress Notes (Signed)
Patient prescreened for appointment. Patient has no concerns or questions.  

## 2019-01-17 DIAGNOSIS — Z7189 Other specified counseling: Secondary | ICD-10-CM | POA: Insufficient documentation

## 2019-01-17 NOTE — Progress Notes (Signed)
START OFF PATHWAY REGIMEN - Breast   [id number]:Trastuzumab and hyaluronidase-oysk 600 mg/10,000 units SUBQ D1 q21 Days:   A cycle is every 21 days:     Trastuzumab and hyaluronidase-oysk   **Always confirm dose/schedule in your pharmacy ordering system**  Patient Characteristics: Postoperative without Neoadjuvant Therapy (Pathologic Staging), Invasive Disease, Adjuvant Therapy, HER2 Positive, ER Positive, Node Negative, pT2, pN0, Tumor Size ?  3 cm Therapeutic Status: Postoperative without Neoadjuvant Therapy (Pathologic Staging) AJCC Grade: G2 AJCC N Category: pN0 AJCC M Category: cM0 ER Status: Positive (+) AJCC 8 Stage Grouping: IA HER2 Status: Positive (+) Oncotype Dx Recurrence Score: Not Appropriate AJCC T Category: pT2 PR Status: Positive (+) Intent of Therapy: Curative Intent, Discussed with Patient 

## 2019-01-18 ENCOUNTER — Ambulatory Visit
Admission: RE | Admit: 2019-01-18 | Discharge: 2019-01-18 | Disposition: A | Discharge status: Home / Self Care | Payer: PPO | Source: Ambulatory Visit | Attending: Radiation Oncology | Admitting: Radiation Oncology

## 2019-01-18 ENCOUNTER — Other Ambulatory Visit: Payer: Self-pay

## 2019-01-18 DIAGNOSIS — Z170 Estrogen receptor positive status [ER+]: Secondary | ICD-10-CM | POA: Diagnosis not present

## 2019-01-18 DIAGNOSIS — Z510 Encounter for antineoplastic radiation therapy: Secondary | ICD-10-CM | POA: Diagnosis not present

## 2019-01-18 DIAGNOSIS — C50411 Malignant neoplasm of upper-outer quadrant of right female breast: Secondary | ICD-10-CM | POA: Diagnosis not present

## 2019-01-18 NOTE — Patient Instructions (Signed)
Trastuzumab injection for infusion What is this medicine? TRASTUZUMAB (tras TOO zoo mab) is a monoclonal antibody. It is used to treat breast cancer and stomach cancer. This medicine may be used for other purposes; ask your health care provider or pharmacist if you have questions. COMMON BRAND NAME(S): Herceptin, Herzuma, KANJINTI, Ogivri, Ontruzant, Trazimera What should I tell my health care provider before I take this medicine? They need to know if you have any of these conditions:  heart disease  heart failure  lung or breathing disease, like asthma  an unusual or allergic reaction to trastuzumab, benzyl alcohol, or other medications, foods, dyes, or preservatives  pregnant or trying to get pregnant  breast-feeding How should I use this medicine? This drug is given as an infusion into a vein. It is administered in a hospital or clinic by a specially trained health care professional. Talk to your pediatrician regarding the use of this medicine in children. This medicine is not approved for use in children. Overdosage: If you think you have taken too much of this medicine contact a poison control center or emergency room at once. NOTE: This medicine is only for you. Do not share this medicine with others. What if I miss a dose? It is important not to miss a dose. Call your doctor or health care professional if you are unable to keep an appointment. What may interact with this medicine? This medicine may interact with the following medications:  certain types of chemotherapy, such as daunorubicin, doxorubicin, epirubicin, and idarubicin This list may not describe all possible interactions. Give your health care provider a list of all the medicines, herbs, non-prescription drugs, or dietary supplements you use. Also tell them if you smoke, drink alcohol, or use illegal drugs. Some items may interact with your medicine. What should I watch for while using this medicine? Visit your  doctor for checks on your progress. Report any side effects. Continue your course of treatment even though you feel ill unless your doctor tells you to stop. Call your doctor or health care professional for advice if you get a fever, chills or sore throat, or other symptoms of a cold or flu. Do not treat yourself. Try to avoid being around people who are sick. You may experience fever, chills and shaking during your first infusion. These effects are usually mild and can be treated with other medicines. Report any side effects during the infusion to your health care professional. Fever and chills usually do not happen with later infusions. Do not become pregnant while taking this medicine or for 7 months after stopping it. Women should inform their doctor if they wish to become pregnant or think they might be pregnant. Women of child-bearing potential will need to have a negative pregnancy test before starting this medicine. There is a potential for serious side effects to an unborn child. Talk to your health care professional or pharmacist for more information. Do not breast-feed an infant while taking this medicine or for 7 months after stopping it. Women must use effective birth control with this medicine. What side effects may I notice from receiving this medicine? Side effects that you should report to your doctor or health care professional as soon as possible:  allergic reactions like skin rash, itching or hives, swelling of the face, lips, or tongue  chest pain or palpitations  cough  dizziness  feeling faint or lightheaded, falls  fever  general ill feeling or flu-like symptoms  signs of worsening heart failure like   breathing problems; swelling in your legs and feet  unusually weak or tired Side effects that usually do not require medical attention (report to your doctor or health care professional if they continue or are bothersome):  bone pain  changes in  taste  diarrhea  joint pain  nausea/vomiting  weight loss This list may not describe all possible side effects. Call your doctor for medical advice about side effects. You may report side effects to FDA at 1-800-FDA-1088. Where should I keep my medicine? This drug is given in a hospital or clinic and will not be stored at home. NOTE: This sheet is a summary. It may not cover all possible information. If you have questions about this medicine, talk to your doctor, pharmacist, or health care provider.  2020 Elsevier/Gold Standard (2016-02-11 14:37:52)  

## 2019-01-19 ENCOUNTER — Other Ambulatory Visit: Payer: Self-pay

## 2019-01-19 ENCOUNTER — Ambulatory Visit
Admission: RE | Admit: 2019-01-19 | Discharge: 2019-01-19 | Disposition: A | Discharge status: Home / Self Care | Payer: PPO | Source: Ambulatory Visit | Attending: Radiation Oncology | Admitting: Radiation Oncology

## 2019-01-19 DIAGNOSIS — C50411 Malignant neoplasm of upper-outer quadrant of right female breast: Secondary | ICD-10-CM | POA: Diagnosis not present

## 2019-01-19 DIAGNOSIS — Z510 Encounter for antineoplastic radiation therapy: Secondary | ICD-10-CM | POA: Diagnosis not present

## 2019-01-19 DIAGNOSIS — Z170 Estrogen receptor positive status [ER+]: Secondary | ICD-10-CM | POA: Diagnosis not present

## 2019-01-20 ENCOUNTER — Ambulatory Visit: Payer: PPO | Admitting: Oncology

## 2019-01-20 ENCOUNTER — Ambulatory Visit
Admission: RE | Admit: 2019-01-20 | Discharge: 2019-01-20 | Disposition: A | Discharge status: Home / Self Care | Payer: PPO | Source: Ambulatory Visit | Attending: Radiation Oncology | Admitting: Radiation Oncology

## 2019-01-20 ENCOUNTER — Other Ambulatory Visit: Payer: Self-pay

## 2019-01-20 DIAGNOSIS — Z170 Estrogen receptor positive status [ER+]: Secondary | ICD-10-CM | POA: Diagnosis not present

## 2019-01-20 DIAGNOSIS — Z510 Encounter for antineoplastic radiation therapy: Secondary | ICD-10-CM | POA: Diagnosis not present

## 2019-01-20 DIAGNOSIS — C50411 Malignant neoplasm of upper-outer quadrant of right female breast: Secondary | ICD-10-CM | POA: Diagnosis not present

## 2019-01-23 ENCOUNTER — Inpatient Hospital Stay (HOSPITAL_BASED_OUTPATIENT_CLINIC_OR_DEPARTMENT_OTHER): Payer: PPO | Admitting: Oncology

## 2019-01-23 ENCOUNTER — Other Ambulatory Visit: Payer: Self-pay

## 2019-01-23 ENCOUNTER — Ambulatory Visit
Admission: RE | Admit: 2019-01-23 | Discharge: 2019-01-23 | Disposition: A | Discharge status: Home / Self Care | Payer: PPO | Source: Ambulatory Visit | Attending: Radiation Oncology | Admitting: Radiation Oncology

## 2019-01-23 ENCOUNTER — Inpatient Hospital Stay: Payer: PPO

## 2019-01-23 DIAGNOSIS — Z510 Encounter for antineoplastic radiation therapy: Secondary | ICD-10-CM | POA: Diagnosis not present

## 2019-01-23 DIAGNOSIS — C50411 Malignant neoplasm of upper-outer quadrant of right female breast: Secondary | ICD-10-CM | POA: Diagnosis not present

## 2019-01-23 DIAGNOSIS — C50911 Malignant neoplasm of unspecified site of right female breast: Secondary | ICD-10-CM

## 2019-01-23 NOTE — Progress Notes (Signed)
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  Chief complaint/Reason for visit- Initial Meeting for [facility], preparing for starting chemotherapy  Heme/Onc history:  Oncology History  Primary cancer of upper outer quadrant of right female breast ([place])  12/12/2018 Initial Diagnosis   Primary cancer of upper outer quadrant of right female breast ([place])   12/12/2018 Cancer Staging   Staging form: Breast, AJCC 8th Edition - Clinical stage from 12/12/2018: Stage IB (cT2, cN0, cM0, G3, ER+, PR+, HER2+) - Signed by Lloyd Huger, MD on 01/11/2019   01/31/2019 -  Chemotherapy   The patient had trastuzumab-dkst (OGIVRI) 462 mg in sodium chloride 0.9 % 250 mL chemo infusion, 8 mg/kg = 462 mg, Intravenous,  Once, 0 of 18 cycles  for chemotherapy treatment.      Interval history-[name] is a 85 year old female who presents to chemo care clinic today for initial meeting in preparation for starting chemotherapy. I introduced the chemo care clinic and we discussed that the role of the clinic is to assist those who are at an increased risk of emergency room visits and/or complications during the course of chemotherapy treatment. We discussed that the increased risk takes into account factors such as age, performance status, and co-morbidities. We also discussed that for some, this might include barriers to care such as not having a primary care provider, lack  of insurance/transportation, or not being able to afford medications. We discussed that the goal of the program is to help prevent unplanned ER visits and help reduce complications during chemotherapy. We do this by discussing specific risk factors to each individual and identifying ways that we can help improve these risk factors and reduce barriers to care.   ECOG FS:0 - Asymptomatic  Review of systems- Review of Systems  Constitutional: Negative.  Negative for chills, fever, malaise/fatigue and weight loss.  HENT: Negative for congestion, ear pain and tinnitus.   Eyes: Negative.  Negative for blurred vision and double vision.  Respiratory: Negative.  Negative for cough, sputum production and shortness of breath.   Cardiovascular: Negative.  Negative for chest pain, palpitations and leg swelling.  Gastrointestinal: Negative.  Negative for abdominal pain, constipation, diarrhea, nausea and vomiting.  Genitourinary: Negative for dysuria, frequency and urgency.  Musculoskeletal: Negative for back pain and falls.  Skin: Negative.  Negative for rash.  Neurological: Negative.  Negative for weakness and headaches.  Endo/Heme/Allergies: Negative.  Does not bruise/bleed easily.  Psychiatric/Behavioral: Negative.  Negative for depression. The patient is not nervous/anxious and does not have insomnia.      Current treatment- Radiation and Herceptin only  Allergies  Allergen Reactions   Cephalexin Other (See Comments)    Possible headache     Past Medical History:  Diagnosis Date   Asthma    Cancer ([place]) 1999   Left breast pre cancerous- mastectomy with reconstruction   Hypertension     Past Surgical History:  Procedure Laterality Date   AUGMENTATION MAMMAPLASTY Left 1999   BREAST  BIOPSY Right 11/30/2018   affirm stereo bx of calcs, x marker, path pending   BREAST BIOPSY Right 11/30/2018   affirm bx of calcs, ribbon marker,path pending   BREAST LUMPECTOMY WITH NEEDLE  LOCALIZATION AND AXILLARY SENTINEL LYMPH NODE BX Right 12/16/2018   Procedure: BREAST LUMPECTOMY WITH NEEDLE LOCALIZATION AND AXILLARY SENTINEL LYMPH NODE BX;  Surgeon: Robert Bellow, MD;  Location: [facility] ORS;  Service: General;  Laterality: Right;   KIDNEY CYST REMOVAL     MASTECTOMY Left 1999   Implant   REDUCTION MAMMAPLASTY Right 1997    Social History   Socioeconomic History   Marital status: Widowed    Spouse name: Not on file   Number of children: Not on file   Years of education: Not on file   Highest education level: Not on file  Occupational History   Not on file  Social Needs   Financial resource strain: Not on file   Food insecurity    Worry: Not on file    Inability: Not on file   Transportation needs    Medical: Not on file    Non-medical: Not on file  Tobacco Use   Smoking status: Never Smoker   Smokeless tobacco: Never Used  Substance and Sexual Activity   Alcohol use: Never    Frequency: Never   Drug use: Never   Sexual activity: Not on file  Lifestyle   Physical activity    Days per week: Not on file    Minutes per session: Not on file   Stress: Not on file  Relationships   Social connections    Talks on phone: Not on file    Gets together: Not on file    Attends religious service: Not on file    Active member of club or organization: Not on file    Attends meetings of clubs or organizations: Not on file    Relationship status: Not on file   Intimate partner violence    Fear of current or ex partner: Not on file    Emotionally abused: Not on file    Physically abused: Not on file    Forced sexual activity: Not on file  Other Topics Concern   Not on file  Social History Narrative   Not on file    Family History  Problem Relation Age of Onset   Breast cancer Neg Hx      Current Outpatient Medications:    ADVAIR DISKUS 250-50 MCG/DOSE AEPB, Inhale 1 puff into the lungs every 12 (twelve) hours., Disp: , Rfl:      Biotin (BIOTIN 5000) 5 MG CAPS, Take 5 mg by mouth daily., Disp: , Rfl:    Cholecalciferol 25 MCG (1000 UT) tablet, Take 1,000 Units by mouth daily., Disp: , Rfl:    guaiFENesin-dextromethorphan (ROBITUSSIN DM) 100-10 MG/5ML syrup, Take 5 mLs by mouth every 4 (four) hours as needed for cough., Disp: 118 mL, Rfl: 0   latanoprost (XALATAN) 0.005 % ophthalmic solution, Apply 1 drop to eye at bedtime., Disp: , Rfl:    Multiple Vitamins-Minerals (MULTIVITAL PLATINUM SILVER PO), Take 1 tablet by mouth daily., Disp: , Rfl:    Multiple Vitamins-Minerals (PRESERVISION AREDS) CAPS, Take 1 capsule by mouth 2 (two) times daily. , Disp: , Rfl:    potassium chloride (K-DUR,KLOR-CON) 10 MEQ tablet, Take 10 mEq by mouth 2 (two) times daily., Disp: , Rfl:    PROAIR HFA 108 (90 Base) MCG/ACT inhaler, Inhale 2 puffs into the lungs every 4 (  four) hours as needed for wheezing., Disp: , Rfl:    spironolactone (ALDACTONE) 25 MG tablet, Take 25 mg by mouth daily., Disp: , Rfl:    traMADol (ULTRAM) 50 MG tablet, Take 1 tablet (50 mg total) by mouth every 6 (six) hours as needed., Disp: 12 tablet, Rfl: 0  Physical exam: There were no vitals filed for this visit. Physical Exam Constitutional:      Appearance: Normal appearance.  HENT:     Head: Normocephalic and atraumatic.  Eyes:     Pupils: Pupils are equal, round, and reactive to light.  Neck:     Musculoskeletal: Normal range of motion.  Cardiovascular:     Rate and Rhythm: Normal rate and regular rhythm.     Heart sounds: Normal heart sounds. No murmur.  Pulmonary:     Effort: Pulmonary effort is normal.     Breath sounds: Normal breath sounds. No wheezing.  Abdominal:     General: Bowel sounds are normal. There is no distension.     Palpations: Abdomen is soft.     Tenderness: There is no abdominal tenderness.  Musculoskeletal: Normal range of motion.  Skin:    General: Skin is warm and dry.     Findings: No rash.  Neurological:      Mental Status: She is alert and oriented to person, place, and time.  Psychiatric:        Judgment: Judgment normal.      CMP Latest Ref Rng & Units 12/13/2018  Glucose 70 - 99 mg/dL 93  BUN 8 - 23 mg/dL 19  Creatinine 0.44 - 1.00 mg/dL 0.80  Sodium 135 - 145 mmol/L 141  Potassium 3.5 - 5.1 mmol/L 3.8  Chloride 98 - 111 mmol/L 102  CO2 22 - 32 mmol/L 28  Calcium 8.9 - 10.3 mg/dL 9.6   CBC Latest Ref Rng & Units 05/10/2018  WBC 4.0 - 10.5 K/uL 7.2  Hemoglobin 12.0 - 15.0 g/dL 12.9  Hematocrit 36.0 - 46.0 % 39.5  Platelets 150 - 400 K/uL 216    No images are attached to the encounter.  No results found. Assessment and plan- Patient is a 85 y.o. female who presents to [facility] for initial meeting in preparation for starting chemotherapy for the treatment of stage Ib triple negative breast cancer.   1. HPI: Patient is an 85 year old female status post left modified radical mastectomy with implant approximately 30 years ago.  Presented with abnormal calcifications of right breast in lower outer quadrant showing interim determinate loosely grouped calcifications.  She underwent stereotactic core biopsy of this calcifications which were positive for invasive mammary carcinoma.  She then underwent a right wide local excision showing overall grade 3 invasive mammary carcinoma with margins clear but close at 1 mm.  There was ductal carcinoma also involved and inferior margins were positive at less than 1 mm.  2 sentinel lymph nodes were reviewed showing no evidence of metastatic disease.  Tumor sizes were 1.5 cm and 1.3 cm in greatest dimension.  Patient has done well postop.  She has been seen by medical oncology who has ordered Oncotype DX testing as well as Dr. Tollie Pizza who is in favor of doing no more surgery but going ahead with radiation therapy with a boost to her breast.  2.  2. Chemo Care Clinic/High Risk for ER/Hospitalization during chemotherapy- We discussed the role of the  chemo care clinic and identified patient specific risk factors. I discussed that patient was identified as  high risk primarily based on comorbidities and patient's age.  Risk of Admission or ED Visit This score indicates an adult patient's 1-year risk, as a percentage, of a hospital admission or ED visit. Current PCP: Kirk Ruths, MD  Hospital Admissions: 3   ED Visits: 1   Has Medicaid: No  Has Medicare: Yes  In relationship: No  Has Anemia: No  Has asthma: Yes   Has atrial fibrillation: No  Has CVD: No  Has chronic kidney disease: Yes   Has Chronic Obstructive Pulmonary Disease: No  Has Congestive Heart Failure: No  Has Connective Tissue Disorder: No  Has Depression: No  Has Diabetes: No  Has liver disease: No  Has Peripheral Vascular Disease: No    She has a past medical history positive for: Past Medical History:  Diagnosis Date   Asthma    Cancer ([place]) 1999   Left breast pre cancerous- mastectomy with reconstruction   Hypertension     She has a past surgical history positive for: Past Surgical History:  Procedure Laterality Date   AUGMENTATION MAMMAPLASTY Left 1999   BREAST BIOPSY Right 11/30/2018   affirm stereo bx of calcs, x marker, path pending   BREAST BIOPSY Right 11/30/2018   affirm bx of calcs, ribbon marker,path pending   BREAST LUMPECTOMY WITH NEEDLE LOCALIZATION AND AXILLARY SENTINEL LYMPH NODE BX Right 12/16/2018   Procedure: BREAST LUMPECTOMY WITH NEEDLE LOCALIZATION AND AXILLARY SENTINEL LYMPH NODE BX;  Surgeon: Robert Bellow, MD;  Location: [facility] ORS;  Service: General;  Laterality: Right;   KIDNEY CYST REMOVAL     MASTECTOMY Left [place]   She currently lives at home alone.  She has reliable transportation.  Her daughter has accompanied her to chemo care.  3. Social Determinants of Health- we discussed that social determinants of health may have significant impacts on health and  outcomes for cancer patients.  Today we discussed specific social determinants of performance status, alcohol use, depression, financial needs, food insecurity, housing, interpersonal violence, social connections, stress, tobacco use, and transportation.  After lengthy discussion we did not identify any areas of need.  We did review the following:  We discussed options including home based and outpatient services, DME, and CARE program. We discusssed that patients who participate in regular physical activity report fewer negative impacts of cancer and treatments and report less fatigue.   Wdiscussed self-referral to [facility] for counseling services, psychiatry for medication management, or palliative care/symptom management as well as primary care providers.   We discussed that living with cancer can create tremendous financial burden.  We discussed options for assistance. I asked that if assistance is needed in affording medications or paying bills to please let us know so that we can provide assistance.   We discussed options for food including social services.  We will also notify [facility] to see if cancer center can provide support.  Patient informed of food pantry at cancer center and was provided with care package today.  Please notify nursing if un-met needs.  We discussed referral to social work. Will also discuss with Elease Etienne to see if [facility] can provide support of utility bills, etc.   We discussed options for support groups at the cancer center. If interested, please notify nurse navigator to enroll.   We discussed options for managing stress including healthy eating, exercise as well as participating in no charge counseling services at the cancer center and  support groups.  If these are of interest, patient can notify either myself or primary nursing team.  We discussed options for transportation including [employer], paratransit, bus routes, link transit, taxi/uber/lyft, and  cancer center van.  I have notified primary oncology team who will help assist with arranging Lucianne Lei transportation for appointments when needed. We also discussed options for transportation on short notice/acute visits.   4. Co-morbidities Complicating Care:  Past Medical History:  Diagnosis Date   Asthma    Cancer ([place]) 1999   Left breast pre cancerous- mastectomy with reconstruction   Hypertension     We also discussed the role of the Symptom Management Clinic at [facility] for acute issues and methods of contacting clinic/provider. She denies needing specific assistance at this time and She will be followed by Tanya Nones, RN (Nurse Navigator).   Visit Diagnosis 1. Malignant neoplasm of right female breast, unspecified estrogen receptor status, unspecified site of breast ([place])   2. Primary cancer of upper outer quadrant of right female breast [facility])      Patient expressed understanding and was in agreement with this plan. She also understands that She can call clinic at any time with any questions, concerns, or complaints.   A total of (25) minutes of face-to-face time was spent with this patient with greater than 50% of that time in counseling and care-coordination.  Rulon Abide NP, AGNP-C [facility] [place] [facility]: Dr. Grayland Ormond

## 2019-01-24 ENCOUNTER — Encounter
Admission: RE | Admit: 2019-01-24 | Discharge: 2019-01-24 | Disposition: A | Discharge status: Home / Self Care | Payer: PPO | Source: Ambulatory Visit | Attending: Oncology | Admitting: Oncology

## 2019-01-24 ENCOUNTER — Other Ambulatory Visit: Payer: Self-pay

## 2019-01-24 ENCOUNTER — Ambulatory Visit
Admission: RE | Admit: 2019-01-24 | Discharge: 2019-01-24 | Disposition: A | Discharge status: Home / Self Care | Payer: PPO | Source: Ambulatory Visit | Attending: Radiation Oncology | Admitting: Radiation Oncology

## 2019-01-24 DIAGNOSIS — Z170 Estrogen receptor positive status [ER+]: Secondary | ICD-10-CM | POA: Diagnosis not present

## 2019-01-24 DIAGNOSIS — J45909 Unspecified asthma, uncomplicated: Secondary | ICD-10-CM | POA: Insufficient documentation

## 2019-01-24 DIAGNOSIS — C50919 Malignant neoplasm of unspecified site of unspecified female breast: Secondary | ICD-10-CM | POA: Diagnosis not present

## 2019-01-24 DIAGNOSIS — C50911 Malignant neoplasm of unspecified site of right female breast: Secondary | ICD-10-CM

## 2019-01-24 DIAGNOSIS — Z01810 Encounter for preprocedural cardiovascular examination: Secondary | ICD-10-CM | POA: Diagnosis not present

## 2019-01-24 DIAGNOSIS — I10 Essential (primary) hypertension: Secondary | ICD-10-CM | POA: Diagnosis not present

## 2019-01-24 DIAGNOSIS — Z79899 Other long term (current) drug therapy: Secondary | ICD-10-CM | POA: Insufficient documentation

## 2019-01-24 DIAGNOSIS — Z510 Encounter for antineoplastic radiation therapy: Secondary | ICD-10-CM | POA: Diagnosis not present

## 2019-01-24 DIAGNOSIS — C50411 Malignant neoplasm of upper-outer quadrant of right female breast: Secondary | ICD-10-CM | POA: Diagnosis not present

## 2019-01-24 MED ORDER — TECHNETIUM TC 99M-LABELED RED BLOOD CELLS IV KIT
20.0000 | PACK | Freq: Once | INTRAVENOUS | Status: AC | PRN
  Administered 2019-01-24: 22.867 via INTRAVENOUS

## 2019-01-25 ENCOUNTER — Ambulatory Visit
Admission: RE | Admit: 2019-01-25 | Discharge: 2019-01-25 | Disposition: A | Discharge status: Home / Self Care | Payer: PPO | Source: Ambulatory Visit | Attending: Radiation Oncology | Admitting: Radiation Oncology

## 2019-01-25 ENCOUNTER — Other Ambulatory Visit: Payer: Self-pay

## 2019-01-25 ENCOUNTER — Inpatient Hospital Stay: Payer: PPO

## 2019-01-25 DIAGNOSIS — C50411 Malignant neoplasm of upper-outer quadrant of right female breast: Secondary | ICD-10-CM | POA: Diagnosis not present

## 2019-01-25 DIAGNOSIS — Z510 Encounter for antineoplastic radiation therapy: Secondary | ICD-10-CM | POA: Diagnosis not present

## 2019-01-25 DIAGNOSIS — C50911 Malignant neoplasm of unspecified site of right female breast: Secondary | ICD-10-CM

## 2019-01-25 DIAGNOSIS — Z170 Estrogen receptor positive status [ER+]: Secondary | ICD-10-CM | POA: Diagnosis not present

## 2019-01-25 LAB — CBC
HCT: 42 % (ref 36.0–46.0)
Hemoglobin: 13.6 g/dL (ref 12.0–15.0)
MCH: 29.3 pg (ref 26.0–34.0)
MCHC: 32.4 g/dL (ref 30.0–36.0)
MCV: 90.5 fL (ref 80.0–100.0)
Platelets: 263 10*3/uL (ref 150–400)
RBC: 4.64 MIL/uL (ref 3.87–5.11)
RDW: 13.9 % (ref 11.5–15.5)
WBC: 10.6 10*3/uL — ABNORMAL HIGH (ref 4.0–10.5)
nRBC: 0 % (ref 0.0–0.2)

## 2019-01-25 NOTE — Progress Notes (Signed)
[place]  [phone] Fax:([phone]  ID: Dagoberto Ligas OB: Feb 15, 1928  MR#: [id number]  WNU#:[id number]  Patient Care Team: Kirk Ruths, MD as PCP - General (Internal Medicine) Rico Junker, RN as Registered Nurse Rico Junker, RN as Registered Nurse Rico Junker, RN as Registered Nurse   CHIEF COMPLAINT: Clinical stage IB triple positive invasive carcinoma of the upper outer quadrant of the right breast, 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  INTERVAL HISTORY: Patient returns to clinic today for further evaluation and consideration of cycle 1 of maintenance Herceptin.  She is currently receiving adjuvant XRT and tolerating treatment well without significant side effects.  She currently feels well and is asymptomatic.  She has no neurologic complaints.  She denies any recent fevers or illnesses.  She has a good appetite and denies weight loss.  She has no chest pain, shortness of breath, cough, or hemoptysis.  She denies any nausea, vomiting, constipation, or diarrhea.  She has no urinary complaints.  Patient offers no specific complaints today.  REVIEW OF SYSTEMS:   Review of Systems  Constitutional: Negative.  Negative for fever, malaise/fatigue and weight loss.  Respiratory: Negative.  Negative for cough, hemoptysis and shortness of breath.   Cardiovascular: Negative.  Negative for chest pain and leg swelling.  Gastrointestinal: Negative.  Negative for abdominal pain.  Genitourinary: Negative.  Negative for dysuria.  Musculoskeletal: Negative.  Negative for back pain.  Skin: Negative.  Negative for rash.  Neurological: Negative.  Negative for dizziness, focal weakness, weakness and headaches.  Psychiatric/Behavioral: Negative.  The patient is not nervous/anxious.     As per HPI. Otherwise, a complete review of systems is negative.  PAST MEDICAL HISTORY: Past Medical History:  Diagnosis Date   . Asthma   . Cancer [facility]) 1999   Left breast pre cancerous- mastectomy with reconstruction  . Hypertension     PAST SURGICAL HISTORY: Past Surgical History:  Procedure Laterality Date  . AUGMENTATION MAMMAPLASTY Left 1999  . BREAST BIOPSY Right 11/30/2018   affirm stereo bx of calcs, x marker, path pending  . BREAST BIOPSY Right 11/30/2018   affirm bx of calcs, ribbon marker,path pending  . BREAST LUMPECTOMY WITH NEEDLE LOCALIZATION AND AXILLARY SENTINEL LYMPH NODE BX Right 12/16/2018   Procedure: BREAST LUMPECTOMY WITH NEEDLE LOCALIZATION AND AXILLARY SENTINEL LYMPH NODE BX;  Surgeon: Robert Bellow, MD;  Location: [facility] ORS;  Service: General;  Laterality: Right;  . KIDNEY CYST REMOVAL    . MASTECTOMY Left 1999   Implant  . REDUCTION MAMMAPLASTY Right 1997    FAMILY HISTORY: Family History  Problem Relation Age of Onset  . Breast cancer Neg Hx     ADVANCED DIRECTIVES (Y/N):  N  HEALTH MAINTENANCE: Social History   Tobacco Use  . Smoking status: Never Smoker  . Smokeless tobacco: Never Used  Substance Use Topics  . Alcohol use: Never    Frequency: Never  . Drug use: Never     Colonoscopy:  PAP:  Bone density:  Lipid panel:  Allergies  Allergen Reactions  . Cephalexin Other (See Comments)    Possible headache     Current Outpatient Medications  Medication Sig Dispense Refill  . ADVAIR DISKUS 250-50 MCG/DOSE AEPB Inhale 1 puff into the lungs every 12 (twelve) hours.    . Biotin (BIOTIN 5000) 5 MG CAPS Take 5 mg by mouth daily.    . Cholecalciferol 25 MCG (1000 UT) tablet Take 1,000  Units by mouth daily.    Marland Kitchen guaiFENesin-dextromethorphan (ROBITUSSIN DM) 100-10 MG/5ML syrup Take 5 mLs by mouth every 4 (four) hours as needed for cough. 118 mL 0  . latanoprost (XALATAN) 0.005 % ophthalmic solution Apply 1 drop to eye at bedtime.    . Multiple Vitamins-Minerals (MULTIVITAL PLATINUM SILVER PO) Take 1 tablet by mouth daily.    . Multiple Vitamins-Minerals  (PRESERVISION AREDS) CAPS Take 1 capsule by mouth 2 (two) times daily.     . potassium chloride (K-DUR,KLOR-CON) 10 MEQ tablet Take 10 mEq by mouth 2 (two) times daily.    Marland Kitchen PROAIR HFA 108 (90 Base) MCG/ACT inhaler Inhale 2 puffs into the lungs every 4 (four) hours as needed for wheezing.    Marland Kitchen spironolactone (ALDACTONE) 25 MG tablet Take 25 mg by mouth daily.    . traMADol (ULTRAM) 50 MG tablet Take 1 tablet (50 mg total) by mouth every 6 (six) hours as needed. 12 tablet 0   No current facility-administered medications for this visit.     OBJECTIVE: Vitals:   01/31/19 1000  BP: (!) 160/79  Pulse: 82  Resp: (!) 22  Temp: (!) 96.6 F (35.9 C)  SpO2: 97%     Body mass index is 21.9 kg/m.    ECOG FS:0 - Asymptomatic  General: Well-developed, well-nourished, no acute distress. Eyes: Pink conjunctiva, anicteric sclera. HEENT: Normocephalic, moist mucous membranes. Breasts: Exam deferred today. Lungs: Clear to auscultation bilaterally. Heart: Regular rate and rhythm. No rubs, murmurs, or gallops. Abdomen: Soft, nontender, nondistended. No organomegaly noted, normoactive bowel sounds. Musculoskeletal: No edema, cyanosis, or clubbing. Neuro: Alert, answering all questions appropriately. Cranial nerves grossly intact. Skin: No rashes or petechiae noted. Psych: Normal affect.  LAB RESULTS:  Lab Results  Component Value Date   NA 141 12/13/2018   K 3.8 12/13/2018   CL 102 12/13/2018   CO2 28 12/13/2018   GLUCOSE 93 12/13/2018   BUN 19 12/13/2018   CREATININE 0.80 12/13/2018   CALCIUM 9.6 12/13/2018   GFRNONAA >60 12/13/2018   GFRAA >60 12/13/2018    Lab Results  Component Value Date   WBC 7.7 01/31/2019   HGB 13.7 01/31/2019   HCT 42.8 01/31/2019   MCV 91.8 01/31/2019   PLT 254 01/31/2019     STUDIES: Nm Cardiac Muga Rest  Result Date: 01/24/2019 CLINICAL DATA:  Breast cancer. Evaluate cardiac function in relation to chemotherapy. EXAM: NUCLEAR MEDICINE CARDIAC  BLOOD POOL IMAGING (MUGA) TECHNIQUE: Cardiac multi-gated acquisition was performed at rest following intravenous injection of Tc-71mlabeled red blood cells. RADIOPHARMACEUTICALS:  20.8 mCi Tc-964mertechnetate in-vitro labeled red blood cells IV COMPARISON:  None. FINDINGS: No  focal wall motion abnormality of the left ventricle. Calculated left ventricular ejection fraction equals 62.5 % IMPRESSION: Left ventricular ejection fraction equals62.5 %. Electronically Signed   By: StSuzy Bouchard.D.   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  PLAN:    1. 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  Although guidelines recommend reexcision given her close margins, the benefit of this is unclear given patient's advanced age and she ultimately chose not to undergo resection.  She has declined adjuvant treatment with chemotherapy, but would  like to pursue single agent Herceptin.  She expressed understanding that this may not be as effective and her chance of recurrence increases.  MUGA scan on January 24, 2019 revealed an EF of 64.5%.  Patient continues to decline port placement at this time.  Continue daily XRT, completing treatment on February 23, 2019.  Patient will also benefit from an aromatase inhibitor for a minimum of 5 years, but given her high risk disease, will consider extended treatment.  Will initiate treatment after next clinic visit.  Proceed with cycle 1 of single agent Herceptin today.  Patient has declined premedications despite acknowledging the increased risk of reaction.  Return to clinic in 3 weeks for further evaluation and consideration of cycle 2.   I spent a  total of 30 minutes face-to-face with the patient of which greater than 50% of the visit was spent in counseling and coordination of care as detailed above.  Patient expressed understanding and was in agreement with this plan. She also understands that She can call clinic at any time with any questions, concerns, or complaints.   Cancer Staging Primary cancer of upper outer quadrant of right female breast [facility]) Staging form: Breast, AJCC 8th Edition - Clinical stage from 12/12/2018: Stage IB (cT2, cN0, cM0, G3, ER+, PR+, HER2+) - Signed by Lloyd Huger, MD on 01/11/2019   Lloyd Huger, MD   01/31/2019 5:25 PM

## 2019-01-30 ENCOUNTER — Encounter: Payer: Self-pay | Admitting: Oncology

## 2019-01-30 ENCOUNTER — Other Ambulatory Visit: Payer: Self-pay

## 2019-01-30 ENCOUNTER — Ambulatory Visit
Admission: RE | Admit: 2019-01-30 | Discharge: 2019-01-30 | Disposition: A | Discharge status: Home / Self Care | Payer: PPO | Source: Ambulatory Visit | Attending: Radiation Oncology | Admitting: Radiation Oncology

## 2019-01-30 DIAGNOSIS — Z510 Encounter for antineoplastic radiation therapy: Secondary | ICD-10-CM | POA: Diagnosis not present

## 2019-01-30 DIAGNOSIS — C50411 Malignant neoplasm of upper-outer quadrant of right female breast: Secondary | ICD-10-CM | POA: Diagnosis not present

## 2019-01-30 DIAGNOSIS — Z170 Estrogen receptor positive status [ER+]: Secondary | ICD-10-CM | POA: Diagnosis not present

## 2019-01-30 NOTE — Progress Notes (Signed)
Patient prescreened for appointment. Patient has no concerns or questions.  

## 2019-01-31 ENCOUNTER — Other Ambulatory Visit: Payer: Self-pay

## 2019-01-31 ENCOUNTER — Ambulatory Visit
Admission: RE | Admit: 2019-01-31 | Discharge: 2019-01-31 | Disposition: A | Discharge status: Home / Self Care | Payer: PPO | Source: Ambulatory Visit | Attending: Radiation Oncology | Admitting: Radiation Oncology

## 2019-01-31 ENCOUNTER — Inpatient Hospital Stay: Payer: PPO | Attending: Oncology

## 2019-01-31 ENCOUNTER — Inpatient Hospital Stay (HOSPITAL_BASED_OUTPATIENT_CLINIC_OR_DEPARTMENT_OTHER): Payer: PPO | Admitting: Oncology

## 2019-01-31 ENCOUNTER — Inpatient Hospital Stay: Payer: PPO

## 2019-01-31 VITALS — BP 160/79 | HR 82 | Temp 96.6°F | Resp 22 | Wt 127.6 lb

## 2019-01-31 DIAGNOSIS — C50911 Malignant neoplasm of unspecified site of right female breast: Secondary | ICD-10-CM

## 2019-01-31 DIAGNOSIS — Z5112 Encounter for antineoplastic immunotherapy: Secondary | ICD-10-CM | POA: Diagnosis not present

## 2019-01-31 DIAGNOSIS — Z510 Encounter for antineoplastic radiation therapy: Secondary | ICD-10-CM | POA: Diagnosis not present

## 2019-01-31 DIAGNOSIS — C50411 Malignant neoplasm of upper-outer quadrant of right female breast: Secondary | ICD-10-CM | POA: Insufficient documentation

## 2019-01-31 DIAGNOSIS — C773 Secondary and unspecified malignant neoplasm of axilla and upper limb lymph nodes: Secondary | ICD-10-CM | POA: Diagnosis not present

## 2019-01-31 DIAGNOSIS — Z170 Estrogen receptor positive status [ER+]: Secondary | ICD-10-CM | POA: Diagnosis not present

## 2019-01-31 LAB — CBC
HCT: 42.8 % (ref 36.0–46.0)
Hemoglobin: 13.7 g/dL (ref 12.0–15.0)
MCH: 29.4 pg (ref 26.0–34.0)
MCHC: 32 g/dL (ref 30.0–36.0)
MCV: 91.8 fL (ref 80.0–100.0)
Platelets: 254 10*3/uL (ref 150–400)
RBC: 4.66 MIL/uL (ref 3.87–5.11)
RDW: 13.9 % (ref 11.5–15.5)
WBC: 7.7 10*3/uL (ref 4.0–10.5)
nRBC: 0 % (ref 0.0–0.2)

## 2019-01-31 MED ORDER — TRASTUZUMAB-DKST CHEMO 150 MG IV SOLR
450.0000 mg | Freq: Once | INTRAVENOUS | Status: AC
  Administered 2019-01-31: 450 mg via INTRAVENOUS
  Filled 2019-01-31: qty 21.43

## 2019-01-31 MED ORDER — SODIUM CHLORIDE 0.9 % IV SOLN
Freq: Once | INTRAVENOUS | Status: AC
  Administered 2019-01-31: 11:00:00 via INTRAVENOUS
  Filled 2019-01-31: qty 250

## 2019-01-31 MED ORDER — ACETAMINOPHEN 325 MG PO TABS: 650.0000 mg | ORAL_TABLET | Freq: Once | ORAL | Status: DC

## 2019-01-31 MED ORDER — DIPHENHYDRAMINE HCL 25 MG PO CAPS: 25.0000 mg | ORAL_CAPSULE | Freq: Once | ORAL | Status: DC

## 2019-01-31 NOTE — Progress Notes (Signed)
Pt refusing ordered premeds despite education on importance. Dr. Grayland Ormond made aware and states ok for pt to receive treatment at this time.

## 2019-01-31 NOTE — Progress Notes (Signed)
Patient here for a follow-up doing well with no concerns today.

## 2019-02-01 ENCOUNTER — Ambulatory Visit
Admission: RE | Admit: 2019-02-01 | Discharge: 2019-02-01 | Disposition: A | Discharge status: Home / Self Care | Payer: PPO | Source: Ambulatory Visit | Attending: Radiation Oncology | Admitting: Radiation Oncology

## 2019-02-01 ENCOUNTER — Other Ambulatory Visit: Payer: Self-pay

## 2019-02-01 ENCOUNTER — Telehealth: Payer: Self-pay

## 2019-02-01 DIAGNOSIS — C50411 Malignant neoplasm of upper-outer quadrant of right female breast: Secondary | ICD-10-CM | POA: Diagnosis not present

## 2019-02-01 DIAGNOSIS — Z510 Encounter for antineoplastic radiation therapy: Secondary | ICD-10-CM | POA: Diagnosis not present

## 2019-02-01 DIAGNOSIS — Z170 Estrogen receptor positive status [ER+]: Secondary | ICD-10-CM | POA: Diagnosis not present

## 2019-02-01 NOTE — Telephone Encounter (Signed)
Telephone call to patient for follow up after receiving first infusion yesterday.   Patient states she did great with infusion and feeling great today.  States eating well and drinking plenty of fluids.   Encouraged patient to call for any questions or concerns.

## 2019-02-02 ENCOUNTER — Ambulatory Visit
Admission: RE | Admit: 2019-02-02 | Discharge: 2019-02-02 | Disposition: A | Discharge status: Home / Self Care | Payer: PPO | Source: Ambulatory Visit | Attending: Radiation Oncology | Admitting: Radiation Oncology

## 2019-02-02 ENCOUNTER — Other Ambulatory Visit: Payer: Self-pay

## 2019-02-02 DIAGNOSIS — Z170 Estrogen receptor positive status [ER+]: Secondary | ICD-10-CM | POA: Diagnosis not present

## 2019-02-02 DIAGNOSIS — C50411 Malignant neoplasm of upper-outer quadrant of right female breast: Secondary | ICD-10-CM | POA: Diagnosis not present

## 2019-02-02 DIAGNOSIS — Z510 Encounter for antineoplastic radiation therapy: Secondary | ICD-10-CM | POA: Diagnosis not present

## 2019-02-03 ENCOUNTER — Other Ambulatory Visit: Payer: Self-pay

## 2019-02-03 ENCOUNTER — Ambulatory Visit
Admission: RE | Admit: 2019-02-03 | Discharge: 2019-02-03 | Disposition: A | Discharge status: Home / Self Care | Payer: PPO | Source: Ambulatory Visit | Attending: Radiation Oncology | Admitting: Radiation Oncology

## 2019-02-03 DIAGNOSIS — Z510 Encounter for antineoplastic radiation therapy: Secondary | ICD-10-CM | POA: Diagnosis not present

## 2019-02-03 DIAGNOSIS — Z170 Estrogen receptor positive status [ER+]: Secondary | ICD-10-CM | POA: Diagnosis not present

## 2019-02-03 DIAGNOSIS — C50411 Malignant neoplasm of upper-outer quadrant of right female breast: Secondary | ICD-10-CM | POA: Diagnosis not present

## 2019-02-06 ENCOUNTER — Ambulatory Visit
Admission: RE | Admit: 2019-02-06 | Discharge: 2019-02-06 | Disposition: A | Discharge status: Home / Self Care | Payer: PPO | Source: Ambulatory Visit | Attending: Radiation Oncology | Admitting: Radiation Oncology

## 2019-02-06 ENCOUNTER — Other Ambulatory Visit: Payer: Self-pay

## 2019-02-06 DIAGNOSIS — Z510 Encounter for antineoplastic radiation therapy: Secondary | ICD-10-CM | POA: Diagnosis not present

## 2019-02-06 DIAGNOSIS — Z170 Estrogen receptor positive status [ER+]: Secondary | ICD-10-CM | POA: Diagnosis not present

## 2019-02-06 DIAGNOSIS — C50411 Malignant neoplasm of upper-outer quadrant of right female breast: Secondary | ICD-10-CM | POA: Diagnosis not present

## 2019-02-07 ENCOUNTER — Ambulatory Visit
Admission: RE | Admit: 2019-02-07 | Discharge: 2019-02-07 | Disposition: A | Discharge status: Home / Self Care | Payer: PPO | Source: Ambulatory Visit | Attending: Radiation Oncology | Admitting: Radiation Oncology

## 2019-02-07 ENCOUNTER — Other Ambulatory Visit: Payer: Self-pay

## 2019-02-07 DIAGNOSIS — Z510 Encounter for antineoplastic radiation therapy: Secondary | ICD-10-CM | POA: Diagnosis not present

## 2019-02-07 DIAGNOSIS — C50411 Malignant neoplasm of upper-outer quadrant of right female breast: Secondary | ICD-10-CM | POA: Diagnosis not present

## 2019-02-07 DIAGNOSIS — Z170 Estrogen receptor positive status [ER+]: Secondary | ICD-10-CM | POA: Diagnosis not present

## 2019-02-08 ENCOUNTER — Inpatient Hospital Stay: Payer: PPO

## 2019-02-08 ENCOUNTER — Other Ambulatory Visit: Payer: Self-pay

## 2019-02-08 ENCOUNTER — Ambulatory Visit
Admission: RE | Admit: 2019-02-08 | Discharge: 2019-02-08 | Disposition: A | Discharge status: Home / Self Care | Payer: PPO | Source: Ambulatory Visit | Attending: Radiation Oncology | Admitting: Radiation Oncology

## 2019-02-08 DIAGNOSIS — Z510 Encounter for antineoplastic radiation therapy: Secondary | ICD-10-CM | POA: Diagnosis not present

## 2019-02-08 DIAGNOSIS — Z5112 Encounter for antineoplastic immunotherapy: Secondary | ICD-10-CM | POA: Diagnosis not present

## 2019-02-08 DIAGNOSIS — C50911 Malignant neoplasm of unspecified site of right female breast: Secondary | ICD-10-CM

## 2019-02-08 DIAGNOSIS — Z170 Estrogen receptor positive status [ER+]: Secondary | ICD-10-CM | POA: Diagnosis not present

## 2019-02-08 DIAGNOSIS — C50411 Malignant neoplasm of upper-outer quadrant of right female breast: Secondary | ICD-10-CM | POA: Diagnosis not present

## 2019-02-08 LAB — CBC
HCT: 39.7 % (ref 36.0–46.0)
Hemoglobin: 12.6 g/dL (ref 12.0–15.0)
MCH: 29.4 pg (ref 26.0–34.0)
MCHC: 31.7 g/dL (ref 30.0–36.0)
MCV: 92.5 fL (ref 80.0–100.0)
Platelets: 217 10*3/uL (ref 150–400)
RBC: 4.29 MIL/uL (ref 3.87–5.11)
RDW: 13.9 % (ref 11.5–15.5)
WBC: 6.3 10*3/uL (ref 4.0–10.5)
nRBC: 0 % (ref 0.0–0.2)

## 2019-02-09 ENCOUNTER — Other Ambulatory Visit: Payer: Self-pay

## 2019-02-09 ENCOUNTER — Ambulatory Visit
Admission: RE | Admit: 2019-02-09 | Discharge: 2019-02-09 | Disposition: A | Discharge status: Home / Self Care | Payer: PPO | Source: Ambulatory Visit | Attending: Radiation Oncology | Admitting: Radiation Oncology

## 2019-02-09 DIAGNOSIS — C50411 Malignant neoplasm of upper-outer quadrant of right female breast: Secondary | ICD-10-CM | POA: Diagnosis not present

## 2019-02-09 DIAGNOSIS — Z170 Estrogen receptor positive status [ER+]: Secondary | ICD-10-CM | POA: Diagnosis not present

## 2019-02-09 DIAGNOSIS — Z510 Encounter for antineoplastic radiation therapy: Secondary | ICD-10-CM | POA: Diagnosis not present

## 2019-02-10 ENCOUNTER — Ambulatory Visit
Admission: RE | Admit: 2019-02-10 | Discharge: 2019-02-10 | Disposition: A | Discharge status: Home / Self Care | Payer: PPO | Source: Ambulatory Visit | Attending: Radiation Oncology | Admitting: Radiation Oncology

## 2019-02-10 ENCOUNTER — Other Ambulatory Visit: Payer: Self-pay

## 2019-02-10 DIAGNOSIS — Z170 Estrogen receptor positive status [ER+]: Secondary | ICD-10-CM | POA: Diagnosis not present

## 2019-02-10 DIAGNOSIS — C50411 Malignant neoplasm of upper-outer quadrant of right female breast: Secondary | ICD-10-CM | POA: Diagnosis not present

## 2019-02-10 DIAGNOSIS — Z510 Encounter for antineoplastic radiation therapy: Secondary | ICD-10-CM | POA: Diagnosis not present

## 2019-02-13 ENCOUNTER — Ambulatory Visit
Admission: RE | Admit: 2019-02-13 | Discharge: 2019-02-13 | Disposition: A | Discharge status: Home / Self Care | Payer: PPO | Source: Ambulatory Visit | Attending: Radiation Oncology | Admitting: Radiation Oncology

## 2019-02-13 ENCOUNTER — Other Ambulatory Visit: Payer: Self-pay

## 2019-02-13 DIAGNOSIS — Z510 Encounter for antineoplastic radiation therapy: Secondary | ICD-10-CM | POA: Diagnosis not present

## 2019-02-14 ENCOUNTER — Ambulatory Visit
Admission: RE | Admit: 2019-02-14 | Discharge: 2019-02-14 | Disposition: A | Discharge status: Home / Self Care | Payer: PPO | Source: Ambulatory Visit | Attending: Radiation Oncology | Admitting: Radiation Oncology

## 2019-02-14 ENCOUNTER — Other Ambulatory Visit: Payer: Self-pay

## 2019-02-14 DIAGNOSIS — Z510 Encounter for antineoplastic radiation therapy: Secondary | ICD-10-CM | POA: Diagnosis not present

## 2019-02-15 ENCOUNTER — Other Ambulatory Visit: Payer: Self-pay

## 2019-02-15 ENCOUNTER — Ambulatory Visit
Admission: RE | Admit: 2019-02-15 | Discharge: 2019-02-15 | Disposition: A | Discharge status: Home / Self Care | Payer: PPO | Source: Ambulatory Visit | Attending: Radiation Oncology | Admitting: Radiation Oncology

## 2019-02-15 DIAGNOSIS — Z510 Encounter for antineoplastic radiation therapy: Secondary | ICD-10-CM | POA: Diagnosis not present

## 2019-02-16 ENCOUNTER — Other Ambulatory Visit: Payer: Self-pay

## 2019-02-16 ENCOUNTER — Ambulatory Visit
Admission: RE | Admit: 2019-02-16 | Discharge: 2019-02-16 | Disposition: A | Discharge status: Home / Self Care | Payer: PPO | Source: Ambulatory Visit | Attending: Radiation Oncology | Admitting: Radiation Oncology

## 2019-02-16 DIAGNOSIS — Z510 Encounter for antineoplastic radiation therapy: Secondary | ICD-10-CM | POA: Diagnosis not present

## 2019-02-16 NOTE — Progress Notes (Signed)
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  INTERVAL HISTORY: Patient returns to clinic today for further evaluation and consideration of cycle 2 of maintenance Herceptin.  She tolerated her first treatment well without significant side effects.  She is also tolerating XRT only with some mild irritation on her skin.  She currently feels well and is asymptomatic.  She has no neurologic complaints.  She denies any recent fevers or illnesses.  She has a good appetite and denies weight loss.  She has no chest pain, shortness of breath, cough, or hemoptysis.  She denies any nausea, vomiting, constipation, or diarrhea.  She has no urinary complaints.  Patient offers no specific complaints today.  REVIEW OF SYSTEMS:   Review of Systems  Constitutional: Negative.  Negative for fever, malaise/fatigue and weight loss.  Respiratory: Negative.  Negative for cough, hemoptysis and shortness of breath.   Cardiovascular: Negative.  Negative for chest pain and leg swelling.  Gastrointestinal: Negative.  Negative for abdominal pain.  Genitourinary: Negative.  Negative for dysuria.  Musculoskeletal: Negative.  Negative for back pain.  Skin: Negative.  Negative for rash.  Neurological: Negative.  Negative for dizziness, focal weakness, weakness and headaches.  Psychiatric/Behavioral: Negative.  The patient is not nervous/anxious.     As per HPI. Otherwise, a complete review of systems is negative.  PAST MEDICAL  HISTORY: Past Medical History:  Diagnosis Date  . Asthma   . Cancer [facility]) 1999   Left breast pre cancerous- mastectomy with reconstruction  . Hypertension     PAST SURGICAL HISTORY: Past Surgical History:  Procedure Laterality Date  . AUGMENTATION MAMMAPLASTY Left 1999  . BREAST BIOPSY Right 11/30/2018   affirm stereo bx of calcs, x marker, path pending  . BREAST BIOPSY Right 11/30/2018   affirm bx of calcs, ribbon marker,path pending  . BREAST LUMPECTOMY WITH NEEDLE LOCALIZATION AND AXILLARY SENTINEL LYMPH NODE BX Right 12/16/2018   Procedure: BREAST LUMPECTOMY WITH NEEDLE LOCALIZATION AND AXILLARY SENTINEL LYMPH NODE BX;  Surgeon: Robert Bellow, MD;  Location: [facility] ORS;  Service: General;  Laterality: Right;  . KIDNEY CYST REMOVAL    . MASTECTOMY Left 1999   Implant  . REDUCTION MAMMAPLASTY Right 1997    FAMILY HISTORY: Family History  Problem Relation Age of Onset  . Breast cancer Neg Hx     ADVANCED DIRECTIVES (Y/N):  N  HEALTH MAINTENANCE: Social History   Tobacco Use  . Smoking status: Never Smoker  . Smokeless tobacco: Never Used  Substance Use Topics  . Alcohol use: Never  . Drug use: Never     Colonoscopy:  PAP:  Bone density:  Lipid panel:  Allergies  Allergen Reactions  . Cephalexin Other (See Comments)    Possible headache     Current Outpatient Medications  Medication Sig Dispense Refill  . ADVAIR DISKUS 250-50 MCG/DOSE AEPB Inhale 1 puff into the lungs every 12 (twelve) hours.    . Biotin (BIOTIN 5000) 5 MG CAPS Take 5 mg by mouth daily.    . Cholecalciferol 25 MCG (  1000 UT) tablet Take 1,000 Units by mouth daily.    Marland Kitchen guaiFENesin-dextromethorphan (ROBITUSSIN DM) 100-10 MG/5ML syrup Take 5 mLs by mouth every 4 (four) hours as needed for cough. 118 mL 0  . latanoprost (XALATAN) 0.005 % ophthalmic solution Apply 1 drop to eye at bedtime.    . Multiple Vitamins-Minerals (MULTIVITAL PLATINUM SILVER PO) Take 1 tablet by mouth daily.    .  Multiple Vitamins-Minerals (PRESERVISION AREDS) CAPS Take 1 capsule by mouth 2 (two) times daily.     . potassium chloride (K-DUR,KLOR-CON) 10 MEQ tablet Take 10 mEq by mouth 2 (two) times daily.    Marland Kitchen PROAIR HFA 108 (90 Base) MCG/ACT inhaler Inhale 2 puffs into the lungs every 4 (four) hours as needed for wheezing.    Marland Kitchen spironolactone (ALDACTONE) 25 MG tablet Take 25 mg by mouth daily.    . traMADol (ULTRAM) 50 MG tablet Take 1 tablet (50 mg total) by mouth every 6 (six) hours as needed. 12 tablet 0   No current facility-administered medications for this visit.   Facility-Administered Medications Ordered in Other Visits  Medication Dose Route Frequency Provider Last Rate Last Admin  . 0.9 %  sodium chloride infusion   Intravenous Once Lloyd Huger, MD      . acetaminophen (TYLENOL) tablet 650 mg  650 mg Oral Once Lloyd Huger, MD      . diphenhydrAMINE (BENADRYL) capsule 25 mg  25 mg Oral Once Lloyd Huger, MD      . heparin lock flush 100 unit/mL  500 Units Intracatheter Once PRN Lloyd Huger, MD      . trastuzumab-dkst (OGIVRI) 357 mg in sodium chloride 0.9 % 250 mL chemo infusion  6 mg/kg (Treatment Plan Recorded) Intravenous Once Lloyd Huger, MD        OBJECTIVE: Vitals:   02/21/19 0915  BP: (!) 149/61  Pulse: 78  Temp: (!) 96.8 F (36 C)     Body mass index is 22.25 kg/m.    ECOG FS:0 - Asymptomatic  General: Well-developed, well-nourished, no acute distress. Eyes: Pink conjunctiva, anicteric sclera. HEENT: Normocephalic, moist mucous membranes. Lungs: No audible wheezing or coughing. Heart: Regular rate and rhythm. Abdomen: Soft, nontender, no obvious distention. Musculoskeletal: No edema, cyanosis, or clubbing. Neuro: Alert, answering all questions appropriately. Cranial nerves grossly intact. Skin: No rashes or petechiae noted. Psych: Normal affect.  LAB RESULTS:  Lab Results  Component Value Date   NA 143 02/21/2019   K 4.1  02/21/2019   CL 105 02/21/2019   CO2 30 02/21/2019   GLUCOSE 82 02/21/2019   BUN 26 (H) 02/21/2019   CREATININE 0.83 02/21/2019   CALCIUM 9.5 02/21/2019   PROT 6.5 02/21/2019   ALBUMIN 4.2 02/21/2019   AST 18 02/21/2019   ALT 16 02/21/2019   ALKPHOS 58 02/21/2019   BILITOT 0.7 02/21/2019   GFRNONAA >60 02/21/2019   GFRAA >60 02/21/2019    Lab Results  Component Value Date   WBC 5.6 02/21/2019   NEUTROABS 4.2 02/21/2019   HGB 12.9 02/21/2019   HCT 40.7 02/21/2019   MCV 92.9 02/21/2019   PLT 219 02/21/2019     STUDIES: NM Cardiac Muga Rest  Result Date: 01/24/2019 CLINICAL DATA:  Breast cancer. Evaluate cardiac function in relation to chemotherapy. EXAM: NUCLEAR MEDICINE CARDIAC BLOOD POOL IMAGING (MUGA) TECHNIQUE: Cardiac multi-gated acquisition was performed at rest following intravenous injection of Tc-40mlabeled red blood cells. RADIOPHARMACEUTICALS:  20.8 mCi Tc-915mertechnetate in-vitro labeled red blood  cells IV COMPARISON:  None. FINDINGS: No  focal wall motion abnormality of the left ventricle. Calculated left ventricular ejection fraction equals 62.5 % IMPRESSION: Left ventricular ejection fraction equals62.5 %. Electronically Signed   By: Suzy Bouchard M.D.   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  PLAN:    1. 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  Although guidelines recommend reexcision given her close margins, the benefit of this is unclear given patient's advanced age and she ultimately chose not to undergo resection.  She has declined adjuvant treatment with chemotherapy, but would like to pursue single agent Herceptin.  She  expressed understanding that this may not be as effective and her chance of recurrence increases.  MUGA scan on January 24, 2019 revealed an EF of 64.5%.  Patient continues to decline port placement at this time.  Continue daily XRT completing treatment on February 23, 2019.  Patient was given a prescription for letrozole today which she will require to take for a minimum of 5 years,  but given her high risk disease, will consider extended treatment.  Proceed with cycle 2 of single agent Herceptin today.  Patient continues to decline premedications, therefore will delete these from her treatment plan.  Return to clinic in 3 weeks for further evaluation and consideration of cycle 3.  2.  Bone health: Will get a baseline bone mineral density in the next 1 to 2 weeks.  I spent a total of 25 minutes face-to-face with the patient and reviewing chart data of which greater than 50% of the visit was spent in counseling and coordination of care as detailed above.  Patient expressed understanding and was in agreement with this plan. She also understands that She can call clinic at any time with any questions, concerns, or complaints.   Cancer Staging Primary cancer of upper outer quadrant of right female breast [facility]) Staging form: Breast, AJCC 8th Edition - Clinical stage from 12/12/2018: Stage IB (cT2, cN0, cM0, G3, ER+, PR+, HER2+) - Signed by Lloyd Huger, MD on 01/11/2019   Lloyd Huger, MD   02/21/2019 10:03 AM

## 2019-02-17 ENCOUNTER — Other Ambulatory Visit: Payer: Self-pay

## 2019-02-17 ENCOUNTER — Ambulatory Visit
Admission: RE | Admit: 2019-02-17 | Discharge: 2019-02-17 | Disposition: A | Discharge status: Home / Self Care | Payer: PPO | Source: Ambulatory Visit | Attending: Radiation Oncology | Admitting: Radiation Oncology

## 2019-02-17 DIAGNOSIS — Z170 Estrogen receptor positive status [ER+]: Secondary | ICD-10-CM | POA: Diagnosis not present

## 2019-02-17 DIAGNOSIS — Z510 Encounter for antineoplastic radiation therapy: Secondary | ICD-10-CM | POA: Diagnosis not present

## 2019-02-17 DIAGNOSIS — C50411 Malignant neoplasm of upper-outer quadrant of right female breast: Secondary | ICD-10-CM | POA: Diagnosis not present

## 2019-02-20 ENCOUNTER — Other Ambulatory Visit: Payer: Self-pay

## 2019-02-20 ENCOUNTER — Ambulatory Visit
Admission: RE | Admit: 2019-02-20 | Discharge: 2019-02-20 | Disposition: A | Discharge status: Home / Self Care | Payer: PPO | Source: Ambulatory Visit | Attending: Radiation Oncology | Admitting: Radiation Oncology

## 2019-02-20 ENCOUNTER — Other Ambulatory Visit: Payer: Self-pay | Admitting: Emergency Medicine

## 2019-02-20 DIAGNOSIS — Z510 Encounter for antineoplastic radiation therapy: Secondary | ICD-10-CM | POA: Diagnosis not present

## 2019-02-20 DIAGNOSIS — C50911 Malignant neoplasm of unspecified site of right female breast: Secondary | ICD-10-CM

## 2019-02-20 NOTE — Progress Notes (Signed)
Patient pre screened for office appointment, no questions or concerns today. Patient reminded of upcoming appointment time and date. 

## 2019-02-21 ENCOUNTER — Inpatient Hospital Stay: Payer: PPO

## 2019-02-21 ENCOUNTER — Inpatient Hospital Stay (HOSPITAL_BASED_OUTPATIENT_CLINIC_OR_DEPARTMENT_OTHER): Payer: PPO | Admitting: Oncology

## 2019-02-21 ENCOUNTER — Other Ambulatory Visit: Payer: Self-pay

## 2019-02-21 ENCOUNTER — Ambulatory Visit
Admission: RE | Admit: 2019-02-21 | Discharge: 2019-02-21 | Disposition: A | Discharge status: Home / Self Care | Payer: PPO | Source: Ambulatory Visit | Attending: Radiation Oncology | Admitting: Radiation Oncology

## 2019-02-21 VITALS — BP 149/61 | HR 78 | Temp 96.8°F | Wt 129.6 lb

## 2019-02-21 DIAGNOSIS — Z510 Encounter for antineoplastic radiation therapy: Secondary | ICD-10-CM | POA: Diagnosis not present

## 2019-02-21 DIAGNOSIS — N183 Chronic kidney disease, stage 3 unspecified: Secondary | ICD-10-CM | POA: Diagnosis not present

## 2019-02-21 DIAGNOSIS — C50911 Malignant neoplasm of unspecified site of right female breast: Secondary | ICD-10-CM

## 2019-02-21 DIAGNOSIS — C50411 Malignant neoplasm of upper-outer quadrant of right female breast: Secondary | ICD-10-CM | POA: Diagnosis not present

## 2019-02-21 DIAGNOSIS — I129 Hypertensive chronic kidney disease with stage 1 through stage 4 chronic kidney disease, or unspecified chronic kidney disease: Secondary | ICD-10-CM | POA: Diagnosis not present

## 2019-02-21 DIAGNOSIS — E7800 Pure hypercholesterolemia, unspecified: Secondary | ICD-10-CM | POA: Diagnosis not present

## 2019-02-21 DIAGNOSIS — J4521 Mild intermittent asthma with (acute) exacerbation: Secondary | ICD-10-CM | POA: Diagnosis not present

## 2019-02-21 DIAGNOSIS — Z5112 Encounter for antineoplastic immunotherapy: Secondary | ICD-10-CM | POA: Diagnosis not present

## 2019-02-21 LAB — COMPREHENSIVE METABOLIC PANEL
ALT: 16 U/L (ref 0–44)
AST: 18 U/L (ref 15–41)
Albumin: 4.2 g/dL (ref 3.5–5.0)
Alkaline Phosphatase: 58 U/L (ref 38–126)
Anion gap: 8 (ref 5–15)
BUN: 26 mg/dL — ABNORMAL HIGH (ref 8–23)
CO2: 30 mmol/L (ref 22–32)
Calcium: 9.5 mg/dL (ref 8.9–10.3)
Chloride: 105 mmol/L (ref 98–111)
Creatinine, Ser: 0.83 mg/dL (ref 0.44–1.00)
GFR calc Af Amer: >60 mL/min (ref >60)
GFR calc non Af Amer: >60 mL/min (ref >60)
Glucose, Bld: 82 mg/dL (ref 70–99)
Potassium: 4.1 mmol/L (ref 3.5–5.1)
Sodium: 143 mmol/L (ref 135–145)
Total Bilirubin: 0.7 mg/dL (ref 0.3–1.2)
Total Protein: 6.5 g/dL (ref 6.5–8.1)

## 2019-02-21 LAB — CBC WITH DIFFERENTIAL/PLATELET
Abs Immature Granulocytes: 0.02 10*3/uL (ref 0.00–0.07)
Basophils Absolute: 0.1 10*3/uL (ref 0.0–0.1)
Basophils Relative: 1 %
Eosinophils Absolute: 0.2 10*3/uL (ref 0.0–0.5)
Eosinophils Relative: 4 %
HCT: 40.7 % (ref 36.0–46.0)
Hemoglobin: 12.9 g/dL (ref 12.0–15.0)
Immature Granulocytes: 0 %
Lymphocytes Relative: 11 %
Lymphs Abs: 0.6 10*3/uL — ABNORMAL LOW (ref 0.7–4.0)
MCH: 29.5 pg (ref 26.0–34.0)
MCHC: 31.7 g/dL (ref 30.0–36.0)
MCV: 92.9 fL (ref 80.0–100.0)
Monocytes Absolute: 0.5 10*3/uL (ref 0.1–1.0)
Monocytes Relative: 8 %
Neutro Abs: 4.2 10*3/uL (ref 1.7–7.7)
Neutrophils Relative %: 76 %
Platelets: 219 10*3/uL (ref 150–400)
RBC: 4.38 MIL/uL (ref 3.87–5.11)
RDW: 13.8 % (ref 11.5–15.5)
WBC: 5.6 10*3/uL (ref 4.0–10.5)
nRBC: 0 % (ref 0.0–0.2)

## 2019-02-21 MED ORDER — SODIUM CHLORIDE 0.9 % IV SOLN
Freq: Once | INTRAVENOUS | Status: AC
  Filled 2019-02-21: qty 250

## 2019-02-21 MED ORDER — TRASTUZUMAB-DKST CHEMO 150 MG IV SOLR
6.0000 mg/kg | Freq: Once | INTRAVENOUS | Status: AC
  Administered 2019-02-21: 11:00:00 357 mg via INTRAVENOUS
  Filled 2019-02-21: qty 17

## 2019-02-21 MED ORDER — ACETAMINOPHEN 325 MG PO TABS: 650.0000 mg | ORAL_TABLET | Freq: Once | ORAL | Status: DC

## 2019-02-21 MED ORDER — HEPARIN SOD (PORK) LOCK FLUSH 100 UNIT/ML IV SOLN
500.0000 [IU] | Freq: Once | INTRAVENOUS | Status: DC | PRN
  Filled 2019-02-21: qty 5

## 2019-02-21 MED ORDER — DIPHENHYDRAMINE HCL 25 MG PO CAPS: 25.0000 mg | ORAL_CAPSULE | Freq: Once | ORAL | Status: DC

## 2019-02-21 NOTE — Progress Notes (Signed)
[id number]: pt refused pre-medications (Tylenol and Benadryl) Dr. Grayland Ormond aware. Per Dr. Grayland Ormond okay to proceed with treatment.  1120: Pt denies any concerns or complaints at this time. No s/s of distress noted. Pt stable at time of discharge.

## 2019-02-22 ENCOUNTER — Other Ambulatory Visit: Payer: Self-pay

## 2019-02-22 ENCOUNTER — Ambulatory Visit
Admission: RE | Admit: 2019-02-22 | Discharge: 2019-02-22 | Disposition: A | Discharge status: Home / Self Care | Payer: PPO | Source: Ambulatory Visit | Attending: Radiation Oncology | Admitting: Radiation Oncology

## 2019-02-22 DIAGNOSIS — Z510 Encounter for antineoplastic radiation therapy: Secondary | ICD-10-CM | POA: Diagnosis not present

## 2019-02-23 ENCOUNTER — Other Ambulatory Visit: Payer: Self-pay

## 2019-02-23 ENCOUNTER — Ambulatory Visit
Admission: RE | Admit: 2019-02-23 | Discharge: 2019-02-23 | Disposition: A | Discharge status: Home / Self Care | Payer: PPO | Source: Ambulatory Visit | Attending: Radiation Oncology | Admitting: Radiation Oncology

## 2019-02-23 DIAGNOSIS — C50411 Malignant neoplasm of upper-outer quadrant of right female breast: Secondary | ICD-10-CM | POA: Diagnosis not present

## 2019-02-23 DIAGNOSIS — Z170 Estrogen receptor positive status [ER+]: Secondary | ICD-10-CM | POA: Diagnosis not present

## 2019-02-23 DIAGNOSIS — Z510 Encounter for antineoplastic radiation therapy: Secondary | ICD-10-CM | POA: Diagnosis not present

## 2019-03-02 DIAGNOSIS — J4521 Mild intermittent asthma with (acute) exacerbation: Secondary | ICD-10-CM | POA: Diagnosis not present

## 2019-03-02 DIAGNOSIS — N1831 Chronic kidney disease, stage 3a: Secondary | ICD-10-CM | POA: Diagnosis not present

## 2019-03-02 DIAGNOSIS — I129 Hypertensive chronic kidney disease with stage 1 through stage 4 chronic kidney disease, or unspecified chronic kidney disease: Secondary | ICD-10-CM | POA: Diagnosis not present

## 2019-03-02 DIAGNOSIS — Z0000 Encounter for general adult medical examination without abnormal findings: Secondary | ICD-10-CM | POA: Diagnosis not present

## 2019-03-02 DIAGNOSIS — E7800 Pure hypercholesterolemia, unspecified: Secondary | ICD-10-CM | POA: Diagnosis not present

## 2019-03-02 DIAGNOSIS — E876 Hypokalemia: Secondary | ICD-10-CM | POA: Diagnosis not present

## 2019-03-02 DIAGNOSIS — K219 Gastro-esophageal reflux disease without esophagitis: Secondary | ICD-10-CM | POA: Diagnosis not present

## 2019-03-11 NOTE — Progress Notes (Signed)
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  INTERVAL HISTORY: Patient returns to clinic today for further evaluation and consideration of cycle 3 of maintenance Herceptin.  She has now completed XRT.  She is tolerating her treatments well without significant side effects.  She currently feels well and is asymptomatic.  She has no neurologic complaints.  She denies any recent fevers or illnesses.  She has a good appetite and denies weight loss.  She has no chest pain, shortness of breath, cough, or hemoptysis.  She denies any nausea, vomiting, constipation, or diarrhea.  She has no urinary complaints.  Patient offers no specific complaints today.  REVIEW OF SYSTEMS:   Review of Systems  Constitutional: Negative.  Negative for fever, malaise/fatigue and weight loss.  Respiratory: Negative.  Negative for cough, hemoptysis and shortness of breath.   Cardiovascular: Negative.  Negative for chest pain and leg swelling.  Gastrointestinal: Negative.  Negative for abdominal pain.  Genitourinary: Negative.  Negative for dysuria.  Musculoskeletal: Negative.  Negative for back pain.  Skin: Negative.  Negative for rash.  Neurological: Negative.  Negative for dizziness, focal weakness, weakness and headaches.  Psychiatric/Behavioral: Negative.  The patient is not nervous/anxious.     As per HPI. Otherwise, a complete review of systems is negative.  PAST MEDICAL HISTORY: Past Medical History:  Diagnosis Date  .  Asthma   . Cancer [facility]) 1999   Left breast pre cancerous- mastectomy with reconstruction  . Hypertension     PAST SURGICAL HISTORY: Past Surgical History:  Procedure Laterality Date  . AUGMENTATION MAMMAPLASTY Left 1999  . BREAST BIOPSY Right 11/30/2018   affirm stereo bx of calcs, x marker, path pending  . BREAST BIOPSY Right 11/30/2018   affirm bx of calcs, ribbon marker,path pending  . BREAST LUMPECTOMY WITH NEEDLE LOCALIZATION AND AXILLARY SENTINEL LYMPH NODE BX Right 12/16/2018   Procedure: BREAST LUMPECTOMY WITH NEEDLE LOCALIZATION AND AXILLARY SENTINEL LYMPH NODE BX;  Surgeon: Robert Bellow, MD;  Location: [facility] ORS;  Service: General;  Laterality: Right;  . KIDNEY CYST REMOVAL    . MASTECTOMY Left 1999   Implant  . REDUCTION MAMMAPLASTY Right 1997    FAMILY HISTORY: Family History  Problem Relation Age of Onset  . Breast cancer Neg Hx     ADVANCED DIRECTIVES (Y/N):  N  HEALTH MAINTENANCE: Social History   Tobacco Use  . Smoking status: Never Smoker  . Smokeless tobacco: Never Used  Substance Use Topics  . Alcohol use: Never  . Drug use: Never     Colonoscopy:  PAP:  Bone density:  Lipid panel:  Allergies  Allergen Reactions  . Cephalexin Other (See Comments)    Possible headache     Current Outpatient Medications  Medication Sig Dispense Refill  . ADVAIR DISKUS 250-50 MCG/DOSE AEPB Inhale 1 puff into the lungs every 12 (twelve) hours.    . Biotin (BIOTIN 5000) 5 MG CAPS Take 5 mg by mouth daily.    . Cholecalciferol 25 MCG (1000 UT) tablet Take 1,000 Units by mouth  daily.    . guaiFENesin-dextromethorphan (ROBITUSSIN DM) 100-10 MG/5ML syrup Take 5 mLs by mouth every 4 (four) hours as needed for cough. 118 mL 0  . latanoprost (XALATAN) 0.005 % ophthalmic solution Apply 1 drop to eye at bedtime.    . Multiple Vitamins-Minerals (MULTIVITAL PLATINUM SILVER PO) Take 1 tablet by mouth daily.    . Multiple Vitamins-Minerals (PRESERVISION AREDS) CAPS  Take 1 capsule by mouth 2 (two) times daily.     . potassium chloride (K-DUR,KLOR-CON) 10 MEQ tablet Take 10 mEq by mouth 2 (two) times daily.    Marland Kitchen PROAIR HFA 108 (90 Base) MCG/ACT inhaler Inhale 2 puffs into the lungs every 4 (four) hours as needed for wheezing.    Marland Kitchen spironolactone (ALDACTONE) 25 MG tablet Take 25 mg by mouth daily.    . traMADol (ULTRAM) 50 MG tablet Take 1 tablet (50 mg total) by mouth every 6 (six) hours as needed. 12 tablet 0  . letrozole (FEMARA) 2.5 MG tablet Take 1 tablet (2.5 mg total) by mouth daily. 90 tablet 3   No current facility-administered medications for this visit.    OBJECTIVE: Vitals:   03/14/19 0917  BP: (!) 173/88  Pulse: 75  Resp: 18     Body mass index is 22.09 kg/m.    ECOG FS:0 - Asymptomatic  General: Well-developed, well-nourished, no acute distress. Eyes: Pink conjunctiva, anicteric sclera. HEENT: Normocephalic, moist mucous membranes. Lungs: No audible wheezing or coughing. Heart: Regular rate and rhythm. Abdomen: Soft, nontender, no obvious distention. Musculoskeletal: No edema, cyanosis, or clubbing. Neuro: Alert, answering all questions appropriately. Cranial nerves grossly intact. Skin: No rashes or petechiae noted. Psych: Normal affect.  LAB RESULTS:  Lab Results  Component Value Date   NA 139 03/14/2019   K 3.8 03/14/2019   CL 103 03/14/2019   CO2 28 03/14/2019   GLUCOSE 99 03/14/2019   BUN 21 03/14/2019   CREATININE 0.70 03/14/2019   CALCIUM 9.4 03/14/2019   PROT 6.4 (L) 03/14/2019   ALBUMIN 4.1 03/14/2019   AST 18 03/14/2019   ALT 20 03/14/2019   ALKPHOS 62 03/14/2019   BILITOT 0.8 03/14/2019   GFRNONAA >60 03/14/2019   GFRAA >60 03/14/2019    Lab Results  Component Value Date   WBC 7.5 03/14/2019   NEUTROABS 5.8 03/14/2019   HGB 13.1 03/14/2019   HCT 40.9 03/14/2019   MCV 91.7 03/14/2019   PLT 231 03/14/2019     STUDIES: No results found.  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  PLAN:    1. 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  Although guidelines recommend reexcision given her close margins, the benefit of this is unclear given patient's advanced age and she ultimately chose not to undergo resection.  She has declined adjuvant treatment with chemotherapy, but would like to pursue single agent Herceptin.  She expressed understanding that this may not be as effective and her chance of recurrence increases.  MUGA scan on January 24, 2019 revealed an EF of 64.5%.  Patient continues to decline port placement at this time.  Patient has now completed XRT and was given a prescription for letrozole which she will take daily for 5 years completing treatment in January 2026,  but given her high risk disease, will consider extended treatment.  Proceed with cycle 3 of single agent Herceptin today.  Patient continues to decline premedications, therefore these were deleted from her treatment plan.  Return to clinic in 3 weeks for further evaluation and consideration of cycle 4.   2.  Bone health: Patient will require a baseline bone mineral density in the next 1 to 2 weeks.  I spent a total of 25 minutes face-to-face with the patient and reviewing chart data of which greater than 50% of the visit was spent in counseling and coordination of care as detailed above.   Patient expressed understanding and was in agreement with this plan. She also understands that She can call clinic at any time with any questions, concerns, or complaints.   Cancer Staging Primary cancer of upper outer quadrant of right female breast [facility]) Staging form: Breast, AJCC 8th Edition - Clinical stage from 12/12/2018: Stage  IB (cT2, cN0, cM0, G3, ER+, PR+, HER2+) - Signed by Lloyd Huger, MD on 01/11/2019   Lloyd Huger, MD   03/14/2019 2:44 PM

## 2019-03-13 ENCOUNTER — Other Ambulatory Visit: Payer: Self-pay

## 2019-03-13 NOTE — Progress Notes (Signed)
Patient pre screened for office appointment, no questions or concerns today. Patient reminded of upcoming appointment time and date. 

## 2019-03-14 ENCOUNTER — Inpatient Hospital Stay: Payer: PPO

## 2019-03-14 ENCOUNTER — Other Ambulatory Visit: Payer: Self-pay

## 2019-03-14 ENCOUNTER — Inpatient Hospital Stay: Payer: PPO | Attending: Oncology

## 2019-03-14 ENCOUNTER — Other Ambulatory Visit: Payer: Self-pay | Admitting: Emergency Medicine

## 2019-03-14 ENCOUNTER — Inpatient Hospital Stay (HOSPITAL_BASED_OUTPATIENT_CLINIC_OR_DEPARTMENT_OTHER): Payer: PPO | Admitting: Oncology

## 2019-03-14 VITALS — BP 173/88 | HR 75 | Resp 18 | Wt 128.7 lb

## 2019-03-14 DIAGNOSIS — C50911 Malignant neoplasm of unspecified site of right female breast: Secondary | ICD-10-CM

## 2019-03-14 DIAGNOSIS — Z170 Estrogen receptor positive status [ER+]: Secondary | ICD-10-CM | POA: Insufficient documentation

## 2019-03-14 DIAGNOSIS — C50411 Malignant neoplasm of upper-outer quadrant of right female breast: Secondary | ICD-10-CM

## 2019-03-14 DIAGNOSIS — Z5112 Encounter for antineoplastic immunotherapy: Secondary | ICD-10-CM | POA: Diagnosis not present

## 2019-03-14 LAB — CBC WITH DIFFERENTIAL/PLATELET
Abs Immature Granulocytes: 0.01 10*3/uL (ref 0.00–0.07)
Basophils Absolute: 0 10*3/uL (ref 0.0–0.1)
Basophils Relative: 0 %
Eosinophils Absolute: 0.2 10*3/uL (ref 0.0–0.5)
Eosinophils Relative: 3 %
HCT: 40.9 % (ref 36.0–46.0)
Hemoglobin: 13.1 g/dL (ref 12.0–15.0)
Immature Granulocytes: 0 %
Lymphocytes Relative: 11 %
Lymphs Abs: 0.8 10*3/uL (ref 0.7–4.0)
MCH: 29.4 pg (ref 26.0–34.0)
MCHC: 32 g/dL (ref 30.0–36.0)
MCV: 91.7 fL (ref 80.0–100.0)
Monocytes Absolute: 0.7 10*3/uL (ref 0.1–1.0)
Monocytes Relative: 9 %
Neutro Abs: 5.8 10*3/uL (ref 1.7–7.7)
Neutrophils Relative %: 77 %
Platelets: 231 10*3/uL (ref 150–400)
RBC: 4.46 MIL/uL (ref 3.87–5.11)
RDW: 13.3 % (ref 11.5–15.5)
WBC: 7.5 10*3/uL (ref 4.0–10.5)
nRBC: 0 % (ref 0.0–0.2)

## 2019-03-14 LAB — COMPREHENSIVE METABOLIC PANEL
ALT: 20 U/L (ref 0–44)
AST: 18 U/L (ref 15–41)
Albumin: 4.1 g/dL (ref 3.5–5.0)
Alkaline Phosphatase: 62 U/L (ref 38–126)
Anion gap: 8 (ref 5–15)
BUN: 21 mg/dL (ref 8–23)
CO2: 28 mmol/L (ref 22–32)
Calcium: 9.4 mg/dL (ref 8.9–10.3)
Chloride: 103 mmol/L (ref 98–111)
Creatinine, Ser: 0.7 mg/dL (ref 0.44–1.00)
GFR calc Af Amer: >60 mL/min (ref >60)
GFR calc non Af Amer: >60 mL/min (ref >60)
Glucose, Bld: 99 mg/dL (ref 70–99)
Potassium: 3.8 mmol/L (ref 3.5–5.1)
Sodium: 139 mmol/L (ref 135–145)
Total Bilirubin: 0.8 mg/dL (ref 0.3–1.2)
Total Protein: 6.4 g/dL — ABNORMAL LOW (ref 6.5–8.1)

## 2019-03-14 MED ORDER — SODIUM CHLORIDE 0.9 % IV SOLN
Freq: Once | INTRAVENOUS | Status: AC
  Filled 2019-03-14: qty 250

## 2019-03-14 MED ORDER — TRASTUZUMAB-DKST CHEMO 150 MG IV SOLR
6.0000 mg/kg | Freq: Once | INTRAVENOUS | Status: AC
  Administered 2019-03-14: 11:00:00 357 mg via INTRAVENOUS
  Filled 2019-03-14: qty 17

## 2019-03-14 MED ORDER — LETROZOLE 2.5 MG PO TABS: 2.5000 mg | ORAL_TABLET | Freq: Every day | ORAL | 3 refills | Status: DC

## 2019-03-21 ENCOUNTER — Ambulatory Visit
Admission: RE | Admit: 2019-03-21 | Discharge: 2019-03-21 | Disposition: A | Discharge status: Home / Self Care | Payer: PPO | Source: Ambulatory Visit | Attending: Oncology | Admitting: Oncology

## 2019-03-21 DIAGNOSIS — Z923 Personal history of irradiation: Secondary | ICD-10-CM | POA: Diagnosis not present

## 2019-03-21 DIAGNOSIS — Z9221 Personal history of antineoplastic chemotherapy: Secondary | ICD-10-CM | POA: Insufficient documentation

## 2019-03-21 DIAGNOSIS — Z13820 Encounter for screening for osteoporosis: Secondary | ICD-10-CM | POA: Diagnosis not present

## 2019-03-21 DIAGNOSIS — Z780 Asymptomatic menopausal state: Secondary | ICD-10-CM | POA: Diagnosis not present

## 2019-03-21 DIAGNOSIS — M810 Age-related osteoporosis without current pathological fracture: Secondary | ICD-10-CM | POA: Diagnosis not present

## 2019-03-21 DIAGNOSIS — C50411 Malignant neoplasm of upper-outer quadrant of right female breast: Secondary | ICD-10-CM | POA: Insufficient documentation

## 2019-03-23 ENCOUNTER — Encounter: Payer: Self-pay | Admitting: Radiation Oncology

## 2019-03-23 ENCOUNTER — Other Ambulatory Visit: Payer: Self-pay

## 2019-03-23 NOTE — Progress Notes (Signed)
Patient pre screened for office appointment, no questions or concerns today. Patient reminded of upcoming appointment time and date. 

## 2019-03-24 ENCOUNTER — Ambulatory Visit
Admission: RE | Admit: 2019-03-24 | Discharge: 2019-03-24 | Disposition: A | Discharge status: Home / Self Care | Payer: PPO | Source: Ambulatory Visit | Attending: Radiation Oncology | Admitting: Radiation Oncology

## 2019-03-24 ENCOUNTER — Other Ambulatory Visit: Payer: Self-pay

## 2019-03-24 VITALS — BP 175/85 | HR 79 | Temp 97.9°F | Resp 16 | Wt 129.5 lb

## 2019-03-24 DIAGNOSIS — Z923 Personal history of irradiation: Secondary | ICD-10-CM | POA: Insufficient documentation

## 2019-03-24 DIAGNOSIS — C50411 Malignant neoplasm of upper-outer quadrant of right female breast: Secondary | ICD-10-CM

## 2019-03-24 DIAGNOSIS — Z170 Estrogen receptor positive status [ER+]: Secondary | ICD-10-CM | POA: Insufficient documentation

## 2019-03-24 DIAGNOSIS — Z79811 Long term (current) use of aromatase inhibitors: Secondary | ICD-10-CM | POA: Insufficient documentation

## 2019-03-24 NOTE — Progress Notes (Signed)
Radiation Oncology Follow up Note  Name: Hayley Nolan   Date:   03/24/2019 MRN:  [id number] DOB: Aug 07, 1927    This 86 y.o. female presents to the clinic today for 1 month follow-up status post whole breast radiation to her right breast status post wide local excision for stage Ia ER/PR positive invasive mammary carcinoma  REFERRING PROVIDER: Kirk Ruths, MD  HPI: Patient is a an 86 year old female now seen out 1 month having completed whole breast radiation to her right breast for stage Ia ER/PR positive invasive mammary carcinoma.  Her ductal carcinoma margin was close or involved and we boost to that area.  She is seen today in routine follow-up and is doing well.  She specifically denies breast tenderness cough or bone pain..  She has been started on letrozole tolerating that well without side effect.  COMPLICATIONS OF TREATMENT: none  FOLLOW UP COMPLIANCE: keeps appointments   PHYSICAL EXAM:  BP (!) 175/85   Pulse 79   Temp 97.9 F (36.6 C)   Resp 16   Wt 129 lb 8 oz (58.7 kg)   SpO2 96%   BMI 22.23 kg/m  Left breast is distorted from prior implant cosmetic result is fair.  Right breast has no dominant mass or nodularity.  Some retraction towards the nipple cosmetic result is good to fair.  No axillary or supraclavicular adenopathy is identified.  No dominant masses noted in the left breast either.  Well-developed well-nourished patient in NAD. HEENT reveals PERLA, EOMI, discs not visualized.  Oral cavity is clear. No oral mucosal lesions are identified. Neck is clear without evidence of cervical or supraclavicular adenopathy. Lungs are clear to A&P. Cardiac examination is essentially unremarkable with regular rate and rhythm without murmur rub or thrill. Abdomen is benign with no organomegaly or masses noted. Motor sensory and DTR levels are equal and symmetric in the upper and lower extremities. Cranial nerves II through XII are grossly intact. Proprioception is intact.  No peripheral adenopathy or edema is identified. No motor or sensory levels are noted. Crude visual fields are within normal range.  RADIOLOGY RESULTS: No current films to review  PLAN: Present time patient is doing well 1 month out from whole breast radiation to her right breast.  I am pleased with her overall progress.  I have asked to see her back in 4 to 5 months for follow-up.  She continues on letrozole without side effect.  Patient and family know to call with any concerns.  I would like to take this opportunity to thank you for allowing me to participate in the care of your patient.Noreene Filbert, MD

## 2019-03-31 NOTE — Progress Notes (Signed)
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  INTERVAL HISTORY: Patient returns to clinic 86 years old today for further evaluation and consideration of cycle 4 of maintenance Orgivri.  She continues to tolerate her treatments well without significant side effects.  She is tolerating letrozole only with some mild hair thinning. She currently feels well and is asymptomatic.  She has no neurologic complaints.  She denies any recent fevers or illnesses.  She has a good appetite and denies weight loss.  She has no chest pain, shortness of breath, cough, or hemoptysis.  She denies any nausea, vomiting, constipation, or diarrhea.  She has no urinary complaints.  Patient has no further specific complaints today.  REVIEW OF SYSTEMS:   Review of Systems  Constitutional: Negative.  Negative for fever, malaise/fatigue and weight loss.  Respiratory: Negative.  Negative for cough, hemoptysis and shortness of breath.   Cardiovascular: Negative.  Negative for chest pain and leg swelling.  Gastrointestinal: Negative.  Negative for abdominal pain.  Genitourinary: Negative.  Negative for dysuria.  Musculoskeletal: Negative.  Negative for back pain.  Skin: Negative.  Negative for rash.  Neurological: Negative.  Negative for dizziness, focal weakness, weakness and headaches.  Psychiatric/Behavioral: Negative.  The patient is not nervous/anxious.     As per HPI. Otherwise, a complete review of systems is negative.  PAST MEDICAL  HISTORY: Past Medical History:  Diagnosis Date  . Asthma   . Cancer [facility]) 1999   Left breast pre cancerous- mastectomy with reconstruction  . Hypertension     PAST SURGICAL HISTORY: Past Surgical History:  Procedure Laterality Date  . AUGMENTATION MAMMAPLASTY Left 1999  . BREAST BIOPSY Right 11/30/2018   affirm stereo bx of calcs, x marker, path pending  . BREAST BIOPSY Right 11/30/2018   affirm bx of calcs, ribbon marker,path pending  . BREAST LUMPECTOMY WITH NEEDLE LOCALIZATION AND AXILLARY SENTINEL LYMPH NODE BX Right 12/16/2018   Procedure: BREAST LUMPECTOMY WITH NEEDLE LOCALIZATION AND AXILLARY SENTINEL LYMPH NODE BX;  Surgeon: Robert Bellow, MD;  Location: [facility] ORS;  Service: General;  Laterality: Right;  . KIDNEY CYST REMOVAL    . MASTECTOMY Left 1999   Implant  . REDUCTION MAMMAPLASTY Right 1997    FAMILY HISTORY: Family History  Problem Relation Age of Onset  . Breast cancer Neg Hx     ADVANCED DIRECTIVES (Y/N):  N  HEALTH MAINTENANCE: Social History   Tobacco Use  . Smoking status: Never Smoker  . Smokeless tobacco: Never Used  Substance Use Topics  . Alcohol use: Never  . Drug use: Never     Colonoscopy:  PAP:  Bone density:  Lipid panel:  Allergies  Allergen Reactions  . Cephalexin Other (See Comments)    Possible headache     Current Outpatient Medications  Medication Sig Dispense Refill  . mometasone (NASONEX) 50 MCG/ACT nasal spray Place into the nose.    Marland Kitchen ADVAIR DISKUS 250-50 MCG/DOSE AEPB Inhale 1 puff into the lungs every 12 (twelve) hours.    . Biotin (BIOTIN 5000) 5 MG CAPS Take  5 mg by mouth daily.    . Cholecalciferol 25 MCG (1000 UT) tablet Take 1,000 Units by mouth daily.    Marland Kitchen guaiFENesin-dextromethorphan (ROBITUSSIN DM) 100-10 MG/5ML syrup Take 5 mLs by mouth every 4 (four) hours as needed for cough. 118 mL 0  . latanoprost (XALATAN) 0.005 % ophthalmic solution Apply 1 drop to eye at bedtime.    Marland Kitchen letrozole (FEMARA) 2.5 MG  tablet Take 1 tablet (2.5 mg total) by mouth daily. 90 tablet 3  . lidocaine-prilocaine (EMLA) cream     . Multiple Vitamins-Minerals (MULTIVITAL PLATINUM SILVER PO) Take 1 tablet by mouth daily.    . Multiple Vitamins-Minerals (PRESERVISION AREDS) CAPS Take 1 capsule by mouth 2 (two) times daily.     . potassium chloride (K-DUR,KLOR-CON) 10 MEQ tablet Take 10 mEq by mouth 2 (two) times daily.    Marland Kitchen PROAIR HFA 108 (90 Base) MCG/ACT inhaler Inhale 2 puffs into the lungs every 4 (four) hours as needed for wheezing.    Marland Kitchen spironolactone (ALDACTONE) 25 MG tablet Take 25 mg by mouth daily.    . traMADol (ULTRAM) 50 MG tablet Take 1 tablet (50 mg total) by mouth every 6 (six) hours as needed. 12 tablet 0   No current facility-administered medications for this visit.    OBJECTIVE: Vitals:   04/04/19 0912  BP: (!) 151/74  Pulse: 69  Resp: 18  Temp: (!) 97.4 F (36.3 C)  SpO2: 99%     Body mass index is 22.21 kg/m.    ECOG FS:0 - Asymptomatic  General: Well-developed, well-nourished, no acute distress. Eyes: Pink conjunctiva, anicteric sclera. HEENT: Normocephalic, moist mucous membranes. Lungs: No audible wheezing or coughing. Heart: Regular rate and rhythm. Abdomen: Soft, nontender, no obvious distention. Musculoskeletal: No edema, cyanosis, or clubbing. Neuro: Alert, answering all questions appropriately. Cranial nerves grossly intact. Skin: No rashes or petechiae noted. Psych: Normal affect.   LAB RESULTS:  Lab Results  Component Value Date   NA 140 04/04/2019   K 3.7 04/04/2019   CL 105 04/04/2019   CO2 27 04/04/2019   GLUCOSE 91 04/04/2019   BUN 32 (H) 04/04/2019   CREATININE 0.82 04/04/2019   CALCIUM 9.5 04/04/2019   PROT 6.2 (L) 04/04/2019   ALBUMIN 3.9 04/04/2019   AST 15 04/04/2019   ALT 16 04/04/2019   ALKPHOS 58 04/04/2019   BILITOT 0.7 04/04/2019   GFRNONAA >60 04/04/2019   GFRAA >60 04/04/2019    Lab Results  Component Value Date   WBC 5.7 04/04/2019    NEUTROABS 4.1 04/04/2019   HGB 12.6 04/04/2019   HCT 39.8 04/04/2019   MCV 92.1 04/04/2019   PLT 226 04/04/2019     STUDIES: DG Bone Density  Result Date: 03/21/2019 EXAM: DUAL X-RAY ABSORPTIOMETRY (DXA) FOR BONE MINERAL DENSITY IMPRESSION: Technologist: SCE Your patient Prerna Harold completed a BMD test on 03/21/2019 using the Fraser (analysis version: 14.10) manufactured by [employer]. The following summarizes the results of our evaluation. PATIENT BIOGRAPHICAL: Name: Tityana, Pagan Patient ID: [id number] Birth Date: 05/05/1927 Height: 63.0 in. Gender: Female Exam Date: 03/21/2019 Weight: 128.3 lbs. Indications: Advanced Age, Breast CA, Caucasian, Height Loss, High Risk Meds, History of Fracture (Adult), Hysterectomy, Postmenopausal, Previous Chemo and Radiation, Surgical Induced Menopause Fractures: Left ankle Treatments: Advair Inhaler, Letrozole, Multi-Vitamin, ProAir inhaler, Vitamin D ASSESSMENT: The BMD measured at Forearm Radius 33% is 0.624 g/cm2 with a T-score of -2.9. This patient is considered OSTEOPOROTIC according to [facility]) criteria. The  scan quality is good. Site Region Measured Measured WHO Young Adult BMD Date       Age      Classification T-score AP Spine L1-L4 03/21/2019 89.5 Normal 0.2 1.222 g/cm2 DualFemur Neck Right 03/21/2019 89.5 Osteopenia -1.1 0.884 g/cm2 Left Forearm Radius 33% 03/21/2019 89.5 Osteoporosis -2.9 0.624 g/cm2 World Health Organization [facility]) criteria for post-menopausal, Caucasian Women: Normal:       T-score at or above -1 SD Osteopenia:   T-score between -1 and -2.5 SD Osteoporosis: T-score at or below -2.5 SD RECOMMENDATIONS: 1. All patients should optimize calcium and vitamin D intake. 2. Consider FDA-approved medical therapies in postmenopausal women and men aged 68 years and older, based on the following: a. A hip or vertebral(clinical or morphometric) fracture b. T-score < -2.5 at the femoral neck or spine  after appropriate evaluation to exclude secondary causes c. Low bone mass (T-score between -1.0 and -2.5 at the femoral neck or spine) and a 10-year probability of a hip fracture > 3% or a 10-year probability of a major osteoporosis-related fracture > 20% based on the [place]-adapted WHO algorithm d. Clinician judgment and/or patient preferences may indicate treatment for people with 10-year fracture probabilities above or below these levels FOLLOW-UP: People with diagnosed cases of osteoporosis or at high risk for fracture should have regular bone mineral density tests. For patients eligible for Medicare, routine testing is allowed once every 2 years. The testing frequency can be increased to one year for patients who have rapidly progressing disease, those who are receiving or discontinuing medical therapy to restore bone mass, or have additional risk factors. I have reviewed this report, and agree with the above findings. Mark A. Thornton Papas, M.D. [facility] Electronically Signed   By: Lavonia Dana M.D.   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  PLAN:    1. 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  Although guidelines recommend reexcision given her close margins, the benefit of this is unclear given patient's advanced age and she ultimately chose not to undergo resection.  She has declined adjuvant treatment with chemotherapy, but would like to pursue single agent Orgivri.  She expressed understanding that this may not be as effective and her chance of recurrence increases.  MUGA scan on January 24, 2019 revealed an EF of 64.5%.  Repeat in February 2021.  Patient continues to  decline port placement at this time.  Patient completed adjuvant XRT.  Continue letrozole for a total of 5 years completing treatment in January 2026, but given her high risk disease, will consider extended treatment.  Proceed with cycle 4 of maintenance Orgivri today.  Return to clinic in 3 weeks for treatment only and then in 6 weeks for further evaluation and consideration of cycle 6.    2.  Bone health: Baseline bone mineral density on March 21, 2019 revealed a T score of -2.9 which is considered osteoporosis.  Consider changing letrozole and will add Fosamax in the near future.   I spent a total of 30 minutes reviewing chart data, face-to-face evaluation with the patient, counseling and coordination of care as detailed above.  Patient expressed understanding and was in agreement with this plan. She  also understands that She can call clinic at any time with any questions, concerns, or complaints.   Cancer Staging Primary cancer of upper outer quadrant of right female breast [facility]) Staging form: Breast, AJCC 8th Edition - Clinical stage from 12/12/2018: Stage IB (cT2, cN0, cM0, G3, ER+, PR+, HER2+) - Signed by Lloyd Huger, MD on 01/11/2019   Lloyd Huger, MD   04/04/2019 9:43 AM

## 2019-04-03 ENCOUNTER — Other Ambulatory Visit: Payer: Self-pay

## 2019-04-03 ENCOUNTER — Encounter: Payer: Self-pay | Admitting: Oncology

## 2019-04-03 NOTE — Progress Notes (Signed)
Patient prescreened for appointment. Patient has no concerns or questions.  

## 2019-04-04 ENCOUNTER — Inpatient Hospital Stay: Payer: PPO

## 2019-04-04 ENCOUNTER — Other Ambulatory Visit: Payer: Self-pay

## 2019-04-04 ENCOUNTER — Inpatient Hospital Stay: Payer: PPO | Attending: Oncology

## 2019-04-04 ENCOUNTER — Inpatient Hospital Stay (HOSPITAL_BASED_OUTPATIENT_CLINIC_OR_DEPARTMENT_OTHER): Payer: PPO | Admitting: Oncology

## 2019-04-04 VITALS — BP 151/74 | HR 69 | Temp 97.4°F | Resp 18 | Wt 129.4 lb

## 2019-04-04 DIAGNOSIS — Z170 Estrogen receptor positive status [ER+]: Secondary | ICD-10-CM | POA: Diagnosis not present

## 2019-04-04 DIAGNOSIS — D0511 Intraductal carcinoma in situ of right breast: Secondary | ICD-10-CM | POA: Diagnosis not present

## 2019-04-04 DIAGNOSIS — C50411 Malignant neoplasm of upper-outer quadrant of right female breast: Secondary | ICD-10-CM

## 2019-04-04 DIAGNOSIS — Z5112 Encounter for antineoplastic immunotherapy: Secondary | ICD-10-CM | POA: Insufficient documentation

## 2019-04-04 DIAGNOSIS — C50911 Malignant neoplasm of unspecified site of right female breast: Secondary | ICD-10-CM

## 2019-04-04 LAB — CBC WITH DIFFERENTIAL/PLATELET
Abs Immature Granulocytes: 0.01 10*3/uL (ref 0.00–0.07)
Basophils Absolute: 0 10*3/uL (ref 0.0–0.1)
Basophils Relative: 1 %
Eosinophils Absolute: 0.3 10*3/uL (ref 0.0–0.5)
Eosinophils Relative: 4 %
HCT: 39.8 % (ref 36.0–46.0)
Hemoglobin: 12.6 g/dL (ref 12.0–15.0)
Immature Granulocytes: 0 %
Lymphocytes Relative: 14 %
Lymphs Abs: 0.8 10*3/uL (ref 0.7–4.0)
MCH: 29.2 pg (ref 26.0–34.0)
MCHC: 31.7 g/dL (ref 30.0–36.0)
MCV: 92.1 fL (ref 80.0–100.0)
Monocytes Absolute: 0.5 10*3/uL (ref 0.1–1.0)
Monocytes Relative: 9 %
Neutro Abs: 4.1 10*3/uL (ref 1.7–7.7)
Neutrophils Relative %: 72 %
Platelets: 226 10*3/uL (ref 150–400)
RBC: 4.32 MIL/uL (ref 3.87–5.11)
RDW: 13.4 % (ref 11.5–15.5)
WBC: 5.7 10*3/uL (ref 4.0–10.5)
nRBC: 0 % (ref 0.0–0.2)

## 2019-04-04 LAB — COMPREHENSIVE METABOLIC PANEL
ALT: 16 U/L (ref 0–44)
AST: 15 U/L (ref 15–41)
Albumin: 3.9 g/dL (ref 3.5–5.0)
Alkaline Phosphatase: 58 U/L (ref 38–126)
Anion gap: 8 (ref 5–15)
BUN: 32 mg/dL — ABNORMAL HIGH (ref 8–23)
CO2: 27 mmol/L (ref 22–32)
Calcium: 9.5 mg/dL (ref 8.9–10.3)
Chloride: 105 mmol/L (ref 98–111)
Creatinine, Ser: 0.82 mg/dL (ref 0.44–1.00)
GFR calc Af Amer: >60 mL/min (ref >60)
GFR calc non Af Amer: >60 mL/min (ref >60)
Glucose, Bld: 91 mg/dL (ref 70–99)
Potassium: 3.7 mmol/L (ref 3.5–5.1)
Sodium: 140 mmol/L (ref 135–145)
Total Bilirubin: 0.7 mg/dL (ref 0.3–1.2)
Total Protein: 6.2 g/dL — ABNORMAL LOW (ref 6.5–8.1)

## 2019-04-04 MED ORDER — SODIUM CHLORIDE 0.9 % IV SOLN
Freq: Once | INTRAVENOUS | Status: AC
  Filled 2019-04-04: qty 250

## 2019-04-04 MED ORDER — TRASTUZUMAB-DKST CHEMO 150 MG IV SOLR
6.0000 mg/kg | Freq: Once | INTRAVENOUS | Status: AC
  Administered 2019-04-04: 11:00:00 357 mg via INTRAVENOUS
  Filled 2019-04-04: qty 17

## 2019-04-14 ENCOUNTER — Ambulatory Visit: Payer: PPO | Attending: Internal Medicine

## 2019-04-14 DIAGNOSIS — Z23 Encounter for immunization: Secondary | ICD-10-CM | POA: Insufficient documentation

## 2019-04-14 NOTE — Progress Notes (Signed)
   Covid-19 Vaccination Clinic  Name:  Hayley Nolan    MRN: [id number] DOB: 05/06/1927  04/14/2019  [name] was observed post Covid-19 immunization for 15 minutes without incidence. She was provided with Vaccine Information Sheet and instruction to access the V-Safe system.   [name] was instructed to call 911 with any severe reactions post vaccine: Marland Kitchen Difficulty breathing  . Swelling of your face and throat  . A fast heartbeat  . A bad rash all over your body  . Dizziness and weakness    Immunizations Administered    Name Date Dose VIS Date Route   Pfizer COVID-19 Vaccine 04/14/2019 11:25 AM 0.3 mL 02/10/2019 Intramuscular   Manufacturer: [place]   Lot: [id number]   [place]: [id number]

## 2019-04-24 ENCOUNTER — Other Ambulatory Visit: Payer: Self-pay

## 2019-04-25 ENCOUNTER — Other Ambulatory Visit: Payer: Self-pay

## 2019-04-25 ENCOUNTER — Inpatient Hospital Stay: Payer: PPO

## 2019-04-25 VITALS — BP 150/75 | HR 75 | Temp 97.3°F | Resp 18 | Wt 128.1 lb

## 2019-04-25 DIAGNOSIS — Z5112 Encounter for antineoplastic immunotherapy: Secondary | ICD-10-CM | POA: Diagnosis not present

## 2019-04-25 DIAGNOSIS — C50411 Malignant neoplasm of upper-outer quadrant of right female breast: Secondary | ICD-10-CM

## 2019-04-25 MED ORDER — SODIUM CHLORIDE 0.9 % IV SOLN
Freq: Once | INTRAVENOUS | Status: AC
  Filled 2019-04-25: qty 250

## 2019-04-25 MED ORDER — TRASTUZUMAB-DKST CHEMO 150 MG IV SOLR
6.0000 mg/kg | Freq: Once | INTRAVENOUS | Status: AC
  Administered 2019-04-25: 10:00:00 357 mg via INTRAVENOUS
  Filled 2019-04-25: qty 17

## 2019-05-09 ENCOUNTER — Ambulatory Visit: Payer: PPO | Attending: Internal Medicine

## 2019-05-09 DIAGNOSIS — Z23 Encounter for immunization: Secondary | ICD-10-CM | POA: Insufficient documentation

## 2019-05-09 NOTE — Progress Notes (Signed)
   Covid-19 Vaccination Clinic  Name:  Hayley Nolan    MRN: [id number] DOB: 1927/10/22  05/09/2019  Hayley Nolan was observed post Covid-19 immunization for 15 minutes without incident. She was provided with Vaccine Information Sheet and instruction to access the V-Safe system.   Hayley Nolan was instructed to call 911 with any severe reactions post vaccine: Marland Kitchen Difficulty breathing  . Swelling of face and throat  . A fast heartbeat  . A bad rash all over body  . Dizziness and weakness   Immunizations Administered    Name Date Dose VIS Date Route   Pfizer COVID-19 Vaccine 05/09/2019  1:33 PM 0.3 mL 02/10/2019 Intramuscular   Manufacturer: [place]   Lot: [id number]   [place]: [id number]

## 2019-05-10 ENCOUNTER — Other Ambulatory Visit: Payer: Self-pay

## 2019-05-10 ENCOUNTER — Encounter
Admission: RE | Admit: 2019-05-10 | Discharge: 2019-05-10 | Disposition: A | Discharge status: Home / Self Care | Payer: PPO | Source: Ambulatory Visit | Attending: Oncology | Admitting: Oncology

## 2019-05-10 DIAGNOSIS — Z79899 Other long term (current) drug therapy: Secondary | ICD-10-CM | POA: Diagnosis not present

## 2019-05-10 DIAGNOSIS — C50411 Malignant neoplasm of upper-outer quadrant of right female breast: Secondary | ICD-10-CM

## 2019-05-10 DIAGNOSIS — Z5181 Encounter for therapeutic drug level monitoring: Secondary | ICD-10-CM | POA: Insufficient documentation

## 2019-05-10 DIAGNOSIS — C50919 Malignant neoplasm of unspecified site of unspecified female breast: Secondary | ICD-10-CM | POA: Diagnosis not present

## 2019-05-10 MED ORDER — TECHNETIUM TC 99M-LABELED RED BLOOD CELLS IV KIT
20.0000 | PACK | Freq: Once | INTRAVENOUS | Status: AC | PRN
  Administered 2019-05-10: 18.89 via INTRAVENOUS

## 2019-05-11 NOTE — Progress Notes (Signed)
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  INTERVAL HISTORY: Patient returns to clinic today for further evaluation and consideration of cycle 6 of maintenance Orgivri.  She currently feels well and is asymptomatic.  She is tolerating her treatments well without significant side effects.  She continues to tolerate letrozole as well. She has no neurologic complaints.  She denies any recent fevers or illnesses.  She has a good appetite and denies weight loss.  She has no chest pain, shortness of breath, cough, or hemoptysis.  She denies any nausea, vomiting, constipation, or diarrhea.  She has no urinary complaints.  Patient offers no specific complaints today.  REVIEW OF SYSTEMS:   Review of Systems  Constitutional: Negative.  Negative for fever, malaise/fatigue and weight loss.  Respiratory: Negative.  Negative for cough, hemoptysis and shortness of breath.   Cardiovascular: Negative.  Negative for chest pain and leg swelling.  Gastrointestinal: Negative.  Negative for abdominal pain.  Genitourinary: Negative.  Negative for dysuria.  Musculoskeletal: Negative.  Negative for back pain.  Skin: Negative.  Negative for rash.  Neurological: Negative.  Negative for dizziness, focal weakness, weakness and headaches.  Psychiatric/Behavioral: Negative.  The patient is not nervous/anxious.     As per HPI. Otherwise, a complete review of systems is negative.  PAST MEDICAL HISTORY: Past Medical History:    Diagnosis Date  . Asthma   . Cancer [facility]) 1999   Left breast pre cancerous- mastectomy with reconstruction  . Hypertension     PAST SURGICAL HISTORY: Past Surgical History:  Procedure Laterality Date  . AUGMENTATION MAMMAPLASTY Left 1999  . BREAST BIOPSY Right 11/30/2018   affirm stereo bx of calcs, x marker, path pending  . BREAST BIOPSY Right 11/30/2018   affirm bx of calcs, ribbon marker,path pending  . BREAST LUMPECTOMY WITH NEEDLE LOCALIZATION AND AXILLARY SENTINEL LYMPH NODE BX Right 12/16/2018   Procedure: BREAST LUMPECTOMY WITH NEEDLE LOCALIZATION AND AXILLARY SENTINEL LYMPH NODE BX;  Surgeon: Robert Bellow, MD;  Location: [facility] ORS;  Service: General;  Laterality: Right;  . KIDNEY CYST REMOVAL    . MASTECTOMY Left 1999   Implant  . REDUCTION MAMMAPLASTY Right 1997    FAMILY HISTORY: Family History  Problem Relation Age of Onset  . Breast cancer Neg Hx     ADVANCED DIRECTIVES (Y/N):  N  HEALTH MAINTENANCE: Social History   Tobacco Use  . Smoking status: Never Smoker  . Smokeless tobacco: Never Used  Substance Use Topics  . Alcohol use: Never  . Drug use: Never     Colonoscopy:  PAP:  Bone density:  Lipid panel:  Allergies  Allergen Reactions  . Cephalexin Other (See Comments)    Possible headache     Current Outpatient Medications  Medication Sig Dispense Refill  . ADVAIR DISKUS 250-50 MCG/DOSE AEPB Inhale 1 puff into the lungs every 12 (twelve) hours.    . Biotin (BIOTIN 5000) 5 MG CAPS Take 5 mg by mouth daily.    . Cholecalciferol 25 MCG (1000 UT) tablet Take 1,000 Units  by mouth daily.    Marland Kitchen letrozole (FEMARA) 2.5 MG tablet Take 1 tablet (2.5 mg total) by mouth daily. 90 tablet 3  . lidocaine-prilocaine (EMLA) cream     . Multiple Vitamins-Minerals (MULTIVITAL PLATINUM SILVER PO) Take 1 tablet by mouth daily.    . Multiple Vitamins-Minerals (PRESERVISION AREDS) CAPS Take 1 capsule by mouth 2 (two) times daily.     . potassium chloride  (K-DUR,KLOR-CON) 10 MEQ tablet Take 10 mEq by mouth 2 (two) times daily.    Marland Kitchen PROAIR HFA 108 (90 Base) MCG/ACT inhaler Inhale 2 puffs into the lungs every 4 (four) hours as needed for wheezing.    Marland Kitchen spironolactone (ALDACTONE) 25 MG tablet Take 25 mg by mouth daily.    . traMADol (ULTRAM) 50 MG tablet Take 1 tablet (50 mg total) by mouth every 6 (six) hours as needed. 12 tablet 0  . trastuzumab-dkst 2 mg/kg in sodium chloride 0.9 % 250 mL Inject 2 mg/kg into the vein once.    Marland Kitchen alendronate (FOSAMAX) 70 MG tablet Take 1 tablet (70 mg total) by mouth once a week. Take with a full glass of water on an empty stomach. 12 tablet 3  . guaiFENesin-dextromethorphan (ROBITUSSIN DM) 100-10 MG/5ML syrup Take 5 mLs by mouth every 4 (four) hours as needed for cough. (Patient not taking: Reported on 05/16/2019) 118 mL 0  . latanoprost (XALATAN) 0.005 % ophthalmic solution Apply 1 drop to eye at bedtime.    . mometasone (NASONEX) 50 MCG/ACT nasal spray Place into the nose.     No current facility-administered medications for this visit.    OBJECTIVE: Vitals:   05/16/19 0855  BP: 140/66  Pulse: 84  Resp: 18  Temp: (!) 96.2 F (35.7 C)     Body mass index is 22.31 kg/m.    ECOG FS:0 - Asymptomatic  General: Well-developed, well-nourished, no acute distress. Eyes: Pink conjunctiva, anicteric sclera. HEENT: Normocephalic, moist mucous membranes. Lungs: No audible wheezing or coughing. Heart: Regular rate and rhythm. Abdomen: Soft, nontender, no obvious distention. Musculoskeletal: No edema, cyanosis, or clubbing. Neuro: Alert, answering all questions appropriately. Cranial nerves grossly intact. Skin: No rashes or petechiae noted. Psych: Normal affect.  LAB RESULTS:  Lab Results  Component Value Date   NA 140 05/16/2019   K 4.1 05/16/2019   CL 105 05/16/2019   CO2 25 05/16/2019   GLUCOSE 96 05/16/2019   BUN 27 (H) 05/16/2019   CREATININE 0.99 05/16/2019   CALCIUM 9.7 05/16/2019   PROT 6.7  05/16/2019   ALBUMIN 4.3 05/16/2019   AST 19 05/16/2019   ALT 19 05/16/2019   ALKPHOS 64 05/16/2019   BILITOT 0.9 05/16/2019   GFRNONAA 51 (L) 05/16/2019   GFRAA 59 (L) 05/16/2019    Lab Results  Component Value Date   WBC 7.7 05/16/2019   NEUTROABS 5.6 05/16/2019   HGB 13.4 05/16/2019   HCT 40.6 05/16/2019   MCV 88.3 05/16/2019   PLT 263 05/16/2019     STUDIES: NM Cardiac Muga Rest  Result Date: 05/10/2019 CLINICAL DATA:  Breast cancer. Evaluate cardiac function in relation to chemotherapy. EXAM: NUCLEAR MEDICINE CARDIAC BLOOD POOL IMAGING (MUGA) TECHNIQUE: Cardiac multi-gated acquisition was performed at rest following intravenous injection of Tc-57mlabeled red blood cells. RADIOPHARMACEUTICALS:  Eighteen point mCi Tc-933mertechnetate in-vitro labeled red blood cells IV COMPARISON:  MUGA scan 01/24/2019 FINDINGS: No  focal wall motion abnormality of the left ventricle. Calculated left ventricular ejection fraction equals 59.6 %. IMPRESSION: Left ventricular ejection fraction  equals59.6%. Comparable to 62% on 01/24/2019. Electronically Signed   By: Suzy Bouchard M.D.   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  PLAN:    1. 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  Although guidelines recommend reexcision given her close margins, the benefit of this is unclear given patient's advanced age and she ultimately chose not to undergo resection.  She has now completed adjuvant XRT.  She declined adjuvant treatment with chemotherapy, but would like to pursue single agent Orgivri.  She expressed understanding that this may not be as effective and her chance of  recurrence increases.  Repeat MUGA scan on May 10, 2019 revealed an ejection fraction of 59.6% which is essentially unchanged from previous.  Repeat in June 2021.   Continue letrozole for a total of 5 years completing treatment in January 2026, but given her high risk disease, will consider extended treatment.  Proceed with cycle 6 of maintenance Orgivri today.  Return to clinic in 3 weeks for treatment only and then in 6 weeks for further evaluation and consideration of cycle 8.   2.  Osteoporosis: Baseline bone mineral density on March 21, 2019 revealed a T score of -2.9 which is considered osteoporosis.  Patient was given a prescription for Fosamax today and encouraged to continue calcium and vitamin D supplementation.  I spent a total of 30 minutes reviewing chart data, face-to-face evaluation with the patient, counseling and coordination of care as detailed above.   Patient expressed understanding and was in agreement with this plan. She also understands that She can call clinic at any time with any questions, concerns, or complaints.   Cancer Staging Primary cancer of upper outer quadrant of right female breast [facility]) Staging form: Breast, AJCC 8th Edition - Clinical stage from 12/12/2018: Stage IB (cT2, cN0, cM0, G3, ER+, PR+, HER2+) - Signed by Lloyd Huger, MD on 01/11/2019   Lloyd Huger, MD   05/16/2019 9:32 AM

## 2019-05-16 ENCOUNTER — Other Ambulatory Visit: Payer: Self-pay

## 2019-05-16 ENCOUNTER — Encounter: Payer: Self-pay | Admitting: Oncology

## 2019-05-16 ENCOUNTER — Inpatient Hospital Stay: Payer: PPO | Attending: Oncology

## 2019-05-16 ENCOUNTER — Inpatient Hospital Stay (HOSPITAL_BASED_OUTPATIENT_CLINIC_OR_DEPARTMENT_OTHER): Payer: PPO | Admitting: Oncology

## 2019-05-16 ENCOUNTER — Inpatient Hospital Stay: Payer: PPO

## 2019-05-16 VITALS — BP 140/66 | HR 84 | Temp 96.2°F | Resp 18 | Wt 130.0 lb

## 2019-05-16 DIAGNOSIS — D0511 Intraductal carcinoma in situ of right breast: Secondary | ICD-10-CM | POA: Diagnosis not present

## 2019-05-16 DIAGNOSIS — Z5112 Encounter for antineoplastic immunotherapy: Secondary | ICD-10-CM | POA: Diagnosis not present

## 2019-05-16 DIAGNOSIS — C50411 Malignant neoplasm of upper-outer quadrant of right female breast: Secondary | ICD-10-CM

## 2019-05-16 DIAGNOSIS — Z170 Estrogen receptor positive status [ER+]: Secondary | ICD-10-CM | POA: Insufficient documentation

## 2019-05-16 DIAGNOSIS — C50911 Malignant neoplasm of unspecified site of right female breast: Secondary | ICD-10-CM

## 2019-05-16 LAB — CBC WITH DIFFERENTIAL/PLATELET
Abs Immature Granulocytes: 0.02 10*3/uL (ref 0.00–0.07)
Basophils Absolute: 0 10*3/uL (ref 0.0–0.1)
Basophils Relative: 0 %
Eosinophils Absolute: 0.3 10*3/uL (ref 0.0–0.5)
Eosinophils Relative: 3 %
HCT: 40.6 % (ref 36.0–46.0)
Hemoglobin: 13.4 g/dL (ref 12.0–15.0)
Immature Granulocytes: 0 %
Lymphocytes Relative: 14 %
Lymphs Abs: 1.1 10*3/uL (ref 0.7–4.0)
MCH: 29.1 pg (ref 26.0–34.0)
MCHC: 33 g/dL (ref 30.0–36.0)
MCV: 88.3 fL (ref 80.0–100.0)
Monocytes Absolute: 0.8 10*3/uL (ref 0.1–1.0)
Monocytes Relative: 10 %
Neutro Abs: 5.6 10*3/uL (ref 1.7–7.7)
Neutrophils Relative %: 73 %
Platelets: 263 10*3/uL (ref 150–400)
RBC: 4.6 MIL/uL (ref 3.87–5.11)
RDW: 13.6 % (ref 11.5–15.5)
WBC: 7.7 10*3/uL (ref 4.0–10.5)
nRBC: 0 % (ref 0.0–0.2)

## 2019-05-16 LAB — COMPREHENSIVE METABOLIC PANEL
ALT: 19 U/L (ref 0–44)
AST: 19 U/L (ref 15–41)
Albumin: 4.3 g/dL (ref 3.5–5.0)
Alkaline Phosphatase: 64 U/L (ref 38–126)
Anion gap: 10 (ref 5–15)
BUN: 27 mg/dL — ABNORMAL HIGH (ref 8–23)
CO2: 25 mmol/L (ref 22–32)
Calcium: 9.7 mg/dL (ref 8.9–10.3)
Chloride: 105 mmol/L (ref 98–111)
Creatinine, Ser: 0.99 mg/dL (ref 0.44–1.00)
GFR calc Af Amer: 59 mL/min — ABNORMAL LOW (ref >60)
GFR calc non Af Amer: 51 mL/min — ABNORMAL LOW (ref >60)
Glucose, Bld: 96 mg/dL (ref 70–99)
Potassium: 4.1 mmol/L (ref 3.5–5.1)
Sodium: 140 mmol/L (ref 135–145)
Total Bilirubin: 0.9 mg/dL (ref 0.3–1.2)
Total Protein: 6.7 g/dL (ref 6.5–8.1)

## 2019-05-16 MED ORDER — ALENDRONATE SODIUM 70 MG PO TABS: 70.0000 mg | ORAL_TABLET | ORAL | 3 refills | Status: DC

## 2019-05-16 MED ORDER — SODIUM CHLORIDE 0.9 % IV SOLN
Freq: Once | INTRAVENOUS | Status: AC
  Filled 2019-05-16: qty 250

## 2019-05-16 MED ORDER — TRASTUZUMAB-DKST CHEMO 150 MG IV SOLR
6.0000 mg/kg | Freq: Once | INTRAVENOUS | Status: AC
  Administered 2019-05-16: 357 mg via INTRAVENOUS
  Filled 2019-05-16: qty 17

## 2019-05-29 NOTE — Progress Notes (Signed)
Pharmacist Chemotherapy Monitoring - Follow Up Assessment    I verify that I have reviewed each item in the below checklist:  . Regimen for the patient is scheduled for the appropriate day and plan matches scheduled date. Marland Kitchen Appropriate non-routine labs are ordered dependent on drug ordered. . If applicable, additional medications reviewed and ordered per protocol based on lifetime cumulative doses and/or treatment regimen.   Plan for follow-up and/or issues identified: No . I-vent associated with next due treatment: No . MD and/or nursing notified: No  Hayley Nolan K 05/29/2019 1:50 PM

## 2019-06-06 ENCOUNTER — Other Ambulatory Visit: Payer: Self-pay

## 2019-06-06 ENCOUNTER — Inpatient Hospital Stay: Payer: PPO | Attending: Oncology

## 2019-06-06 ENCOUNTER — Ambulatory Visit: Payer: PPO

## 2019-06-06 VITALS — BP 156/78 | HR 70 | Temp 97.2°F | Wt 131.8 lb

## 2019-06-06 DIAGNOSIS — Z5112 Encounter for antineoplastic immunotherapy: Secondary | ICD-10-CM | POA: Diagnosis not present

## 2019-06-06 DIAGNOSIS — C50411 Malignant neoplasm of upper-outer quadrant of right female breast: Secondary | ICD-10-CM | POA: Diagnosis not present

## 2019-06-06 MED ORDER — SODIUM CHLORIDE 0.9 % IV SOLN
Freq: Once | INTRAVENOUS | Status: AC
  Filled 2019-06-06: qty 250

## 2019-06-06 MED ORDER — TRASTUZUMAB-DKST CHEMO 150 MG IV SOLR
6.0000 mg/kg | Freq: Once | INTRAVENOUS | Status: AC
  Administered 2019-06-06: 357 mg via INTRAVENOUS
  Filled 2019-06-06: qty 17

## 2019-06-08 DIAGNOSIS — Z923 Personal history of irradiation: Secondary | ICD-10-CM | POA: Diagnosis not present

## 2019-06-08 DIAGNOSIS — C50411 Malignant neoplasm of upper-outer quadrant of right female breast: Secondary | ICD-10-CM | POA: Diagnosis not present

## 2019-06-20 NOTE — Progress Notes (Signed)

## 2019-06-24 NOTE — Progress Notes (Signed)
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  INTERVAL HISTORY: Patient returns to clinic today for further evaluation and consideration of cycle 8 of maintenance Orgivri.  She continues to tolerate her treatments well without significant side effects.  She currently feels well and is asymptomatic.  She is tolerating letrozole and Fosamax as well.  She has no neurologic complaints.  She denies any recent fevers or illnesses.  She has a good appetite and denies weight loss.  She has no chest pain, shortness of breath, cough, or hemoptysis.  She denies any nausea, vomiting, constipation, or diarrhea.  She has no urinary complaints.  Patient offers no specific complaints today.  REVIEW OF SYSTEMS:   Review of Systems  Constitutional: Negative.  Negative for fever, malaise/fatigue and weight loss.  Respiratory: Negative.  Negative for cough, hemoptysis and shortness of breath.   Cardiovascular: Negative.  Negative for chest pain and leg swelling.  Gastrointestinal: Negative.  Negative for abdominal pain.  Genitourinary: Negative.  Negative for dysuria.  Musculoskeletal: Negative.  Negative for back pain.  Skin: Negative.  Negative for rash.  Neurological: Negative.  Negative for dizziness, focal weakness, weakness and headaches.  Psychiatric/Behavioral: Negative.  The patient is not nervous/anxious.     As per HPI. Otherwise, a  complete review of systems is negative.  PAST MEDICAL HISTORY: Past Medical History:  Diagnosis Date  . Asthma   . Cancer [facility]) 1999   Left breast pre cancerous- mastectomy with reconstruction  . Hypertension     PAST SURGICAL HISTORY: Past Surgical History:  Procedure Laterality Date  . AUGMENTATION MAMMAPLASTY Left 1999  . BREAST BIOPSY Right 11/30/2018   affirm stereo bx of calcs, x marker, path pending  . BREAST BIOPSY Right 11/30/2018   affirm bx of calcs, ribbon marker,path pending  . BREAST LUMPECTOMY WITH NEEDLE LOCALIZATION AND AXILLARY SENTINEL LYMPH NODE BX Right 12/16/2018   Procedure: BREAST LUMPECTOMY WITH NEEDLE LOCALIZATION AND AXILLARY SENTINEL LYMPH NODE BX;  Surgeon: Robert Bellow, MD;  Location: [facility] ORS;  Service: General;  Laterality: Right;  . KIDNEY CYST REMOVAL    . MASTECTOMY Left 1999   Implant  . REDUCTION MAMMAPLASTY Right 1997    FAMILY HISTORY: Family History  Problem Relation Age of Onset  . Breast cancer Neg Hx     ADVANCED DIRECTIVES (Y/N):  N  HEALTH MAINTENANCE: Social History   Tobacco Use  . Smoking status: Never Smoker  . Smokeless tobacco: Never Used  Substance Use Topics  . Alcohol use: Never  . Drug use: Never     Colonoscopy:  PAP:  Bone density:  Lipid panel:  Allergies  Allergen Reactions  . Cephalexin Other (See Comments)    Possible headache     Current Outpatient Medications  Medication Sig Dispense Refill  . ADVAIR DISKUS 250-50 MCG/DOSE AEPB Inhale 1 puff into the lungs every 12 (twelve) hours.    Marland Kitchen alendronate (FOSAMAX) 70 MG tablet Take 1 tablet (70 mg total) by mouth  once a week. Take with a full glass of water on an empty stomach. 12 tablet 3  . Biotin (BIOTIN 5000) 5 MG CAPS Take 5 mg by mouth daily.    . Cholecalciferol 25 MCG (1000 UT) tablet Take 1,000 Units by mouth daily.    Marland Kitchen letrozole (FEMARA) 2.5 MG tablet Take 1 tablet (2.5 mg total) by mouth daily. 90 tablet 3  . lidocaine-prilocaine  (EMLA) cream     . mometasone (NASONEX) 50 MCG/ACT nasal spray Place into the nose.    . Multiple Vitamins-Minerals (MULTIVITAL PLATINUM SILVER PO) Take 1 tablet by mouth daily.    . Multiple Vitamins-Minerals (PRESERVISION AREDS) CAPS Take 1 capsule by mouth 2 (two) times daily.     . potassium chloride (K-DUR,KLOR-CON) 10 MEQ tablet Take 10 mEq by mouth 2 (two) times daily.    Marland Kitchen PROAIR HFA 108 (90 Base) MCG/ACT inhaler Inhale 2 puffs into the lungs every 4 (four) hours as needed for wheezing.    Marland Kitchen spironolactone (ALDACTONE) 25 MG tablet Take 25 mg by mouth daily.    . trastuzumab-dkst 2 mg/kg in sodium chloride 0.9 % 250 mL Inject 2 mg/kg into the vein once.     No current facility-administered medications for this visit.    OBJECTIVE: Vitals:   06/27/19 1105  BP: (!) 167/85  Pulse: 71  Resp: 18  Temp: (!) 97.3 F (36.3 C)  SpO2: 99%     Body mass index is 22.25 kg/m.    ECOG FS:0 - Asymptomatic  General: Well-developed, well-nourished, no acute distress. Eyes: Pink conjunctiva, anicteric sclera. HEENT: Normocephalic, moist mucous membranes. Lungs: No audible wheezing or coughing. Heart: Regular rate and rhythm. Abdomen: Soft, nontender, no obvious distention. Musculoskeletal: No edema, cyanosis, or clubbing. Neuro: Alert, answering all questions appropriately. Cranial nerves grossly intact. Skin: No rashes or petechiae noted. Psych: Normal affect.   LAB RESULTS:  Lab Results  Component Value Date   NA 141 06/27/2019   K 3.7 06/27/2019   CL 103 06/27/2019   CO2 28 06/27/2019   GLUCOSE 94 06/27/2019   BUN 26 (H) 06/27/2019   CREATININE 0.81 06/27/2019   CALCIUM 9.2 06/27/2019   PROT 6.3 (L) 06/27/2019   ALBUMIN 4.0 06/27/2019   AST 19 06/27/2019   ALT 18 06/27/2019   ALKPHOS 57 06/27/2019   BILITOT 0.7 06/27/2019   GFRNONAA >60 06/27/2019   GFRAA >60 06/27/2019    Lab Results  Component Value Date   WBC 7.7 06/27/2019   NEUTROABS 6.0 06/27/2019   HGB  13.1 06/27/2019   HCT 40.8 06/27/2019   MCV 91.7 06/27/2019   PLT 244 06/27/2019     STUDIES: No results found.  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  PLAN:    1. 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  Although guidelines recommend reexcision given her close margins, the benefit of this is unclear given patient's advanced age and she ultimately chose not to undergo resection.  She completed adjuvant XRT.  She declined adjuvant treatment with chemotherapy, but wanted to pursue single agent Orgivri.  She expressed understanding that this may not be as effective and her chance of recurrence increases.  MUGA scan on May 10, 2019 revealed an  ejection fraction of 59.6% which is essentially unchanged from previous.  Repeat in June 2021.   Continue letrozole for a total of 5 years completing treatment in January 2026, but given her high risk disease, will consider extended treatment.  Proceed with cycle 8 of maintenance Orgivri today.  Return to clinic in 3 weeks for treatment only and then in 6 weeks for further evaluation and consideration of cycle 10.   2.  Osteoporosis: Baseline bone mineral density on March 21, 2019 revealed a T score of -2.9 which is considered osteoporosis.  Continue Fosamax, calcium, and vitamin D.  I spent a total of 30 minutes reviewing chart data, face-to-face evaluation with the patient, counseling and coordination of care as detailed above.   Patient expressed understanding and was in agreement with this plan. She also understands that She can call clinic at any time with any questions, concerns, or complaints.   Cancer Staging Primary cancer of upper outer  quadrant of right female breast [facility]) Staging form: Breast, AJCC 8th Edition - Clinical stage from 12/12/2018: Stage IB (cT2, cN0, cM0, G3, ER+, PR+, HER2+) - Signed by Lloyd Huger, MD on 01/11/2019   Lloyd Huger, MD   06/27/2019 2:46 PM

## 2019-06-26 ENCOUNTER — Encounter: Payer: Self-pay | Admitting: Oncology

## 2019-06-26 ENCOUNTER — Other Ambulatory Visit: Payer: Self-pay

## 2019-06-26 NOTE — Progress Notes (Signed)
Patient prescreened for appointment. Patient has no concerns or questions.  

## 2019-06-27 ENCOUNTER — Other Ambulatory Visit: Payer: PPO

## 2019-06-27 ENCOUNTER — Inpatient Hospital Stay: Payer: PPO

## 2019-06-27 ENCOUNTER — Inpatient Hospital Stay (HOSPITAL_BASED_OUTPATIENT_CLINIC_OR_DEPARTMENT_OTHER): Payer: PPO | Admitting: Oncology

## 2019-06-27 ENCOUNTER — Ambulatory Visit: Payer: PPO | Admitting: Oncology

## 2019-06-27 ENCOUNTER — Encounter: Payer: Self-pay | Admitting: Oncology

## 2019-06-27 ENCOUNTER — Ambulatory Visit: Payer: PPO

## 2019-06-27 VITALS — BP 167/85 | HR 71 | Temp 97.3°F | Resp 18 | Wt 129.6 lb

## 2019-06-27 DIAGNOSIS — Z5112 Encounter for antineoplastic immunotherapy: Secondary | ICD-10-CM | POA: Diagnosis not present

## 2019-06-27 DIAGNOSIS — C50411 Malignant neoplasm of upper-outer quadrant of right female breast: Secondary | ICD-10-CM

## 2019-06-27 DIAGNOSIS — C50911 Malignant neoplasm of unspecified site of right female breast: Secondary | ICD-10-CM

## 2019-06-27 LAB — COMPREHENSIVE METABOLIC PANEL
ALT: 18 U/L (ref 0–44)
AST: 19 U/L (ref 15–41)
Albumin: 4 g/dL (ref 3.5–5.0)
Alkaline Phosphatase: 57 U/L (ref 38–126)
Anion gap: 10 (ref 5–15)
BUN: 26 mg/dL — ABNORMAL HIGH (ref 8–23)
CO2: 28 mmol/L (ref 22–32)
Calcium: 9.2 mg/dL (ref 8.9–10.3)
Chloride: 103 mmol/L (ref 98–111)
Creatinine, Ser: 0.81 mg/dL (ref 0.44–1.00)
GFR calc Af Amer: >60 mL/min (ref >60)
GFR calc non Af Amer: >60 mL/min (ref >60)
Glucose, Bld: 94 mg/dL (ref 70–99)
Potassium: 3.7 mmol/L (ref 3.5–5.1)
Sodium: 141 mmol/L (ref 135–145)
Total Bilirubin: 0.7 mg/dL (ref 0.3–1.2)
Total Protein: 6.3 g/dL — ABNORMAL LOW (ref 6.5–8.1)

## 2019-06-27 LAB — CBC WITH DIFFERENTIAL/PLATELET
Abs Immature Granulocytes: 0.03 10*3/uL (ref 0.00–0.07)
Basophils Absolute: 0 10*3/uL (ref 0.0–0.1)
Basophils Relative: 0 %
Eosinophils Absolute: 0.2 10*3/uL (ref 0.0–0.5)
Eosinophils Relative: 2 %
HCT: 40.8 % (ref 36.0–46.0)
Hemoglobin: 13.1 g/dL (ref 12.0–15.0)
Immature Granulocytes: 0 %
Lymphocytes Relative: 12 %
Lymphs Abs: 0.9 10*3/uL (ref 0.7–4.0)
MCH: 29.4 pg (ref 26.0–34.0)
MCHC: 32.1 g/dL (ref 30.0–36.0)
MCV: 91.7 fL (ref 80.0–100.0)
Monocytes Absolute: 0.5 10*3/uL (ref 0.1–1.0)
Monocytes Relative: 7 %
Neutro Abs: 6 10*3/uL (ref 1.7–7.7)
Neutrophils Relative %: 79 %
Platelets: 244 10*3/uL (ref 150–400)
RBC: 4.45 MIL/uL (ref 3.87–5.11)
RDW: 14 % (ref 11.5–15.5)
WBC: 7.7 10*3/uL (ref 4.0–10.5)
nRBC: 0 % (ref 0.0–0.2)

## 2019-06-27 MED ORDER — TRASTUZUMAB-DKST CHEMO 150 MG IV SOLR
6.0000 mg/kg | Freq: Once | INTRAVENOUS | Status: AC
  Administered 2019-06-27: 357 mg via INTRAVENOUS
  Filled 2019-06-27: qty 17

## 2019-06-27 MED ORDER — SODIUM CHLORIDE 0.9 % IV SOLN
Freq: Once | INTRAVENOUS | Status: AC
  Filled 2019-06-27: qty 250

## 2019-07-10 DIAGNOSIS — H6123 Impacted cerumen, bilateral: Secondary | ICD-10-CM | POA: Diagnosis not present

## 2019-07-10 DIAGNOSIS — H902 Conductive hearing loss, unspecified: Secondary | ICD-10-CM | POA: Diagnosis not present

## 2019-07-11 DIAGNOSIS — Z961 Presence of intraocular lens: Secondary | ICD-10-CM | POA: Diagnosis not present

## 2019-07-11 NOTE — Progress Notes (Signed)
Pharmacist Chemotherapy Monitoring - Follow Up Assessment    I verify that I have reviewed each item in the below checklist:  . Regimen for the patient is scheduled for the appropriate day and plan matches scheduled date. Marland Kitchen Appropriate non-routine labs are ordered dependent on drug ordered. . If applicable, additional medications reviewed and ordered per protocol based on lifetime cumulative doses and/or treatment regimen.   Plan for follow-up and/or issues identified: No . I-vent associated with next due treatment: No . MD and/or nursing notified: No  Allante Whitmire K 07/11/2019 8:05 AM

## 2019-07-18 ENCOUNTER — Inpatient Hospital Stay: Payer: PPO | Attending: Oncology

## 2019-07-18 ENCOUNTER — Ambulatory Visit: Payer: PPO | Admitting: Dermatology

## 2019-07-18 ENCOUNTER — Inpatient Hospital Stay: Payer: PPO

## 2019-07-18 ENCOUNTER — Other Ambulatory Visit: Payer: Self-pay | Admitting: Oncology

## 2019-07-18 ENCOUNTER — Other Ambulatory Visit: Payer: Self-pay

## 2019-07-18 VITALS — BP 157/77 | HR 75 | Temp 97.6°F | Resp 20 | Wt 127.8 lb

## 2019-07-18 DIAGNOSIS — Z5112 Encounter for antineoplastic immunotherapy: Secondary | ICD-10-CM | POA: Diagnosis not present

## 2019-07-18 DIAGNOSIS — Z85828 Personal history of other malignant neoplasm of skin: Secondary | ICD-10-CM

## 2019-07-18 DIAGNOSIS — C50411 Malignant neoplasm of upper-outer quadrant of right female breast: Secondary | ICD-10-CM | POA: Diagnosis not present

## 2019-07-18 DIAGNOSIS — I781 Nevus, non-neoplastic: Secondary | ICD-10-CM | POA: Diagnosis not present

## 2019-07-18 DIAGNOSIS — C50911 Malignant neoplasm of unspecified site of right female breast: Secondary | ICD-10-CM

## 2019-07-18 DIAGNOSIS — L821 Other seborrheic keratosis: Secondary | ICD-10-CM | POA: Diagnosis not present

## 2019-07-18 DIAGNOSIS — D1801 Hemangioma of skin and subcutaneous tissue: Secondary | ICD-10-CM

## 2019-07-18 LAB — COMPREHENSIVE METABOLIC PANEL
ALT: 19 U/L (ref 0–44)
AST: 18 U/L (ref 15–41)
Albumin: 4 g/dL (ref 3.5–5.0)
Alkaline Phosphatase: 53 U/L (ref 38–126)
Anion gap: 8 (ref 5–15)
BUN: 25 mg/dL — ABNORMAL HIGH (ref 8–23)
CO2: 26 mmol/L (ref 22–32)
Calcium: 9.4 mg/dL (ref 8.9–10.3)
Chloride: 105 mmol/L (ref 98–111)
Creatinine, Ser: 0.8 mg/dL (ref 0.44–1.00)
GFR calc Af Amer: >60 mL/min (ref >60)
GFR calc non Af Amer: >60 mL/min (ref >60)
Glucose, Bld: 103 mg/dL — ABNORMAL HIGH (ref 70–99)
Potassium: 4 mmol/L (ref 3.5–5.1)
Sodium: 139 mmol/L (ref 135–145)
Total Bilirubin: 0.9 mg/dL (ref 0.3–1.2)
Total Protein: 6.4 g/dL — ABNORMAL LOW (ref 6.5–8.1)

## 2019-07-18 LAB — CBC WITH DIFFERENTIAL/PLATELET
Abs Immature Granulocytes: 0.02 10*3/uL (ref 0.00–0.07)
Basophils Absolute: 0 10*3/uL (ref 0.0–0.1)
Basophils Relative: 1 %
Eosinophils Absolute: 0.2 10*3/uL (ref 0.0–0.5)
Eosinophils Relative: 3 %
HCT: 36.4 % (ref 36.0–46.0)
Hemoglobin: 12.3 g/dL (ref 12.0–15.0)
Immature Granulocytes: 0 %
Lymphocytes Relative: 14 %
Lymphs Abs: 1 10*3/uL (ref 0.7–4.0)
MCH: 30 pg (ref 26.0–34.0)
MCHC: 33.8 g/dL (ref 30.0–36.0)
MCV: 88.8 fL (ref 80.0–100.0)
Monocytes Absolute: 0.5 10*3/uL (ref 0.1–1.0)
Monocytes Relative: 7 %
Neutro Abs: 5.1 10*3/uL (ref 1.7–7.7)
Neutrophils Relative %: 75 %
Platelets: 240 10*3/uL (ref 150–400)
RBC: 4.1 MIL/uL (ref 3.87–5.11)
RDW: 14 % (ref 11.5–15.5)
WBC: 6.7 10*3/uL (ref 4.0–10.5)
nRBC: 0 % (ref 0.0–0.2)

## 2019-07-18 MED ORDER — SODIUM CHLORIDE 0.9 % IV SOLN
Freq: Once | INTRAVENOUS | Status: AC
  Filled 2019-07-18: qty 250

## 2019-07-18 MED ORDER — TRASTUZUMAB-DKST CHEMO 150 MG IV SOLR
6.0000 mg/kg | Freq: Once | INTRAVENOUS | Status: AC
  Administered 2019-07-18: 357 mg via INTRAVENOUS
  Filled 2019-07-18: qty 17

## 2019-07-18 NOTE — Progress Notes (Signed)
   Follow-Up Visit   Subjective  Hayley Nolan is a 86 y.o. female who presents for the following: Skin Problem.  Patient here for spots at right arm. No symptoms. Present for at least a few months. There is also a spot on mid chest that has gotten scaly.  She has a h/o of BCC L chin 2018.   The following portions of the chart were reviewed this encounter and updated as appropriate:     Review of Systems:  No other skin or systemic complaints except as noted in HPI or Assessment and Plan.  Objective  Well appearing patient in no apparent distress; mood and affect are within normal limits.  A focused examination was performed including chest, arms. Relevant physical exam findings are noted in the Assessment and Plan.  Objective  Left Chin: Well healed scar with no evidence of recurrence.   Objective  Right Forearm, Intermammary chest: Stuck-on, waxy, tan-brown papules and plaques -- Discussed benign etiology and prognosis.  Waxy tan patch at intermammary area.  Objective  Right Upper Arm x 2: Blanching pink macules   Assessment & Plan  History of basal cell carcinoma (BCC) Left Chin  Clear. Observe for recurrence. Call clinic for new or changing lesions.  Recommend regular skin exams, daily broad-spectrum spf 30+ sunscreen use, and photoprotection.     Seborrheic keratosis Right Forearm, Intermammary chest  Benign, observe.  Recommend Eucerin Roughness Relief lotion or cream. Sample of lotion given to patient.    Telangiectasia Right Upper Arm x 2  Benign, observe.   Hemangiomas - Red papules - Discussed benign nature - Observe - Call for any changes  Return if symptoms worsen or fail to improve.  Graciella Belton, RMA, am acting as scribe for Brendolyn Patty, MD .  Documentation: I have reviewed the above documentation for accuracy and completeness, and I agree with the above.  Brendolyn Patty MD

## 2019-07-18 NOTE — Patient Instructions (Signed)

## 2019-08-08 ENCOUNTER — Ambulatory Visit: Payer: PPO

## 2019-08-08 ENCOUNTER — Other Ambulatory Visit: Payer: PPO

## 2019-08-08 ENCOUNTER — Ambulatory Visit: Payer: PPO | Admitting: Oncology

## 2019-08-12 NOTE — Progress Notes (Signed)
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  INTERVAL HISTORY: Patient returns to clinic today for further evaluation and consideration of cycle 10 of maintenance Orgivri.  She currently feels well and is asymptomatic.  She is tolerating her treatments without significant side effects. She is tolerating letrozole and Fosamax as well.  She has no neurologic complaints.  She denies any recent fevers or illnesses.  She has a good appetite and denies weight loss.  She has no chest pain, shortness of breath, cough, or hemoptysis.  She denies any nausea, vomiting, constipation, or diarrhea.  She has no urinary complaints.  Patient offers no specific complaints today.    REVIEW OF SYSTEMS:   Review of Systems  Constitutional: Negative.  Negative for fever, malaise/fatigue and weight loss.  Respiratory: Negative.  Negative for cough, hemoptysis and shortness of breath.   Cardiovascular: Negative.  Negative for chest pain and leg swelling.  Gastrointestinal: Negative.  Negative for abdominal pain.  Genitourinary: Negative.  Negative for dysuria.  Musculoskeletal: Negative.  Negative for back pain.  Skin: Negative.  Negative for rash.  Neurological: Negative.  Negative for dizziness, focal weakness, weakness and headaches.  Psychiatric/Behavioral: Negative.  The patient is not nervous/anxious.     As per HPI. Otherwise, a complete review of  systems is negative.  PAST MEDICAL HISTORY: Past Medical History:  Diagnosis Date  . Asthma   . Basal cell carcinoma 06/29/2016   L lat chin  . Cancer [facility]) 1999   Left breast pre cancerous- mastectomy with reconstruction  . Hypertension     PAST SURGICAL HISTORY: Past Surgical History:  Procedure Laterality Date  . AUGMENTATION MAMMAPLASTY Left 1999  . BREAST BIOPSY Right 11/30/2018   affirm stereo bx of calcs, x marker, path pending  . BREAST BIOPSY Right 11/30/2018   affirm bx of calcs, ribbon marker,path pending  . BREAST LUMPECTOMY WITH NEEDLE LOCALIZATION AND AXILLARY SENTINEL LYMPH NODE BX Right 12/16/2018   Procedure: BREAST LUMPECTOMY WITH NEEDLE LOCALIZATION AND AXILLARY SENTINEL LYMPH NODE BX;  Surgeon: Robert Bellow, MD;  Location: [facility] ORS;  Service: General;  Laterality: Right;  . KIDNEY CYST REMOVAL    . MASTECTOMY Left 1999   Implant  . REDUCTION MAMMAPLASTY Right 1997    FAMILY HISTORY: Family History  Problem Relation Age of Onset  . Breast cancer Neg Hx     ADVANCED DIRECTIVES (Y/N):  N  HEALTH MAINTENANCE: Social History   Tobacco Use  . Smoking status: Never Smoker  . Smokeless tobacco: Never Used  Vaping Use  . Vaping Use: Never used  Substance Use Topics  . Alcohol use: Never  . Drug use: Never     Colonoscopy:  PAP:  Bone density:  Lipid panel:  Allergies  Allergen Reactions  . Cephalexin Other (See Comments)    Possible headache     Current Outpatient Medications  Medication Sig Dispense Refill  . ADVAIR DISKUS 250-50 MCG/DOSE AEPB Inhale 1 puff into the lungs every 12 (  twelve) hours.    Marland Kitchen alendronate (FOSAMAX) 70 MG tablet Take 1 tablet (70 mg total) by mouth once a week. Take with a full glass of water on an empty stomach. 12 tablet 3  . Biotin (BIOTIN 5000) 5 MG CAPS Take 5 mg by mouth daily.    . Cholecalciferol 25 MCG (1000 UT) tablet Take 1,000 Units by mouth daily.    Marland Kitchen letrozole (FEMARA) 2.5 MG tablet Take 1  tablet (2.5 mg total) by mouth daily. 90 tablet 3  . lidocaine-prilocaine (EMLA) cream     . mometasone (NASONEX) 50 MCG/ACT nasal spray Place into the nose.    . Multiple Vitamins-Minerals (MULTIVITAL PLATINUM SILVER PO) Take 1 tablet by mouth daily.    . Multiple Vitamins-Minerals (PRESERVISION AREDS) CAPS Take 1 capsule by mouth 2 (two) times daily.     . potassium chloride (K-DUR,KLOR-CON) 10 MEQ tablet Take 10 mEq by mouth 2 (two) times daily.    Marland Kitchen PROAIR HFA 108 (90 Base) MCG/ACT inhaler Inhale 2 puffs into the lungs every 4 (four) hours as needed for wheezing.    Marland Kitchen spironolactone (ALDACTONE) 25 MG tablet Take 25 mg by mouth daily.    . trastuzumab-dkst 2 mg/kg in sodium chloride 0.9 % 250 mL Inject 2 mg/kg into the vein once.     No current facility-administered medications for this visit.    OBJECTIVE: Vitals:   08/15/19 0953  BP: (!) 151/83  Pulse: 83  Resp: 17  Temp: (!) 96.2 F (35.7 C)  SpO2: 96%     Body mass index is 21.82 kg/m.    ECOG FS:0 - Asymptomatic  General: Well-developed, well-nourished, no acute distress. Eyes: Pink conjunctiva, anicteric sclera. HEENT: Normocephalic, moist mucous membranes. Lungs: No audible wheezing or coughing. Heart: Regular rate and rhythm. Abdomen: Soft, nontender, no obvious distention. Musculoskeletal: No edema, cyanosis, or clubbing. Neuro: Alert, answering all questions appropriately. Cranial nerves grossly intact. Skin: No rashes or petechiae noted. Psych: Normal affect.   LAB RESULTS:  Lab Results  Component Value Date   NA 142 08/15/2019   K 4.0 08/15/2019   CL 105 08/15/2019   CO2 29 08/15/2019   GLUCOSE 94 08/15/2019   BUN 29 (H) 08/15/2019   CREATININE 0.76 08/15/2019   CALCIUM 9.3 08/15/2019   PROT 6.4 (L) 08/15/2019   ALBUMIN 4.1 08/15/2019   AST 18 08/15/2019   ALT 18 08/15/2019   ALKPHOS 48 08/15/2019   BILITOT 0.8 08/15/2019   GFRNONAA >60 08/15/2019   GFRAA >60 08/15/2019    Lab Results    Component Value Date   WBC 5.7 08/15/2019   NEUTROABS 4.0 08/15/2019   HGB 12.3 08/15/2019   HCT 37.4 08/15/2019   MCV 91.0 08/15/2019   PLT 246 08/15/2019     STUDIES: No results found.  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  PLAN:    1. 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  Although guidelines recommend reexcision given her close margins, the benefit of this is unclear given patient's advanced age and she ultimately chose not to undergo resection.  She completed adjuvant XRT.  She declined adjuvant treatment with chemotherapy, but wanted to pursue single agent Orgivri.  She expressed understanding that this  may not be as effective and her chance of recurrence increases.  MUGA scan on May 10, 2019 revealed an ejection fraction of 59.6% which is essentially unchanged from previous.  Repeat in the next 1 to 2 weeks.  Continue letrozole for a total of 5 years completing treatment in January 2026, but given her high risk disease, will consider extended treatment.  Proceed with cycle 10 of maintenance Orgivri today.  Return to clinic in 3 weeks for treatment only and then in 6 weeks for further evaluation and consideration of cycle 12. 2.  Osteoporosis: Baseline bone mineral density on March 21, 2019 revealed a T score of -2.9 which is considered osteoporosis.  Continue Fosamax, calcium, and vitamin D.  I spent a total of 30 minutes reviewing chart data, face-to-face evaluation with the patient, counseling and coordination of care as detailed above.   Patient expressed understanding and was in agreement with this plan. She also understands that She can call clinic at any time with  any questions, concerns, or complaints.   Cancer Staging Primary cancer of upper outer quadrant of right female breast [facility]) Staging form: Breast, AJCC 8th Edition - Clinical stage from 12/12/2018: Stage IB (cT2, cN0, cM0, G3, ER+, PR+, HER2+) - Signed by Lloyd Huger, MD on 01/11/2019   Lloyd Huger, MD   08/16/2019 6:26 AM

## 2019-08-14 ENCOUNTER — Encounter: Payer: Self-pay | Admitting: Oncology

## 2019-08-14 NOTE — Progress Notes (Signed)
Patient called pre assessment. Denies any pain or concerns at this time.

## 2019-08-15 ENCOUNTER — Other Ambulatory Visit: Payer: Self-pay

## 2019-08-15 ENCOUNTER — Inpatient Hospital Stay: Payer: PPO

## 2019-08-15 ENCOUNTER — Inpatient Hospital Stay: Payer: PPO | Attending: Oncology | Admitting: Oncology

## 2019-08-15 VITALS — BP 151/83 | HR 83 | Temp 96.2°F | Resp 17 | Wt 127.1 lb

## 2019-08-15 DIAGNOSIS — C50411 Malignant neoplasm of upper-outer quadrant of right female breast: Secondary | ICD-10-CM

## 2019-08-15 DIAGNOSIS — Z170 Estrogen receptor positive status [ER+]: Secondary | ICD-10-CM | POA: Diagnosis not present

## 2019-08-15 DIAGNOSIS — D0511 Intraductal carcinoma in situ of right breast: Secondary | ICD-10-CM | POA: Diagnosis not present

## 2019-08-15 DIAGNOSIS — Z5112 Encounter for antineoplastic immunotherapy: Secondary | ICD-10-CM | POA: Diagnosis not present

## 2019-08-15 DIAGNOSIS — C50911 Malignant neoplasm of unspecified site of right female breast: Secondary | ICD-10-CM

## 2019-08-15 LAB — CBC WITH DIFFERENTIAL/PLATELET
Abs Immature Granulocytes: 0.02 10*3/uL (ref 0.00–0.07)
Basophils Absolute: 0.1 10*3/uL (ref 0.0–0.1)
Basophils Relative: 1 %
Eosinophils Absolute: 0.2 10*3/uL (ref 0.0–0.5)
Eosinophils Relative: 4 %
HCT: 37.4 % (ref 36.0–46.0)
Hemoglobin: 12.3 g/dL (ref 12.0–15.0)
Immature Granulocytes: 0 %
Lymphocytes Relative: 16 %
Lymphs Abs: 0.9 10*3/uL (ref 0.7–4.0)
MCH: 29.9 pg (ref 26.0–34.0)
MCHC: 32.9 g/dL (ref 30.0–36.0)
MCV: 91 fL (ref 80.0–100.0)
Monocytes Absolute: 0.6 10*3/uL (ref 0.1–1.0)
Monocytes Relative: 10 %
Neutro Abs: 4 10*3/uL (ref 1.7–7.7)
Neutrophils Relative %: 69 %
Platelets: 246 10*3/uL (ref 150–400)
RBC: 4.11 MIL/uL (ref 3.87–5.11)
RDW: 14.1 % (ref 11.5–15.5)
WBC: 5.7 10*3/uL (ref 4.0–10.5)
nRBC: 0 % (ref 0.0–0.2)

## 2019-08-15 LAB — COMPREHENSIVE METABOLIC PANEL
ALT: 18 U/L (ref 0–44)
AST: 18 U/L (ref 15–41)
Albumin: 4.1 g/dL (ref 3.5–5.0)
Alkaline Phosphatase: 48 U/L (ref 38–126)
Anion gap: 8 (ref 5–15)
BUN: 29 mg/dL — ABNORMAL HIGH (ref 8–23)
CO2: 29 mmol/L (ref 22–32)
Calcium: 9.3 mg/dL (ref 8.9–10.3)
Chloride: 105 mmol/L (ref 98–111)
Creatinine, Ser: 0.76 mg/dL (ref 0.44–1.00)
GFR calc Af Amer: >60 mL/min (ref >60)
GFR calc non Af Amer: >60 mL/min (ref >60)
Glucose, Bld: 94 mg/dL (ref 70–99)
Potassium: 4 mmol/L (ref 3.5–5.1)
Sodium: 142 mmol/L (ref 135–145)
Total Bilirubin: 0.8 mg/dL (ref 0.3–1.2)
Total Protein: 6.4 g/dL — ABNORMAL LOW (ref 6.5–8.1)

## 2019-08-15 MED ORDER — TRASTUZUMAB-DKST CHEMO 150 MG IV SOLR
6.0000 mg/kg | Freq: Once | INTRAVENOUS | Status: AC
  Administered 2019-08-15: 357 mg via INTRAVENOUS
  Filled 2019-08-15: qty 17

## 2019-08-15 MED ORDER — SODIUM CHLORIDE 0.9 % IV SOLN
Freq: Once | INTRAVENOUS | Status: AC
  Filled 2019-08-15: qty 250

## 2019-08-15 NOTE — Progress Notes (Signed)
Last Dose ogivri on 5/18, Per MD keep 6mg /kg dosing.

## 2019-08-23 DIAGNOSIS — I129 Hypertensive chronic kidney disease with stage 1 through stage 4 chronic kidney disease, or unspecified chronic kidney disease: Secondary | ICD-10-CM | POA: Diagnosis not present

## 2019-08-23 DIAGNOSIS — E7800 Pure hypercholesterolemia, unspecified: Secondary | ICD-10-CM | POA: Diagnosis not present

## 2019-08-23 DIAGNOSIS — N1831 Chronic kidney disease, stage 3a: Secondary | ICD-10-CM | POA: Diagnosis not present

## 2019-08-24 ENCOUNTER — Other Ambulatory Visit: Payer: Self-pay

## 2019-08-24 ENCOUNTER — Ambulatory Visit
Admission: RE | Admit: 2019-08-24 | Discharge: 2019-08-24 | Disposition: A | Discharge status: Home / Self Care | Payer: PPO | Source: Ambulatory Visit | Attending: Oncology | Admitting: Oncology

## 2019-08-24 DIAGNOSIS — C50919 Malignant neoplasm of unspecified site of unspecified female breast: Secondary | ICD-10-CM | POA: Diagnosis not present

## 2019-08-24 DIAGNOSIS — Z01818 Encounter for other preprocedural examination: Secondary | ICD-10-CM | POA: Insufficient documentation

## 2019-08-24 DIAGNOSIS — C50411 Malignant neoplasm of upper-outer quadrant of right female breast: Secondary | ICD-10-CM | POA: Diagnosis not present

## 2019-08-24 MED ORDER — TECHNETIUM TC 99M-LABELED RED BLOOD CELLS IV KIT
20.0000 | PACK | Freq: Once | INTRAVENOUS | Status: AC | PRN
  Administered 2019-08-24: 20.5 via INTRAVENOUS

## 2019-08-28 ENCOUNTER — Ambulatory Visit
Admission: RE | Admit: 2019-08-28 | Discharge: 2019-08-28 | Disposition: A | Discharge status: Home / Self Care | Payer: PPO | Source: Ambulatory Visit | Attending: Radiation Oncology | Admitting: Radiation Oncology

## 2019-08-28 ENCOUNTER — Other Ambulatory Visit: Payer: Self-pay

## 2019-08-28 ENCOUNTER — Encounter: Payer: Self-pay | Admitting: Radiation Oncology

## 2019-08-28 VITALS — BP 166/89 | HR 73 | Temp 96.6°F | Resp 16 | Wt 128.2 lb

## 2019-08-28 DIAGNOSIS — C50411 Malignant neoplasm of upper-outer quadrant of right female breast: Secondary | ICD-10-CM | POA: Diagnosis not present

## 2019-08-28 DIAGNOSIS — Z79811 Long term (current) use of aromatase inhibitors: Secondary | ICD-10-CM | POA: Diagnosis not present

## 2019-08-28 DIAGNOSIS — Z170 Estrogen receptor positive status [ER+]: Secondary | ICD-10-CM | POA: Diagnosis not present

## 2019-08-28 DIAGNOSIS — Z923 Personal history of irradiation: Secondary | ICD-10-CM | POA: Diagnosis not present

## 2019-08-28 NOTE — Progress Notes (Signed)
Radiation Oncology Follow up Note  Name: Hayley Nolan   Date:   08/28/2019 MRN:  [id number] DOB: 1927-05-01    This 86 y.o. female presents to the clinic today for 64-month follow-up status post whole breast radiation to her right breast status post wide local excision for stage Ia ER/PR positive invasive mammary carcinoma.  REFERRING PROVIDER: Kirk Ruths, MD  HPI: Patient is a 86 year old female now at 6 months having completed whole breast radiation to right breast for stage I ER/PR positive invasive mammary carcinoma.  Her margin for DCIS was close we boosted that area.  She is seen today in routine follow-up and is doing well.  She specifically denies breast tenderness cough or bone pain..  She has not had follow-up mammograms at this time.  She is currently on Femara tolerate it well without side effect.  COMPLICATIONS OF TREATMENT: none  FOLLOW UP COMPLIANCE: keeps appointments   PHYSICAL EXAM:  BP (!) 166/89 (BP Location: Left Arm, Patient Position: Sitting)   Pulse 73   Temp (!) 96.6 F (35.9 C) (Tympanic)   Resp 16   Wt 128 lb 3.2 oz (58.2 kg)   BMI 22.01 kg/m  Lungs are clear to A&P cardiac examination essentially unremarkable with regular rate and rhythm. No dominant mass or nodularity is noted in either breast in 2 positions examined. Incision is well-healed. No axillary or supraclavicular adenopathy is appreciated. Cosmetic result is excellent.  Well-developed well-nourished patient in NAD. HEENT reveals PERLA, EOMI, discs not visualized.  Oral cavity is clear. No oral mucosal lesions are identified. Neck is clear without evidence of cervical or supraclavicular adenopathy. Lungs are clear to A&P. Cardiac examination is essentially unremarkable with regular rate and rhythm without murmur rub or thrill. Abdomen is benign with no organomegaly or masses noted. Motor sensory and DTR levels are equal and symmetric in the upper and lower extremities. Cranial nerves II  through XII are grossly intact. Proprioception is intact. No peripheral adenopathy or edema is identified. No motor or sensory levels are noted. Crude visual fields are within normal range.  RADIOLOGY RESULTS: No current films to review  PLAN: Present time patient is doing well with no evidence of disease.  And pleased with her overall progress.  I have asked to see her back in 1 year for follow-up.  She continues close follow-up care with her surgeon Dr. Tollie Pizza who will be ordering her follow-up mammograms.  Otherwise and pleased with her overall progress.  Patient knows to call with any concerns.  I would like to take this opportunity to thank you for allowing me to participate in the care of your patient.Noreene Filbert, MD

## 2019-08-29 ENCOUNTER — Ambulatory Visit: Payer: PPO | Admitting: Dermatology

## 2019-08-30 DIAGNOSIS — I129 Hypertensive chronic kidney disease with stage 1 through stage 4 chronic kidney disease, or unspecified chronic kidney disease: Secondary | ICD-10-CM | POA: Diagnosis not present

## 2019-08-30 DIAGNOSIS — E7800 Pure hypercholesterolemia, unspecified: Secondary | ICD-10-CM | POA: Diagnosis not present

## 2019-08-30 DIAGNOSIS — N1831 Chronic kidney disease, stage 3a: Secondary | ICD-10-CM | POA: Diagnosis not present

## 2019-08-30 DIAGNOSIS — E876 Hypokalemia: Secondary | ICD-10-CM | POA: Diagnosis not present

## 2019-08-30 DIAGNOSIS — E890 Postprocedural hypothyroidism: Secondary | ICD-10-CM | POA: Diagnosis not present

## 2019-08-30 DIAGNOSIS — K219 Gastro-esophageal reflux disease without esophagitis: Secondary | ICD-10-CM | POA: Diagnosis not present

## 2019-09-05 ENCOUNTER — Inpatient Hospital Stay: Payer: PPO | Attending: Oncology

## 2019-09-05 ENCOUNTER — Other Ambulatory Visit: Payer: Self-pay

## 2019-09-05 VITALS — BP 172/82 | HR 80 | Temp 98.2°F | Resp 20 | Wt 128.0 lb

## 2019-09-05 DIAGNOSIS — Z5112 Encounter for antineoplastic immunotherapy: Secondary | ICD-10-CM | POA: Insufficient documentation

## 2019-09-05 DIAGNOSIS — C50411 Malignant neoplasm of upper-outer quadrant of right female breast: Secondary | ICD-10-CM | POA: Insufficient documentation

## 2019-09-05 DIAGNOSIS — D0511 Intraductal carcinoma in situ of right breast: Secondary | ICD-10-CM | POA: Insufficient documentation

## 2019-09-05 MED ORDER — SODIUM CHLORIDE 0.9 % IV SOLN
Freq: Once | INTRAVENOUS | Status: AC
  Filled 2019-09-05: qty 250

## 2019-09-05 MED ORDER — TRASTUZUMAB-DKST CHEMO 150 MG IV SOLR
6.0000 mg/kg | Freq: Once | INTRAVENOUS | Status: AC
  Administered 2019-09-05: 357 mg via INTRAVENOUS
  Filled 2019-09-05: qty 17

## 2019-09-13 ENCOUNTER — Other Ambulatory Visit: Payer: Self-pay

## 2019-09-13 ENCOUNTER — Ambulatory Visit: Payer: PPO | Admitting: Dermatology

## 2019-09-13 DIAGNOSIS — I781 Nevus, non-neoplastic: Secondary | ICD-10-CM

## 2019-09-13 DIAGNOSIS — D1800 Hemangioma unspecified site: Secondary | ICD-10-CM

## 2019-09-13 DIAGNOSIS — L821 Other seborrheic keratosis: Secondary | ICD-10-CM

## 2019-09-13 DIAGNOSIS — L578 Other skin changes due to chronic exposure to nonionizing radiation: Secondary | ICD-10-CM | POA: Diagnosis not present

## 2019-09-13 NOTE — Progress Notes (Signed)
   Follow-Up Visit   Subjective  Hayley Nolan is a 86 y.o. female who presents for the following: lesions (on the right upper arm and L abdomen - patient is concerned and would like them checked). Patient has no symptoms other than irregular appearing.  The following portions of the chart were reviewed this encounter and updated as appropriate:  Tobacco  Allergies  Meds  Problems  Med Hx  Surg Hx  Fam Hx     Review of Systems:  No other skin or systemic complaints except as noted in HPI or Assessment and Plan.  Objective  Well appearing patient in no apparent distress; mood and affect are within normal limits.  A focused examination was performed including the abdomen and arms. Relevant physical exam findings are noted in the Assessment and Plan.  Objective  R upper arm: 0.8 cm blanching pink macule  Objective  R inf cheek: Stuck-on, waxy, tan-brown papule or plaque --Discussed benign etiology and prognosis.   Assessment & Plan    Telangiectasia R upper arm Benign-appearing.  Observation.  Call clinic for new or changing moles.  Recommend daily use of broad spectrum spf 30+ sunscreen to sun-exposed areas.   Seborrheic keratosis R inf cheek Advised patient that treating asymptomatic SK is considered a cosmetic procedure and is not covered by health insurance. Discussed cost of $60 for first lesion and $15 for each one there after.  Destruction of lesion - R inf cheek Complexity: simple   Destruction method: cryotherapy   Informed consent: discussed and consent obtained   Timeout:  patient name, date of birth, surgical site, and procedure verified Lesion destroyed using liquid nitrogen: Yes   Region frozen until ice ball extended beyond lesion: Yes   Outcome: patient tolerated procedure well with no complications   Post-procedure details: wound care instructions given     Seborrheic Keratoses - Stuck-on, waxy, tan-brown papules and plaques  - Discussed benign  etiology and prognosis. - Observe - Call for any changes  Actinic Damage - diffuse scaly erythematous macules with underlying dyspigmentation - Recommend daily broad spectrum sunscreen SPF 30+ to sun-exposed areas, reapply every 2 hours as needed.  - Call for new or changing lesions.  Hemangiomas - Red papules - Discussed benign nature - Observe - Call for any changes   Return if symptoms worsen or fail to improve.  Luther Redo, CMA, am acting as scribe for Sarina Ser, MD .  Documentation: I have reviewed the above documentation for accuracy and completeness, and I agree with the above.  Sarina Ser, MD

## 2019-09-17 ENCOUNTER — Encounter: Payer: Self-pay | Admitting: Dermatology

## 2019-09-23 NOTE — Progress Notes (Signed)
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  INTERVAL HISTORY: Patient returns to clinic today for further evaluation and consideration of cycle 12 of maintenance Orgivri.  She continues to feel well and remains asymptomatic.  She is tolerating her treatments without significant side effects.  She also continues to tolerate letrozole and Fosamax as well.  She has no neurologic complaints.  She denies any recent fevers or illnesses.  She has a good appetite and denies weight loss.  She has no chest pain, shortness of breath, cough, or hemoptysis.  She denies any nausea, vomiting, constipation, or diarrhea.  She has no urinary complaints.  Patient offers no specific complaints today.  REVIEW OF SYSTEMS:   Review of Systems  Constitutional: Negative.  Negative for fever, malaise/fatigue and weight loss.  Respiratory: Negative.  Negative for cough, hemoptysis and shortness of breath.   Cardiovascular: Negative.  Negative for chest pain and leg swelling.  Gastrointestinal: Negative.  Negative for abdominal pain.  Genitourinary: Negative.  Negative for dysuria.  Musculoskeletal: Negative.  Negative for back pain.  Skin: Negative.  Negative for rash.  Neurological: Negative.  Negative for dizziness, focal weakness, weakness and headaches.  Psychiatric/Behavioral: Negative.  The patient is not nervous/anxious.     As per HPI. Otherwise, a  complete review of systems is negative.  PAST MEDICAL HISTORY: Past Medical History:  Diagnosis Date   Asthma    Basal cell carcinoma 06/29/2016   L lat chin   Cancer ([place]) 1999   Left breast pre cancerous- mastectomy with reconstruction   Hypertension     PAST SURGICAL HISTORY: Past Surgical History:  Procedure Laterality Date   AUGMENTATION MAMMAPLASTY Left 1999   BREAST BIOPSY Right 11/30/2018   affirm stereo bx of calcs, x marker, path pending   BREAST BIOPSY Right 11/30/2018   affirm bx of calcs, ribbon marker,path pending   BREAST LUMPECTOMY WITH NEEDLE LOCALIZATION AND AXILLARY SENTINEL LYMPH NODE BX Right 12/16/2018   Procedure: BREAST LUMPECTOMY WITH NEEDLE LOCALIZATION AND AXILLARY SENTINEL LYMPH NODE BX;  Surgeon: Robert Bellow, MD;  Location: [facility] ORS;  Service: General;  Laterality: Right;   KIDNEY CYST REMOVAL     MASTECTOMY Left 1999   Implant   REDUCTION MAMMAPLASTY Right 1997    FAMILY HISTORY: Family History  Problem Relation Age of Onset   Breast cancer Neg Hx     ADVANCED DIRECTIVES (Y/N):  N  HEALTH MAINTENANCE: Social History   Tobacco Use   Smoking status: Never Smoker   Smokeless tobacco: Never Used  Vaping Use   Vaping Use: Never used  Substance Use Topics   Alcohol use: Never   Drug use: Never     Colonoscopy:  PAP:  Bone density:  Lipid panel:  Allergies  Allergen Reactions   Cephalexin Other (See Comments)    Possible headache     Current Outpatient Medications  Medication Sig Dispense Refill   ADVAIR DISKUS 250-50 MCG/DOSE AEPB Inhale 1 puff into the lungs  every 12 (twelve) hours.     alendronate (FOSAMAX) 70 MG tablet Take 1 tablet (70 mg total) by mouth once a week. Take with a full glass of water on an empty stomach. 12 tablet 3   Biotin (BIOTIN 5000) 5 MG CAPS Take 5 mg by mouth daily.     Cholecalciferol 25 MCG (1000 UT) tablet Take 1,000 Units by mouth daily.     letrozole (FEMARA) 2.5 MG  tablet Take 1 tablet (2.5 mg total) by mouth daily. 90 tablet 3   mometasone (NASONEX) 50 MCG/ACT nasal spray Place into the nose.     Multiple Vitamins-Minerals (MULTIVITAL PLATINUM SILVER PO) Take 1 tablet by mouth daily.     Multiple Vitamins-Minerals (PRESERVISION AREDS) CAPS Take 1 capsule by mouth 2 (two) times daily.      potassium chloride (K-DUR,KLOR-CON) 10 MEQ tablet Take 10 mEq by mouth 2 (two) times daily.     PROAIR HFA 108 (90 Base) MCG/ACT inhaler Inhale 2 puffs into the lungs every 4 (four) hours as needed for wheezing.     spironolactone (ALDACTONE) 25 MG tablet Take 25 mg by mouth daily.     trastuzumab-dkst 2 mg/kg in sodium chloride 0.9 % 250 mL Inject 2 mg/kg into the vein once.     No current facility-administered medications for this visit.    OBJECTIVE: Vitals:   09/26/19 1016  BP: (!) 174/84  Pulse: 74  Resp: 16  Temp: 98.6 F (37 C)  SpO2: 98%     Body mass index is 21.95 kg/m.    ECOG FS:0 - Asymptomatic  General: Well-developed, well-nourished, no acute distress. Eyes: Pink conjunctiva, anicteric sclera. HEENT: Normocephalic, moist mucous membranes. Lungs: No audible wheezing or coughing. Heart: Regular rate and rhythm. Abdomen: Soft, nontender, no obvious distention. Musculoskeletal: No edema, cyanosis, or clubbing. Neuro: Alert, answering all questions appropriately. Cranial nerves grossly intact. Skin: No rashes or petechiae noted. Psych: Normal affect.   LAB RESULTS:  Lab Results  Component Value Date   NA 142 09/26/2019   K 4.1 09/26/2019   CL 106 09/26/2019   CO2 28 09/26/2019   GLUCOSE 93 09/26/2019   BUN 27 (H) 09/26/2019   CREATININE 0.81 09/26/2019   CALCIUM 9.4 09/26/2019   PROT 6.1 (L) 09/26/2019   ALBUMIN 4.3 09/26/2019   AST 19 09/26/2019   ALT 20 09/26/2019   ALKPHOS 43 09/26/2019   BILITOT 0.8 09/26/2019   GFRNONAA >60 09/26/2019   GFRAA >60 09/26/2019    Lab Results  Component Value Date   WBC 8.6  09/26/2019   NEUTROABS 6.7 09/26/2019   HGB 12.7 09/26/2019   HCT 38.2 09/26/2019   MCV 89.9 09/26/2019   PLT 230 09/26/2019     STUDIES: No results found.  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  PLAN:    1. 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  Although guidelines recommend reexcision given her close margins, the benefit of this is unclear given patient's advanced age and she ultimately chose not to undergo resection.  She completed adjuvant XRT.  She declined adjuvant treatment with chemotherapy, but wanted to pursue single agent Orgivri.  She expressed understanding that this may not be as effective and her chance  of recurrence increases.  Her most recent MUGA scan on August 24, 2019 had a slightly improved ejection fraction of 63.9%.  Continue letrozole for a total of 5 years completing treatment in January 2026, but given her high risk disease, will consider extended treatment.  Proceed with cycle 12 of maintenance Orgivri today.  Return to clinic in 3 weeks for treatment only and then in 6 weeks for further evaluation and consideration of cycle 14. 2.  Osteoporosis: Baseline bone mineral density on March 21, 2019 revealed a T score of -2.9 which is considered osteoporosis.  Continue Fosamax, calcium, and vitamin D.  I spent a total of 30 minutes reviewing chart data, face-to-face evaluation with the patient, counseling and coordination of care as detailed above.   Patient expressed understanding and was in agreement with this plan. She also understands that She can call clinic at any time with any questions, concerns, or complaints.   Cancer Staging Primary cancer of upper  outer quadrant of right female breast [facility]) Staging form: Breast, AJCC 8th Edition - Clinical stage from 12/12/2018: Stage IB (cT2, cN0, cM0, G3, ER+, PR+, HER2+) - Signed by Lloyd Huger, MD on 01/11/2019   Lloyd Huger, MD   09/27/2019 3:36 PM

## 2019-09-26 ENCOUNTER — Other Ambulatory Visit: Payer: Self-pay

## 2019-09-26 ENCOUNTER — Inpatient Hospital Stay: Payer: PPO

## 2019-09-26 ENCOUNTER — Inpatient Hospital Stay (HOSPITAL_BASED_OUTPATIENT_CLINIC_OR_DEPARTMENT_OTHER): Payer: PPO | Admitting: Oncology

## 2019-09-26 ENCOUNTER — Encounter: Payer: Self-pay | Admitting: Oncology

## 2019-09-26 VITALS — BP 174/84 | HR 74 | Temp 98.6°F | Resp 16 | Wt 127.9 lb

## 2019-09-26 DIAGNOSIS — C50411 Malignant neoplasm of upper-outer quadrant of right female breast: Secondary | ICD-10-CM | POA: Diagnosis not present

## 2019-09-26 DIAGNOSIS — Z5112 Encounter for antineoplastic immunotherapy: Secondary | ICD-10-CM | POA: Diagnosis not present

## 2019-09-26 DIAGNOSIS — C50911 Malignant neoplasm of unspecified site of right female breast: Secondary | ICD-10-CM

## 2019-09-26 LAB — CBC WITH DIFFERENTIAL/PLATELET
Abs Immature Granulocytes: 0.03 10*3/uL (ref 0.00–0.07)
Basophils Absolute: 0 10*3/uL (ref 0.0–0.1)
Basophils Relative: 1 %
Eosinophils Absolute: 0.2 10*3/uL (ref 0.0–0.5)
Eosinophils Relative: 2 %
HCT: 38.2 % (ref 36.0–46.0)
Hemoglobin: 12.7 g/dL (ref 12.0–15.0)
Immature Granulocytes: 0 %
Lymphocytes Relative: 12 %
Lymphs Abs: 1 10*3/uL (ref 0.7–4.0)
MCH: 29.9 pg (ref 26.0–34.0)
MCHC: 33.2 g/dL (ref 30.0–36.0)
MCV: 89.9 fL (ref 80.0–100.0)
Monocytes Absolute: 0.7 10*3/uL (ref 0.1–1.0)
Monocytes Relative: 8 %
Neutro Abs: 6.7 10*3/uL (ref 1.7–7.7)
Neutrophils Relative %: 77 %
Platelets: 230 10*3/uL (ref 150–400)
RBC: 4.25 MIL/uL (ref 3.87–5.11)
RDW: 13.9 % (ref 11.5–15.5)
WBC: 8.6 10*3/uL (ref 4.0–10.5)
nRBC: 0 % (ref 0.0–0.2)

## 2019-09-26 LAB — COMPREHENSIVE METABOLIC PANEL
ALT: 20 U/L (ref 0–44)
AST: 19 U/L (ref 15–41)
Albumin: 4.3 g/dL (ref 3.5–5.0)
Alkaline Phosphatase: 43 U/L (ref 38–126)
Anion gap: 8 (ref 5–15)
BUN: 27 mg/dL — ABNORMAL HIGH (ref 8–23)
CO2: 28 mmol/L (ref 22–32)
Calcium: 9.4 mg/dL (ref 8.9–10.3)
Chloride: 106 mmol/L (ref 98–111)
Creatinine, Ser: 0.81 mg/dL (ref 0.44–1.00)
GFR calc Af Amer: >60 mL/min (ref >60)
GFR calc non Af Amer: >60 mL/min (ref >60)
Glucose, Bld: 93 mg/dL (ref 70–99)
Potassium: 4.1 mmol/L (ref 3.5–5.1)
Sodium: 142 mmol/L (ref 135–145)
Total Bilirubin: 0.8 mg/dL (ref 0.3–1.2)
Total Protein: 6.1 g/dL — ABNORMAL LOW (ref 6.5–8.1)

## 2019-09-26 MED ORDER — SODIUM CHLORIDE 0.9 % IV SOLN
Freq: Once | INTRAVENOUS | Status: AC
  Filled 2019-09-26: qty 250

## 2019-09-26 MED ORDER — TRASTUZUMAB-DKST CHEMO 150 MG IV SOLR
6.0000 mg/kg | Freq: Once | INTRAVENOUS | Status: AC
  Administered 2019-09-26: 357 mg via INTRAVENOUS
  Filled 2019-09-26: qty 17

## 2019-09-26 NOTE — Progress Notes (Signed)
Patient denies any concerns today.  

## 2019-10-17 ENCOUNTER — Inpatient Hospital Stay: Payer: PPO | Attending: Oncology

## 2019-10-17 ENCOUNTER — Other Ambulatory Visit: Payer: Self-pay

## 2019-10-17 VITALS — BP 140/81 | HR 77 | Temp 97.0°F

## 2019-10-17 DIAGNOSIS — D0511 Intraductal carcinoma in situ of right breast: Secondary | ICD-10-CM | POA: Insufficient documentation

## 2019-10-17 DIAGNOSIS — C50411 Malignant neoplasm of upper-outer quadrant of right female breast: Secondary | ICD-10-CM | POA: Diagnosis not present

## 2019-10-17 DIAGNOSIS — Z5112 Encounter for antineoplastic immunotherapy: Secondary | ICD-10-CM | POA: Diagnosis not present

## 2019-10-17 MED ORDER — TRASTUZUMAB-DKST CHEMO 150 MG IV SOLR
6.0000 mg/kg | Freq: Once | INTRAVENOUS | Status: AC
  Administered 2019-10-17: 357 mg via INTRAVENOUS
  Filled 2019-10-17: qty 17

## 2019-10-17 MED ORDER — SODIUM CHLORIDE 0.9 % IV SOLN
Freq: Once | INTRAVENOUS | Status: AC
  Filled 2019-10-17: qty 250

## 2019-11-03 ENCOUNTER — Encounter: Payer: Self-pay | Admitting: Oncology

## 2019-11-03 NOTE — Progress Notes (Signed)
Patient denies any questions or concerns.

## 2019-11-04 NOTE — Progress Notes (Signed)
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  INTERVAL HISTORY: Patient returns to clinic today for further evaluation and consideration of cycle 14 of maintenance Orgivri.  She continues to feel well and remains asymptomatic.  She is tolerating her treatments without significant side effects. She also continues to tolerate letrozole and Fosamax as well.  She has no neurologic complaints.  She denies any recent fevers or illnesses.  She has a good appetite and denies weight loss.  She has no chest pain, shortness of breath, cough, or hemoptysis.  She denies any nausea, vomiting, constipation, or diarrhea.  She has no urinary complaints.  Patient feels at her baseline offers no specific complaints today.  REVIEW OF SYSTEMS:   Review of Systems  Constitutional: Negative.  Negative for fever, malaise/fatigue and weight loss.  Respiratory: Negative.  Negative for cough, hemoptysis and shortness of breath.   Cardiovascular: Negative.  Negative for chest pain and leg swelling.  Gastrointestinal: Negative.  Negative for abdominal pain.  Genitourinary: Negative.  Negative for dysuria.  Musculoskeletal: Negative.  Negative for back pain.  Skin: Negative.  Negative for rash.  Neurological: Negative.  Negative for dizziness, focal weakness, weakness and headaches.  Psychiatric/Behavioral: Negative.  The patient is not nervous/anxious.     As  per HPI. Otherwise, a complete review of systems is negative.  PAST MEDICAL HISTORY: Past Medical History:  Diagnosis Date  . Asthma   . Basal cell carcinoma 06/29/2016   L lat chin  . Cancer [facility]) 1999   Left breast pre cancerous- mastectomy with reconstruction  . Hypertension     PAST SURGICAL HISTORY: Past Surgical History:  Procedure Laterality Date  . AUGMENTATION MAMMAPLASTY Left 1999  . BREAST BIOPSY Right 11/30/2018   affirm stereo bx of calcs, x marker, path pending  . BREAST BIOPSY Right 11/30/2018   affirm bx of calcs, ribbon marker,path pending  . BREAST LUMPECTOMY WITH NEEDLE LOCALIZATION AND AXILLARY SENTINEL LYMPH NODE BX Right 12/16/2018   Procedure: BREAST LUMPECTOMY WITH NEEDLE LOCALIZATION AND AXILLARY SENTINEL LYMPH NODE BX;  Surgeon: Robert Bellow, MD;  Location: [facility] ORS;  Service: General;  Laterality: Right;  . KIDNEY CYST REMOVAL    . MASTECTOMY Left 1999   Implant  . REDUCTION MAMMAPLASTY Right 1997    FAMILY HISTORY: Family History  Problem Relation Age of Onset  . Breast cancer Neg Hx     ADVANCED DIRECTIVES (Y/N):  N  HEALTH MAINTENANCE: Social History   Tobacco Use  . Smoking status: Never Smoker  . Smokeless tobacco: Never Used  Vaping Use  . Vaping Use: Never used  Substance Use Topics  . Alcohol use: Never  . Drug use: Never     Colonoscopy:  PAP:  Bone density:  Lipid panel:  Allergies  Allergen Reactions  . Cephalexin Other (See Comments)    Possible headache     Current Outpatient Medications  Medication Sig Dispense Refill  . ADVAIR DISKUS 250-50 MCG/DOSE AEPB Inhale 1 puff  into the lungs every 12 (twelve) hours.    Marland Kitchen alendronate (FOSAMAX) 70 MG tablet Take 1 tablet (70 mg total) by mouth once a week. Take with a full glass of water on an empty stomach. 12 tablet 3  . Biotin (BIOTIN 5000) 5 MG CAPS Take 5 mg by mouth daily.    . Cholecalciferol 25 MCG (1000 UT) tablet Take 1,000 Units by mouth daily.    Marland Kitchen  letrozole (FEMARA) 2.5 MG tablet Take 1 tablet (2.5 mg total) by mouth daily. 90 tablet 3  . mometasone (NASONEX) 50 MCG/ACT nasal spray Place into the nose.    . Multiple Vitamins-Minerals (MULTIVITAL PLATINUM SILVER PO) Take 1 tablet by mouth daily.    . Multiple Vitamins-Minerals (PRESERVISION AREDS) CAPS Take 1 capsule by mouth 2 (two) times daily.     . potassium chloride (K-DUR,KLOR-CON) 10 MEQ tablet Take 10 mEq by mouth 2 (two) times daily.    Marland Kitchen PROAIR HFA 108 (90 Base) MCG/ACT inhaler Inhale 2 puffs into the lungs every 4 (four) hours as needed for wheezing.    Marland Kitchen spironolactone (ALDACTONE) 25 MG tablet Take 25 mg by mouth daily.    . trastuzumab-dkst 2 mg/kg in sodium chloride 0.9 % 250 mL Inject 2 mg/kg into the vein once.     No current facility-administered medications for this visit.   Facility-Administered Medications Ordered in Other Visits  Medication Dose Route Frequency Provider Last Rate Last Admin  . trastuzumab-dkst (OGIVRI) 357 mg in sodium chloride 0.9 % 250 mL chemo infusion  6 mg/kg (Treatment Plan Recorded) Intravenous Once Lloyd Huger, MD 534 mL/hr at 11/07/19 1131 357 mg at 11/07/19 1131    OBJECTIVE: Vitals:   11/07/19 1051  BP: (!) 174/82  Pulse: 74  Temp: (!) 96.1 F (35.6 C)  SpO2: 98%     Body mass index is 22.01 kg/m.    ECOG FS:0 - Asymptomatic  General: Well-developed, well-nourished, no acute distress. Eyes: Pink conjunctiva, anicteric sclera. HEENT: Normocephalic, moist mucous membranes. Lungs: No audible wheezing or coughing. Heart: Regular rate and rhythm. Abdomen: Soft, nontender, no obvious distention. Musculoskeletal: No edema, cyanosis, or clubbing. Neuro: Alert, answering all questions appropriately. Cranial nerves grossly intact. Skin: No rashes or petechiae noted. Psych: Normal affect.  LAB RESULTS:  Lab Results  Component Value Date   NA 140 11/07/2019   K 3.9 11/07/2019   CL 104 11/07/2019   CO2 28 11/07/2019    GLUCOSE 90 11/07/2019   BUN 28 (H) 11/07/2019   CREATININE 0.88 11/07/2019   CALCIUM 9.1 11/07/2019   PROT 6.3 (L) 11/07/2019   ALBUMIN 4.2 11/07/2019   AST 20 11/07/2019   ALT 21 11/07/2019   ALKPHOS 46 11/07/2019   BILITOT 0.9 11/07/2019   GFRNONAA 58 (L) 11/07/2019   GFRAA >60 11/07/2019    Lab Results  Component Value Date   WBC 6.9 11/07/2019   NEUTROABS 5.0 11/07/2019   HGB 13.6 11/07/2019   HCT 40.7 11/07/2019   MCV 87.2 11/07/2019   PLT 256 11/07/2019     STUDIES: No results found.  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  PLAN:    1. 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  Although guidelines recommend  reexcision given her close margins, the benefit of this is unclear given patient's advanced age and she ultimately chose not to undergo resection.  She completed adjuvant XRT.  She declined adjuvant treatment with chemotherapy, but wanted to pursue single agent Orgivri.  She expressed understanding that this may not be as effective and her chance of recurrence increases.  Her most recent MUGA scan on August 24, 2019 had a slightly improved ejection fraction of 63.9%.  Repeat MUGA in the next 1 to 2 weeks prior to her next treatment.  Continue letrozole for a total of 5 years completing treatment in January 2026, but given her high risk disease, will consider extended treatment.  Proceed with cycle 14 of maintenance Orgivri today.  Return to clinic in 3 weeks for treatment only and then in 6 weeks for further evaluation and consideration of cycle 16. 2.  Osteoporosis: Baseline bone mineral density on March 21, 2019 revealed a T score of -2.9 which is considered  osteoporosis.  Continue Fosamax, calcium, and vitamin D.  Repeat in January 2022. 3.  Hypertension: Patient's blood pressure is moderately elevated today.  Continue follow-up and treatment per primary care.  I spent a total of 30 minutes reviewing chart data, face-to-face evaluation with the patient, counseling and coordination of care as detailed above.   Patient expressed understanding and was in agreement with this plan. She also understands that She can call clinic at any time with any questions, concerns, or complaints.   Cancer Staging Primary cancer of upper outer quadrant of right female breast [facility]) Staging form: Breast, AJCC 8th Edition - Clinical stage from 12/12/2018: Stage IB (cT2, cN0, cM0, G3, ER+, PR+, HER2+) - Signed by Lloyd Huger, MD on 01/11/2019   Lloyd Huger, MD   11/07/2019 11:40 AM

## 2019-11-07 ENCOUNTER — Inpatient Hospital Stay: Payer: PPO

## 2019-11-07 ENCOUNTER — Other Ambulatory Visit: Payer: Self-pay | Admitting: General Surgery

## 2019-11-07 ENCOUNTER — Inpatient Hospital Stay (HOSPITAL_BASED_OUTPATIENT_CLINIC_OR_DEPARTMENT_OTHER): Payer: PPO | Admitting: Oncology

## 2019-11-07 ENCOUNTER — Other Ambulatory Visit: Payer: Self-pay

## 2019-11-07 ENCOUNTER — Inpatient Hospital Stay: Payer: PPO | Attending: Oncology

## 2019-11-07 VITALS — BP 174/82 | HR 74 | Temp 96.1°F | Wt 128.2 lb

## 2019-11-07 VITALS — BP 142/83 | HR 65

## 2019-11-07 DIAGNOSIS — Z5112 Encounter for antineoplastic immunotherapy: Secondary | ICD-10-CM | POA: Diagnosis not present

## 2019-11-07 DIAGNOSIS — C50411 Malignant neoplasm of upper-outer quadrant of right female breast: Secondary | ICD-10-CM

## 2019-11-07 DIAGNOSIS — C50911 Malignant neoplasm of unspecified site of right female breast: Secondary | ICD-10-CM

## 2019-11-07 DIAGNOSIS — Z923 Personal history of irradiation: Secondary | ICD-10-CM | POA: Diagnosis not present

## 2019-11-07 DIAGNOSIS — D0511 Intraductal carcinoma in situ of right breast: Secondary | ICD-10-CM | POA: Insufficient documentation

## 2019-11-07 DIAGNOSIS — Z170 Estrogen receptor positive status [ER+]: Secondary | ICD-10-CM | POA: Insufficient documentation

## 2019-11-07 LAB — COMPREHENSIVE METABOLIC PANEL
ALT: 21 U/L (ref 0–44)
AST: 20 U/L (ref 15–41)
Albumin: 4.2 g/dL (ref 3.5–5.0)
Alkaline Phosphatase: 46 U/L (ref 38–126)
Anion gap: 8 (ref 5–15)
BUN: 28 mg/dL — ABNORMAL HIGH (ref 8–23)
CO2: 28 mmol/L (ref 22–32)
Calcium: 9.1 mg/dL (ref 8.9–10.3)
Chloride: 104 mmol/L (ref 98–111)
Creatinine, Ser: 0.88 mg/dL (ref 0.44–1.00)
GFR calc Af Amer: >60 mL/min (ref >60)
GFR calc non Af Amer: 58 mL/min — ABNORMAL LOW (ref >60)
Glucose, Bld: 90 mg/dL (ref 70–99)
Potassium: 3.9 mmol/L (ref 3.5–5.1)
Sodium: 140 mmol/L (ref 135–145)
Total Bilirubin: 0.9 mg/dL (ref 0.3–1.2)
Total Protein: 6.3 g/dL — ABNORMAL LOW (ref 6.5–8.1)

## 2019-11-07 LAB — CBC WITH DIFFERENTIAL/PLATELET
Abs Immature Granulocytes: 0.02 10*3/uL (ref 0.00–0.07)
Basophils Absolute: 0.1 10*3/uL (ref 0.0–0.1)
Basophils Relative: 1 %
Eosinophils Absolute: 0.2 10*3/uL (ref 0.0–0.5)
Eosinophils Relative: 3 %
HCT: 40.7 % (ref 36.0–46.0)
Hemoglobin: 13.6 g/dL (ref 12.0–15.0)
Immature Granulocytes: 0 %
Lymphocytes Relative: 15 %
Lymphs Abs: 1.1 10*3/uL (ref 0.7–4.0)
MCH: 29.1 pg (ref 26.0–34.0)
MCHC: 33.4 g/dL (ref 30.0–36.0)
MCV: 87.2 fL (ref 80.0–100.0)
Monocytes Absolute: 0.6 10*3/uL (ref 0.1–1.0)
Monocytes Relative: 8 %
Neutro Abs: 5 10*3/uL (ref 1.7–7.7)
Neutrophils Relative %: 73 %
Platelets: 256 10*3/uL (ref 150–400)
RBC: 4.67 MIL/uL (ref 3.87–5.11)
RDW: 14.3 % (ref 11.5–15.5)
WBC: 6.9 10*3/uL (ref 4.0–10.5)
nRBC: 0 % (ref 0.0–0.2)

## 2019-11-07 MED ORDER — SODIUM CHLORIDE 0.9 % IV SOLN
Freq: Once | INTRAVENOUS | Status: AC
  Filled 2019-11-07: qty 250

## 2019-11-07 MED ORDER — TRASTUZUMAB-DKST CHEMO 150 MG IV SOLR
6.0000 mg/kg | Freq: Once | INTRAVENOUS | Status: AC
  Administered 2019-11-07: 357 mg via INTRAVENOUS
  Filled 2019-11-07: qty 17

## 2019-11-22 ENCOUNTER — Ambulatory Visit
Admission: RE | Admit: 2019-11-22 | Discharge: 2019-11-22 | Disposition: A | Discharge status: Home / Self Care | Payer: PPO | Source: Ambulatory Visit | Attending: Oncology | Admitting: Oncology

## 2019-11-22 ENCOUNTER — Other Ambulatory Visit: Payer: Self-pay

## 2019-11-22 DIAGNOSIS — C50411 Malignant neoplasm of upper-outer quadrant of right female breast: Secondary | ICD-10-CM | POA: Diagnosis not present

## 2019-11-22 DIAGNOSIS — I501 Left ventricular failure: Secondary | ICD-10-CM | POA: Insufficient documentation

## 2019-11-22 DIAGNOSIS — C50919 Malignant neoplasm of unspecified site of unspecified female breast: Secondary | ICD-10-CM | POA: Diagnosis not present

## 2019-11-22 MED ORDER — TECHNETIUM TC 99M-LABELED RED BLOOD CELLS IV KIT
20.0000 | PACK | Freq: Once | INTRAVENOUS | Status: AC | PRN
  Administered 2019-11-22: 20.68 via INTRAVENOUS

## 2019-11-28 ENCOUNTER — Other Ambulatory Visit: Payer: Self-pay | Admitting: *Deleted

## 2019-11-28 ENCOUNTER — Other Ambulatory Visit: Payer: Self-pay

## 2019-11-28 ENCOUNTER — Inpatient Hospital Stay: Payer: PPO

## 2019-11-28 VITALS — BP 155/83 | HR 73 | Temp 97.2°F | Wt 128.8 lb

## 2019-11-28 DIAGNOSIS — C50411 Malignant neoplasm of upper-outer quadrant of right female breast: Secondary | ICD-10-CM

## 2019-11-28 MED ORDER — SODIUM CHLORIDE 0.9 % IV SOLN
Freq: Once | INTRAVENOUS | Status: DC
  Filled 2019-11-28: qty 250

## 2019-11-28 MED ORDER — TRASTUZUMAB-DKST CHEMO 150 MG IV SOLR: 6.0000 mg/kg | Freq: Once | INTRAVENOUS | Status: DC

## 2019-11-28 NOTE — Progress Notes (Signed)
MUGA results reviewed with MD and treatment team. Per MD to hold treatment at this time. MD updated pt. VSS. Pt stable for discharge.   Gwyndolyn Guilford [employer]

## 2019-12-04 ENCOUNTER — Ambulatory Visit: Payer: PPO

## 2019-12-05 ENCOUNTER — Ambulatory Visit
Admission: RE | Admit: 2019-12-05 | Discharge: 2019-12-05 | Disposition: A | Discharge status: Home / Self Care | Payer: PPO | Source: Ambulatory Visit | Attending: General Surgery | Admitting: General Surgery

## 2019-12-05 ENCOUNTER — Other Ambulatory Visit: Payer: Self-pay

## 2019-12-05 DIAGNOSIS — C50411 Malignant neoplasm of upper-outer quadrant of right female breast: Secondary | ICD-10-CM | POA: Diagnosis not present

## 2019-12-05 DIAGNOSIS — R922 Inconclusive mammogram: Secondary | ICD-10-CM | POA: Diagnosis not present

## 2019-12-05 HISTORY — DX: Personal history of antineoplastic chemotherapy: Z92.21

## 2019-12-11 ENCOUNTER — Ambulatory Visit: Payer: PPO | Attending: Internal Medicine

## 2019-12-11 DIAGNOSIS — Z23 Encounter for immunization: Secondary | ICD-10-CM

## 2019-12-11 NOTE — Progress Notes (Signed)
   Covid-19 Vaccination Clinic  Name:  Hayley Nolan    MRN: [id number] DOB: Jul 23, 1927  12/11/2019  Hayley Nolan was observed post Covid-19 immunization for 15 minutes without incident. She was provided with Vaccine Information Sheet and instruction to access the V-Safe system.   Hayley Nolan was instructed to call 911 with any severe reactions post vaccine: Marland Kitchen Difficulty breathing  . Swelling of face and throat  . A fast heartbeat  . A bad rash all over body  . Dizziness and weakness

## 2019-12-19 ENCOUNTER — Ambulatory Visit: Payer: PPO | Admitting: Oncology

## 2019-12-19 ENCOUNTER — Ambulatory Visit: Payer: PPO

## 2019-12-19 ENCOUNTER — Other Ambulatory Visit: Payer: PPO

## 2019-12-19 DIAGNOSIS — Z853 Personal history of malignant neoplasm of breast: Secondary | ICD-10-CM | POA: Diagnosis not present

## 2019-12-20 ENCOUNTER — Other Ambulatory Visit: Payer: Self-pay

## 2019-12-20 ENCOUNTER — Ambulatory Visit
Admission: RE | Admit: 2019-12-20 | Discharge: 2019-12-20 | Disposition: A | Discharge status: Home / Self Care | Payer: PPO | Source: Ambulatory Visit | Attending: Oncology | Admitting: Oncology

## 2019-12-20 DIAGNOSIS — Z5111 Encounter for antineoplastic chemotherapy: Secondary | ICD-10-CM | POA: Diagnosis not present

## 2019-12-20 DIAGNOSIS — I499 Cardiac arrhythmia, unspecified: Secondary | ICD-10-CM | POA: Diagnosis not present

## 2019-12-20 DIAGNOSIS — C50911 Malignant neoplasm of unspecified site of right female breast: Secondary | ICD-10-CM | POA: Diagnosis not present

## 2019-12-20 DIAGNOSIS — C50411 Malignant neoplasm of upper-outer quadrant of right female breast: Secondary | ICD-10-CM | POA: Diagnosis not present

## 2019-12-20 DIAGNOSIS — Z08 Encounter for follow-up examination after completed treatment for malignant neoplasm: Secondary | ICD-10-CM | POA: Insufficient documentation

## 2019-12-20 MED ORDER — TECHNETIUM TC 99M-LABELED RED BLOOD CELLS IV KIT
20.0000 | PACK | Freq: Once | INTRAVENOUS | Status: AC | PRN
  Administered 2019-12-20: 22.015 via INTRAVENOUS

## 2019-12-22 NOTE — Progress Notes (Signed)
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  INTERVAL HISTORY: Patient returns to clinic today for further evaluation and reconsideration of cycle 15 of maintenance Orgivri.  Treatment was delayed over 1 month secondary to decreased ejection fraction, which has now recovered and is within normal limits.  She continues to feel well and remains asymptomatic.  She is tolerating letrozole and Fosamax without significant side effects.  She has no neurologic complaints.  She denies any recent fevers or illnesses.  She has a good appetite and denies weight loss.  She has no chest pain, shortness of breath, cough, or hemoptysis.  She denies any nausea, vomiting, constipation, or diarrhea.  She has no urinary complaints.  Patient offers no specific complaints today.  REVIEW OF SYSTEMS:   Review of Systems  Constitutional: Negative.  Negative for fever, malaise/fatigue and weight loss.  Respiratory: Negative.  Negative for cough, hemoptysis and shortness of breath.   Cardiovascular: Negative.  Negative for chest pain and leg swelling.  Gastrointestinal: Negative.  Negative for abdominal pain.  Genitourinary: Negative.  Negative for dysuria.  Musculoskeletal: Negative.  Negative for back pain.  Skin: Negative.  Negative for rash.  Neurological: Negative.  Negative for dizziness, focal weakness, weakness and headaches.  Psychiatric/Behavioral:  Negative.  The patient is not nervous/anxious.     As per HPI. Otherwise, a complete review of systems is negative.  PAST MEDICAL HISTORY: Past Medical History:  Diagnosis Date  . Asthma   . Basal cell carcinoma 06/29/2016   L lat chin  . Breast cancer [facility]) 1999   right breast  . Breast cancer ([place]) 12/2018   left breast  . Cancer ([place]) 1999   Left breast pre cancerous- mastectomy with reconstruction  . Hypertension   . Personal history of chemotherapy     PAST SURGICAL HISTORY: Past Surgical History:  Procedure Laterality Date  . AUGMENTATION MAMMAPLASTY Left 1999  . BREAST BIOPSY Right 11/30/2018   affirm stereo bx of calcs, x marker, path pending  . BREAST BIOPSY Right 11/30/2018   affirm bx of calcs, ribbon marker,path pending  . BREAST LUMPECTOMY Left 12/16/2018  . BREAST LUMPECTOMY WITH NEEDLE LOCALIZATION AND AXILLARY SENTINEL LYMPH NODE BX Right 12/16/2018   Procedure: BREAST LUMPECTOMY WITH NEEDLE LOCALIZATION AND AXILLARY SENTINEL LYMPH NODE BX;  Surgeon: Robert Bellow, MD;  Location: [facility] ORS;  Service: General;  Laterality: Right;  . KIDNEY CYST REMOVAL    . MASTECTOMY Left 1999   Implant  . REDUCTION MAMMAPLASTY Right 1997    FAMILY HISTORY: Family History  Problem Relation Age of Onset  . Breast cancer Neg Hx     ADVANCED DIRECTIVES (Y/N):  N  HEALTH MAINTENANCE: Social History   Tobacco Use  . Smoking status: Never Smoker  . Smokeless tobacco: Never Used  Vaping Use  . Vaping Use: Never used  Substance Use Topics  . Alcohol use: Never  . Drug use: Never     Colonoscopy:  PAP:  Bone density:  Lipid panel:  Allergies  Allergen Reactions  . Cephalexin Other (See Comments)    Possible headache     Current Outpatient Medications  Medication Sig Dispense Refill  . ADVAIR DISKUS 250-50 MCG/DOSE AEPB Inhale 1 puff into the lungs every 12 (twelve) hours.    Marland Kitchen alendronate (FOSAMAX) 70 MG tablet Take 1 tablet (70 mg total) by mouth  once a week. Take with a full glass of water on an empty stomach. 12 tablet 3  . Biotin (BIOTIN 5000) 5 MG CAPS Take 5 mg by mouth daily.    . Cholecalciferol 25 MCG (1000 UT) tablet Take 1,000 Units by mouth daily.    Marland Kitchen letrozole (FEMARA) 2.5 MG tablet Take 1 tablet (2.5 mg total) by mouth daily. 90 tablet 3  . mometasone (NASONEX) 50 MCG/ACT nasal spray Place into the nose.    . Multiple Vitamins-Minerals (MULTIVITAL PLATINUM SILVER PO) Take 1 tablet by mouth daily.    . Multiple Vitamins-Minerals (PRESERVISION AREDS) CAPS Take 1 capsule by mouth 2 (two) times daily.     . potassium chloride (K-DUR,KLOR-CON) 10 MEQ tablet Take 10 mEq by mouth 2 (two) times daily.    Marland Kitchen PROAIR HFA 108 (90 Base) MCG/ACT inhaler Inhale 2 puffs into the lungs every 4 (four) hours as needed for wheezing.    Marland Kitchen spironolactone (ALDACTONE) 25 MG tablet Take 25 mg by mouth daily.    . trastuzumab-dkst 2 mg/kg in sodium chloride 0.9 % 250 mL Inject 2 mg/kg into the vein once.     No current facility-administered medications for this visit.    OBJECTIVE: Vitals:   12/26/19 0920  BP: (!) 168/86  Pulse: 68  Temp: 97.9 F (36.6 C)  SpO2: 98%     Body mass index is 22.09 kg/m.    ECOG FS:0 - Asymptomatic  General: Well-developed, well-nourished, no acute distress. Eyes: Pink conjunctiva, anicteric sclera. HEENT: Normocephalic, moist mucous membranes. Lungs: No audible wheezing or coughing. Heart: Regular rate and rhythm. Abdomen: Soft, nontender, no obvious distention. Musculoskeletal: No edema, cyanosis, or clubbing. Neuro: Alert, answering all questions appropriately. Cranial nerves grossly intact. Skin: No rashes or petechiae noted. Psych: Normal affect.   LAB RESULTS:  Lab Results  Component Value Date   NA 139 12/26/2019   K 4.0 12/26/2019   CL 102 12/26/2019   CO2 29 12/26/2019   GLUCOSE 96 12/26/2019   BUN 21 12/26/2019   CREATININE 0.94 12/26/2019   CALCIUM 9.2 12/26/2019   PROT 6.2 (L)  12/26/2019   ALBUMIN 4.0 12/26/2019   AST 19 12/26/2019   ALT 21 12/26/2019   ALKPHOS 45 12/26/2019   BILITOT 0.9 12/26/2019   GFRNONAA 58 (L) 12/26/2019   GFRAA >60 11/07/2019    Lab Results  Component Value Date   WBC 6.6 12/26/2019   NEUTROABS 4.9 12/26/2019   HGB 13.4 12/26/2019   HCT 40.3 12/26/2019   MCV 89.4 12/26/2019   PLT 239 12/26/2019     STUDIES: NM Cardiac Muga Rest  Result Date: 12/20/2019 CLINICAL DATA:  RIGHT breast cancer, cardiotoxic chemotherapy EXAM: NUCLEAR MEDICINE CARDIAC BLOOD POOL IMAGING (MUGA) TECHNIQUE: Cardiac multi-gated acquisition was performed at rest following intravenous injection of Tc-57mlabeled red blood cells. RADIOPHARMACEUTICALS:  22.015 mCi Tc-94mertechnetate in-vitro labeled red blood cells IV COMPARISON:  11/22/2019 FINDINGS: Calculated LEFT ventricular ejection fraction is 56.7%, normal. Study was obtained at a cardiac rate of 75 bpm. Patient was mildly arrhythmic during imaging, with multiple rejected beats, similar to prior exam. Cine  analysis of the LEFT ventricle in 3 projections demonstrates no focal LEFT ventricular wall motion abnormalities. IMPRESSION: Normal LEFT ventricular ejection fraction of 56.7%, increased from the 48.6% on the prior exam. No focal LEFT ventricular wall motion abnormalities. Electronically Signed   By: Lavonia Dana M.D.   On: 12/20/2019 13:37   MM DIAG BREAST TOMO UNI RIGHT  Result Date: 12/05/2019 CLINICAL DATA:  86 year old female presenting for routine annual surveillance status post right breast lumpectomy in October of 2020. The patient has history of a left breast mastectomy in 1999. EXAM: DIGITAL DIAGNOSTIC UNILATERAL RIGHT MAMMOGRAM WITH TOMO AND CAD COMPARISON:  Previous exam(s). ACR Breast Density Category c: The breast tissue is heterogeneously dense, which may obscure small masses. FINDINGS: Expected surgical changes noted in the lateral right breast consistent with history of lumpectomy. No  suspicious calcifications, masses or areas of distortion are seen in the bilateral breasts. Mammographic images were processed with CAD. IMPRESSION: Expected surgical changes in the lateral right breast consistent with history of lumpectomy. No mammographic evidence of malignancy in the bilateral breasts. RECOMMENDATION: Diagnostic mammogram is suggested in 1 year. (Code:[id number]-B-01Y) I have discussed the findings and recommendations with the patient. If applicable, a reminder letter will be sent to the patient regarding the next appointment. BI-RADS CATEGORY  2: Benign. Electronically Signed   By: Ammie Ferrier M.D.   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  PLAN:    1. 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  Although guidelines recommend reexcision given her close margins, the benefit of this is unclear given patient's advanced age and she ultimately chose not to undergo resection.  She completed adjuvant XRT.  She declined adjuvant treatment with chemotherapy, but wanted to pursue single agent Orgivri.  She expressed understanding that this may not be as effective and her chance of recurrence increases.  Patient EF decreased to less than 50% on MUGA scan and treatment was delayed.  Her most recent MUGA on December 20, 2019 revealed an EF of 56.7%.  Continue letrozole for a total of 5 years completing treatment in January 2026, but given her high risk disease, will consider extended treatment.  Proceed with cycle 15 of maintenance Orgivri today.  Return to clinic in 3 weeks for treatment only and then in 6 weeks for further evaluation and consideration of cycle 17. 2.   Osteoporosis: Baseline bone mineral density on March 21, 2019 revealed a T score of -2.9 which is considered osteoporosis.  Continue Fosamax, calcium, and vitamin D.  Repeat in January 2022. 3.  Hypertension: Chronic and unchanged.  Continue follow-up and treatment per primary care.  I spent a total of 30 minutes reviewing chart data, face-to-face evaluation with the patient, counseling and coordination of care as detailed above.    Patient expressed understanding and was in agreement with this plan. She also understands that She can call clinic at any time with any questions, concerns, or complaints.   Cancer Staging Primary cancer of upper outer quadrant of right female breast [facility]) Staging form: Breast, AJCC 8th Edition - Clinical stage from 12/12/2018: Stage IB (cT2, cN0, cM0, G3, ER+, PR+, HER2+) - Signed by Lloyd Huger, MD on 01/11/2019  Lloyd Huger, MD   12/26/2019 10:02 AM

## 2019-12-26 ENCOUNTER — Inpatient Hospital Stay: Payer: PPO

## 2019-12-26 ENCOUNTER — Inpatient Hospital Stay: Payer: PPO | Attending: Oncology | Admitting: Oncology

## 2019-12-26 VITALS — BP 168/86 | HR 68 | Temp 97.9°F | Ht 64.0 in | Wt 128.7 lb

## 2019-12-26 DIAGNOSIS — C50911 Malignant neoplasm of unspecified site of right female breast: Secondary | ICD-10-CM

## 2019-12-26 DIAGNOSIS — D0511 Intraductal carcinoma in situ of right breast: Secondary | ICD-10-CM | POA: Diagnosis not present

## 2019-12-26 DIAGNOSIS — Z5112 Encounter for antineoplastic immunotherapy: Secondary | ICD-10-CM | POA: Insufficient documentation

## 2019-12-26 DIAGNOSIS — C50411 Malignant neoplasm of upper-outer quadrant of right female breast: Secondary | ICD-10-CM | POA: Insufficient documentation

## 2019-12-26 DIAGNOSIS — Z170 Estrogen receptor positive status [ER+]: Secondary | ICD-10-CM | POA: Diagnosis not present

## 2019-12-26 LAB — CBC WITH DIFFERENTIAL/PLATELET
Abs Immature Granulocytes: 0.03 10*3/uL (ref 0.00–0.07)
Basophils Absolute: 0 10*3/uL (ref 0.0–0.1)
Basophils Relative: 1 %
Eosinophils Absolute: 0.2 10*3/uL (ref 0.0–0.5)
Eosinophils Relative: 4 %
HCT: 40.3 % (ref 36.0–46.0)
Hemoglobin: 13.4 g/dL (ref 12.0–15.0)
Immature Granulocytes: 1 %
Lymphocytes Relative: 14 %
Lymphs Abs: 0.9 10*3/uL (ref 0.7–4.0)
MCH: 29.7 pg (ref 26.0–34.0)
MCHC: 33.3 g/dL (ref 30.0–36.0)
MCV: 89.4 fL (ref 80.0–100.0)
Monocytes Absolute: 0.6 10*3/uL (ref 0.1–1.0)
Monocytes Relative: 9 %
Neutro Abs: 4.9 10*3/uL (ref 1.7–7.7)
Neutrophils Relative %: 71 %
Platelets: 239 10*3/uL (ref 150–400)
RBC: 4.51 MIL/uL (ref 3.87–5.11)
RDW: 14.5 % (ref 11.5–15.5)
WBC: 6.6 10*3/uL (ref 4.0–10.5)
nRBC: 0 % (ref 0.0–0.2)

## 2019-12-26 LAB — COMPREHENSIVE METABOLIC PANEL
ALT: 21 U/L (ref 0–44)
AST: 19 U/L (ref 15–41)
Albumin: 4 g/dL (ref 3.5–5.0)
Alkaline Phosphatase: 45 U/L (ref 38–126)
Anion gap: 8 (ref 5–15)
BUN: 21 mg/dL (ref 8–23)
CO2: 29 mmol/L (ref 22–32)
Calcium: 9.2 mg/dL (ref 8.9–10.3)
Chloride: 102 mmol/L (ref 98–111)
Creatinine, Ser: 0.94 mg/dL (ref 0.44–1.00)
GFR, Estimated: 58 mL/min — ABNORMAL LOW (ref >60)
Glucose, Bld: 96 mg/dL (ref 70–99)
Potassium: 4 mmol/L (ref 3.5–5.1)
Sodium: 139 mmol/L (ref 135–145)
Total Bilirubin: 0.9 mg/dL (ref 0.3–1.2)
Total Protein: 6.2 g/dL — ABNORMAL LOW (ref 6.5–8.1)

## 2019-12-26 MED ORDER — SODIUM CHLORIDE 0.9 % IV SOLN
Freq: Once | INTRAVENOUS | Status: AC
  Filled 2019-12-26: qty 250

## 2019-12-26 MED ORDER — TRASTUZUMAB-DKST CHEMO 150 MG IV SOLR
6.0000 mg/kg | Freq: Once | INTRAVENOUS | Status: AC
  Administered 2019-12-26: 357 mg via INTRAVENOUS
  Filled 2019-12-26: qty 17

## 2020-01-02 ENCOUNTER — Other Ambulatory Visit: Payer: Self-pay | Admitting: Oncology

## 2020-01-02 DIAGNOSIS — C50411 Malignant neoplasm of upper-outer quadrant of right female breast: Secondary | ICD-10-CM

## 2020-01-08 DIAGNOSIS — H903 Sensorineural hearing loss, bilateral: Secondary | ICD-10-CM | POA: Diagnosis not present

## 2020-01-08 DIAGNOSIS — H6123 Impacted cerumen, bilateral: Secondary | ICD-10-CM | POA: Diagnosis not present

## 2020-01-09 DIAGNOSIS — H40053 Ocular hypertension, bilateral: Secondary | ICD-10-CM | POA: Diagnosis not present

## 2020-01-16 ENCOUNTER — Other Ambulatory Visit: Payer: Self-pay

## 2020-01-16 ENCOUNTER — Inpatient Hospital Stay: Payer: PPO | Attending: Oncology

## 2020-01-16 VITALS — BP 175/89 | HR 47 | Temp 96.3°F | Resp 20 | Wt 128.1 lb

## 2020-01-16 DIAGNOSIS — Z5112 Encounter for antineoplastic immunotherapy: Secondary | ICD-10-CM | POA: Insufficient documentation

## 2020-01-16 DIAGNOSIS — D0511 Intraductal carcinoma in situ of right breast: Secondary | ICD-10-CM | POA: Diagnosis not present

## 2020-01-16 DIAGNOSIS — C50411 Malignant neoplasm of upper-outer quadrant of right female breast: Secondary | ICD-10-CM | POA: Diagnosis not present

## 2020-01-16 MED ORDER — TRASTUZUMAB-DKST CHEMO 150 MG IV SOLR
6.0000 mg/kg | Freq: Once | INTRAVENOUS | Status: AC
  Administered 2020-01-16: 357 mg via INTRAVENOUS
  Filled 2020-01-16: qty 17

## 2020-01-16 MED ORDER — SODIUM CHLORIDE 0.9 % IV SOLN
Freq: Once | INTRAVENOUS | Status: AC
  Filled 2020-01-16: qty 250

## 2020-01-24 DIAGNOSIS — H40053 Ocular hypertension, bilateral: Secondary | ICD-10-CM | POA: Diagnosis not present

## 2020-02-03 NOTE — Progress Notes (Signed)
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  INTERVAL HISTORY: Patient returns to clinic today for further evaluation and consideration of cycle 17 of maintenance Orgivri.  She continues to feel well and remains asymptomatic.  She is tolerating letrozole without significant side effects.  Patient admits that she has not taken any Fosamax as prescribed.  She has no neurologic complaints.  She denies any recent fevers or illnesses.  She has a good appetite and denies weight loss.  She has no chest pain, shortness of breath, cough, or hemoptysis.  She denies any nausea, vomiting, constipation, or diarrhea.  She has no urinary complaints.  Patient offers no specific complaints today.  REVIEW OF SYSTEMS:   Review of Systems  Constitutional: Negative.  Negative for fever, malaise/fatigue and weight loss.  Respiratory: Negative.  Negative for cough, hemoptysis and shortness of breath.   Cardiovascular: Negative.  Negative for chest pain and leg swelling.  Gastrointestinal: Negative.  Negative for abdominal pain.  Genitourinary: Negative.  Negative for dysuria.  Musculoskeletal: Negative.  Negative for back pain.  Skin: Negative.  Negative for rash.  Neurological: Negative.  Negative for dizziness, focal weakness, weakness and headaches.  Psychiatric/Behavioral: Negative.  The patient is not nervous/anxious.     As per HPI. Otherwise, a  complete review of systems is negative.  PAST MEDICAL HISTORY: Past Medical History:  Diagnosis Date  . Asthma   . Basal cell carcinoma 06/29/2016   L lat chin  . Breast cancer [facility]) 1999   right breast  . Breast cancer ([place]) 12/2018   left breast  . Cancer ([place]) 1999   Left breast pre cancerous- mastectomy with reconstruction  . Hypertension   . Personal history of chemotherapy     PAST SURGICAL HISTORY: Past Surgical History:  Procedure Laterality Date  . AUGMENTATION MAMMAPLASTY Left 1999  . BREAST BIOPSY Right 11/30/2018   affirm stereo bx of calcs, x marker, path pending  . BREAST BIOPSY Right 11/30/2018   affirm bx of calcs, ribbon marker,path pending  . BREAST LUMPECTOMY Left 12/16/2018  . BREAST LUMPECTOMY WITH NEEDLE LOCALIZATION AND AXILLARY SENTINEL LYMPH NODE BX Right 12/16/2018   Procedure: BREAST LUMPECTOMY WITH NEEDLE LOCALIZATION AND AXILLARY SENTINEL LYMPH NODE BX;  Surgeon: Robert Bellow, MD;  Location: [facility] ORS;  Service: General;  Laterality: Right;  . KIDNEY CYST REMOVAL    . MASTECTOMY Left 1999   Implant  . REDUCTION MAMMAPLASTY Right 1997    FAMILY HISTORY: Family History  Problem Relation Age of Onset  . Breast cancer Neg Hx     ADVANCED DIRECTIVES (Y/N):  N  HEALTH MAINTENANCE: Social History   Tobacco Use  . Smoking status: Never Smoker  . Smokeless tobacco: Never Used  Vaping Use  . Vaping Use: Never used  Substance Use Topics  . Alcohol use: Never  . Drug use: Never     Colonoscopy:  PAP:  Bone density:  Lipid panel:  Allergies  Allergen Reactions  . Cephalexin  Other (See Comments)    Possible headache     Current Outpatient Medications  Medication Sig Dispense Refill  . ADVAIR DISKUS 250-50 MCG/DOSE AEPB Inhale 1 puff into the lungs every 12 (twelve) hours.    . Biotin (BIOTIN 5000) 5 MG CAPS Take 5 mg by mouth daily.    . Cholecalciferol 25 MCG (1000 UT) tablet Take 1,000 Units by mouth daily.    Marland Kitchen letrozole  (FEMARA) 2.5 MG tablet TAKE 1 TABLET BY MOUTH DAILY 90 tablet 3  . Multiple Vitamins-Minerals (MULTIVITAL PLATINUM SILVER PO) Take 1 tablet by mouth daily.    . potassium chloride (K-DUR,KLOR-CON) 10 MEQ tablet Take 10 mEq by mouth 2 (two) times daily.    Marland Kitchen PROAIR HFA 108 (90 Base) MCG/ACT inhaler Inhale 2 puffs into the lungs every 4 (four) hours as needed for wheezing.    Marland Kitchen alendronate (FOSAMAX) 70 MG tablet Take 1 tablet (70 mg total) by mouth once a week. Take with a full glass of water on an empty stomach. (Patient not taking: Reported on 02/06/2020) 12 tablet 3  . mometasone (NASONEX) 50 MCG/ACT nasal spray Place into the nose. (Patient not taking: Reported on 02/06/2020)    . Multiple Vitamins-Minerals (PRESERVISION AREDS) CAPS Take 1 capsule by mouth 2 (two) times daily.  (Patient not taking: Reported on 02/06/2020)    . trastuzumab-dkst 2 mg/kg in sodium chloride 0.9 % 250 mL Inject 2 mg/kg into the vein once. (Patient not taking: Reported on 02/06/2020)     No current facility-administered medications for this visit.   Facility-Administered Medications Ordered in Other Visits  Medication Dose Route Frequency Provider Last Rate Last Admin  . 0.9 %  sodium chloride infusion   Intravenous Once Lloyd Huger, MD      . trastuzumab-dkst (OGIVRI) 357 mg in sodium chloride 0.9 % 250 mL chemo infusion  6 mg/kg (Treatment Plan Recorded) Intravenous Once Lloyd Huger, MD        OBJECTIVE: Vitals:   02/06/20 0946  BP: (!) 167/81  Pulse: 77  Resp: 16  Temp: (!) 97.2 F (36.2 C)  SpO2: 98%     Body mass index is 21.86 kg/m.    ECOG FS:0 - Asymptomatic  General: Well-developed, well-nourished, no acute distress. Eyes: Pink conjunctiva, anicteric sclera. HEENT: Normocephalic, moist mucous membranes. Lungs: No audible wheezing or coughing. Heart: Regular rate and rhythm. Abdomen: Soft, nontender, no obvious distention. Musculoskeletal: No edema, cyanosis, or clubbing. Neuro:  Alert, answering all questions appropriately. Cranial nerves grossly intact. Skin: No rashes or petechiae noted. Psych: Normal affect.   LAB RESULTS:  Lab Results  Component Value Date   NA 139 02/06/2020   K 4.6 02/06/2020   CL 100 02/06/2020   CO2 31 02/06/2020   GLUCOSE 93 02/06/2020   BUN 21 02/06/2020   CREATININE 0.90 02/06/2020   CALCIUM 9.8 02/06/2020   PROT 6.2 (L) 02/06/2020   ALBUMIN 4.0 02/06/2020   AST 22 02/06/2020   ALT 22 02/06/2020   ALKPHOS 48 02/06/2020   BILITOT 0.7 02/06/2020   GFRNONAA >60 02/06/2020   GFRAA >60 11/07/2019    Lab Results  Component Value Date   WBC 6.9 02/06/2020   NEUTROABS 5.0 02/06/2020   HGB 13.1 02/06/2020   HCT 39.9 02/06/2020   MCV 91.5 02/06/2020   PLT 232 02/06/2020     STUDIES: No results found.  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  PLAN:    1. 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  Although guidelines recommend reexcision given her close margins, the benefit of this is unclear given patient's advanced age and she ultimately chose not to undergo resection.  She completed adjuvant XRT.  She declined adjuvant treatment with chemotherapy, but wanted to pursue single agent Orgivri.  She expressed understanding that this may not be as effective and her chance of recurrence increases.  Patient EF decreased to less than 50% on MUGA scan and treatment was delayed.  Her most recent MUGA on December 20, 2019 revealed an EF of 56.7%.  Continue letrozole for a total of 5 years completing treatment in January 2026, but given her high risk disease, will consider extended treatment.  Her most recent mammogram on December 05, 2019  was reported as BI-RADS 2.  Proceed with cycle 17 of maintenance Orgivri today.  Return to clinic in 3 weeks for her 18th and final treatment.  Patient will then return to clinic in 3 months for routine evaluation. 2.  Osteoporosis: Baseline bone mineral density on March 21, 2019 revealed a T score of -2.9 which is considered osteoporosis.  Patient now admits she never took Fosamax.  Is unclear if she is taking calcium or vitamin D supplementation.  Repeat bone mineral density in January 2022. 3.  Hypertension: Chronic and unchanged.  Continue follow-up and treatment per primary care.    Patient expressed understanding and was in agreement with this plan. She also understands that She can call clinic at any time with any questions, concerns, or complaints.   Cancer Staging Primary cancer of upper outer quadrant of right female breast [facility]) Staging form: Breast, AJCC 8th Edition - Clinical stage from 12/12/2018: Stage IB (cT2, cN0, cM0, G3, ER+, PR+, HER2+) - Signed by Lloyd Huger, MD on 01/11/2019   Lloyd Huger, MD   02/06/2020 10:36 AM

## 2020-02-06 ENCOUNTER — Inpatient Hospital Stay (HOSPITAL_BASED_OUTPATIENT_CLINIC_OR_DEPARTMENT_OTHER): Payer: PPO | Admitting: Oncology

## 2020-02-06 ENCOUNTER — Other Ambulatory Visit: Payer: Self-pay

## 2020-02-06 ENCOUNTER — Inpatient Hospital Stay: Payer: PPO

## 2020-02-06 ENCOUNTER — Encounter: Payer: Self-pay | Admitting: Oncology

## 2020-02-06 ENCOUNTER — Inpatient Hospital Stay: Payer: PPO | Attending: Oncology

## 2020-02-06 VITALS — BP 167/81 | HR 77 | Temp 97.2°F | Resp 16 | Wt 127.4 lb

## 2020-02-06 DIAGNOSIS — D0511 Intraductal carcinoma in situ of right breast: Secondary | ICD-10-CM | POA: Insufficient documentation

## 2020-02-06 DIAGNOSIS — Z170 Estrogen receptor positive status [ER+]: Secondary | ICD-10-CM | POA: Diagnosis not present

## 2020-02-06 DIAGNOSIS — C50411 Malignant neoplasm of upper-outer quadrant of right female breast: Secondary | ICD-10-CM | POA: Insufficient documentation

## 2020-02-06 DIAGNOSIS — Z5112 Encounter for antineoplastic immunotherapy: Secondary | ICD-10-CM | POA: Diagnosis not present

## 2020-02-06 DIAGNOSIS — C50911 Malignant neoplasm of unspecified site of right female breast: Secondary | ICD-10-CM

## 2020-02-06 LAB — CBC WITH DIFFERENTIAL/PLATELET
Abs Immature Granulocytes: 0.03 10*3/uL (ref 0.00–0.07)
Basophils Absolute: 0 10*3/uL (ref 0.0–0.1)
Basophils Relative: 1 %
Eosinophils Absolute: 0.2 10*3/uL (ref 0.0–0.5)
Eosinophils Relative: 3 %
HCT: 39.9 % (ref 36.0–46.0)
Hemoglobin: 13.1 g/dL (ref 12.0–15.0)
Immature Granulocytes: 0 %
Lymphocytes Relative: 13 %
Lymphs Abs: 0.9 10*3/uL (ref 0.7–4.0)
MCH: 30 pg (ref 26.0–34.0)
MCHC: 32.8 g/dL (ref 30.0–36.0)
MCV: 91.5 fL (ref 80.0–100.0)
Monocytes Absolute: 0.7 10*3/uL (ref 0.1–1.0)
Monocytes Relative: 11 %
Neutro Abs: 5 10*3/uL (ref 1.7–7.7)
Neutrophils Relative %: 72 %
Platelets: 232 10*3/uL (ref 150–400)
RBC: 4.36 MIL/uL (ref 3.87–5.11)
RDW: 14.2 % (ref 11.5–15.5)
WBC: 6.9 10*3/uL (ref 4.0–10.5)
nRBC: 0 % (ref 0.0–0.2)

## 2020-02-06 LAB — COMPREHENSIVE METABOLIC PANEL
ALT: 22 U/L (ref 0–44)
AST: 22 U/L (ref 15–41)
Albumin: 4 g/dL (ref 3.5–5.0)
Alkaline Phosphatase: 48 U/L (ref 38–126)
Anion gap: 8 (ref 5–15)
BUN: 21 mg/dL (ref 8–23)
CO2: 31 mmol/L (ref 22–32)
Calcium: 9.8 mg/dL (ref 8.9–10.3)
Chloride: 100 mmol/L (ref 98–111)
Creatinine, Ser: 0.9 mg/dL (ref 0.44–1.00)
GFR, Estimated: >60 mL/min (ref >60)
Glucose, Bld: 93 mg/dL (ref 70–99)
Potassium: 4.6 mmol/L (ref 3.5–5.1)
Sodium: 139 mmol/L (ref 135–145)
Total Bilirubin: 0.7 mg/dL (ref 0.3–1.2)
Total Protein: 6.2 g/dL — ABNORMAL LOW (ref 6.5–8.1)

## 2020-02-06 MED ORDER — SODIUM CHLORIDE 0.9 % IV SOLN
Freq: Once | INTRAVENOUS | Status: AC
  Filled 2020-02-06: qty 250

## 2020-02-06 MED ORDER — TRASTUZUMAB-DKST CHEMO 150 MG IV SOLR
6.0000 mg/kg | Freq: Once | INTRAVENOUS | Status: AC
  Administered 2020-02-06: 357 mg via INTRAVENOUS
  Filled 2020-02-06: qty 17

## 2020-02-06 NOTE — Progress Notes (Signed)
Pt tolerated infusion well with no signs of complications. VSS. Pt stable for discharge. Pt discharged home.   Hayley Nolan  

## 2020-02-26 DIAGNOSIS — N1831 Chronic kidney disease, stage 3a: Secondary | ICD-10-CM | POA: Diagnosis not present

## 2020-02-26 DIAGNOSIS — E7800 Pure hypercholesterolemia, unspecified: Secondary | ICD-10-CM | POA: Diagnosis not present

## 2020-02-26 DIAGNOSIS — I129 Hypertensive chronic kidney disease with stage 1 through stage 4 chronic kidney disease, or unspecified chronic kidney disease: Secondary | ICD-10-CM | POA: Diagnosis not present

## 2020-02-27 ENCOUNTER — Inpatient Hospital Stay: Payer: PPO

## 2020-02-27 ENCOUNTER — Other Ambulatory Visit: Payer: Self-pay

## 2020-02-27 VITALS — BP 165/68 | HR 72 | Temp 97.7°F | Resp 20 | Wt 126.0 lb

## 2020-02-27 DIAGNOSIS — C50411 Malignant neoplasm of upper-outer quadrant of right female breast: Secondary | ICD-10-CM

## 2020-02-27 DIAGNOSIS — Z5112 Encounter for antineoplastic immunotherapy: Secondary | ICD-10-CM | POA: Diagnosis not present

## 2020-02-27 MED ORDER — TRASTUZUMAB-DKST CHEMO 150 MG IV SOLR
6.0000 mg/kg | Freq: Once | INTRAVENOUS | Status: AC
  Administered 2020-02-27: 11:00:00 357 mg via INTRAVENOUS
  Filled 2020-02-27: qty 17

## 2020-02-27 MED ORDER — SODIUM CHLORIDE 0.9 % IV SOLN
Freq: Once | INTRAVENOUS | Status: AC
  Filled 2020-02-27: qty 250

## 2020-02-27 NOTE — Progress Notes (Signed)
1124- Patient tolerated treatment well. Patient stable and discharged to home at this time. 

## 2020-03-04 DIAGNOSIS — R270 Ataxia, unspecified: Secondary | ICD-10-CM | POA: Insufficient documentation

## 2020-03-04 DIAGNOSIS — N1831 Chronic kidney disease, stage 3a: Secondary | ICD-10-CM | POA: Diagnosis not present

## 2020-03-04 DIAGNOSIS — Z0000 Encounter for general adult medical examination without abnormal findings: Secondary | ICD-10-CM | POA: Diagnosis not present

## 2020-03-04 DIAGNOSIS — E7800 Pure hypercholesterolemia, unspecified: Secondary | ICD-10-CM | POA: Diagnosis not present

## 2020-03-04 DIAGNOSIS — J4521 Mild intermittent asthma with (acute) exacerbation: Secondary | ICD-10-CM | POA: Diagnosis not present

## 2020-03-04 DIAGNOSIS — C50919 Malignant neoplasm of unspecified site of unspecified female breast: Secondary | ICD-10-CM | POA: Insufficient documentation

## 2020-03-04 DIAGNOSIS — K219 Gastro-esophageal reflux disease without esophagitis: Secondary | ICD-10-CM | POA: Diagnosis not present

## 2020-03-04 DIAGNOSIS — I129 Hypertensive chronic kidney disease with stage 1 through stage 4 chronic kidney disease, or unspecified chronic kidney disease: Secondary | ICD-10-CM | POA: Diagnosis not present

## 2020-03-21 ENCOUNTER — Other Ambulatory Visit: Payer: Self-pay

## 2020-03-21 ENCOUNTER — Ambulatory Visit
Admission: RE | Admit: 2020-03-21 | Discharge: 2020-03-21 | Disposition: A | Discharge status: Home / Self Care | Payer: PPO | Source: Ambulatory Visit | Attending: Oncology | Admitting: Oncology

## 2020-03-21 DIAGNOSIS — Z780 Asymptomatic menopausal state: Secondary | ICD-10-CM | POA: Diagnosis not present

## 2020-03-21 DIAGNOSIS — M810 Age-related osteoporosis without current pathological fracture: Secondary | ICD-10-CM | POA: Diagnosis not present

## 2020-03-21 DIAGNOSIS — Z13820 Encounter for screening for osteoporosis: Secondary | ICD-10-CM | POA: Diagnosis not present

## 2020-03-21 DIAGNOSIS — C50411 Malignant neoplasm of upper-outer quadrant of right female breast: Secondary | ICD-10-CM

## 2020-03-21 DIAGNOSIS — Z923 Personal history of irradiation: Secondary | ICD-10-CM | POA: Insufficient documentation

## 2020-03-21 DIAGNOSIS — Z853 Personal history of malignant neoplasm of breast: Secondary | ICD-10-CM | POA: Diagnosis not present

## 2020-03-21 DIAGNOSIS — Z8739 Personal history of other diseases of the musculoskeletal system and connective tissue: Secondary | ICD-10-CM | POA: Diagnosis not present

## 2020-03-21 DIAGNOSIS — Z9221 Personal history of antineoplastic chemotherapy: Secondary | ICD-10-CM | POA: Diagnosis not present

## 2020-03-21 DIAGNOSIS — R29890 Loss of height: Secondary | ICD-10-CM | POA: Diagnosis not present

## 2020-05-06 ENCOUNTER — Telehealth: Payer: Self-pay | Admitting: Oncology

## 2020-05-06 ENCOUNTER — Encounter: Payer: Self-pay | Admitting: Oncology

## 2020-05-06 ENCOUNTER — Other Ambulatory Visit: Payer: Self-pay

## 2020-05-06 ENCOUNTER — Inpatient Hospital Stay: Payer: PPO | Attending: Oncology | Admitting: Oncology

## 2020-05-06 VITALS — BP 156/75 | HR 70 | Temp 98.7°F | Resp 20 | Wt 124.9 lb

## 2020-05-06 DIAGNOSIS — C50411 Malignant neoplasm of upper-outer quadrant of right female breast: Secondary | ICD-10-CM | POA: Insufficient documentation

## 2020-05-06 DIAGNOSIS — M810 Age-related osteoporosis without current pathological fracture: Secondary | ICD-10-CM | POA: Diagnosis not present

## 2020-05-06 DIAGNOSIS — C50911 Malignant neoplasm of unspecified site of right female breast: Secondary | ICD-10-CM

## 2020-05-06 DIAGNOSIS — I10 Essential (primary) hypertension: Secondary | ICD-10-CM | POA: Diagnosis not present

## 2020-05-06 DIAGNOSIS — D0511 Intraductal carcinoma in situ of right breast: Secondary | ICD-10-CM | POA: Diagnosis not present

## 2020-05-06 DIAGNOSIS — Z170 Estrogen receptor positive status [ER+]: Secondary | ICD-10-CM | POA: Insufficient documentation

## 2020-05-06 MED ORDER — TAMOXIFEN CITRATE 10 MG PO TABS: 10.0000 mg | ORAL_TABLET | Freq: Two times a day (BID) | ORAL | 3 refills | Status: DC

## 2020-05-06 NOTE — Telephone Encounter (Signed)
Left VM with pt's daughter to make her aware that we weren't able to schedule pt's mammogram in October because [facility] is not scheduling that far out and that we would call back next month to let her know when the appointment is made. Daughter requested that appt not be the week of OCT 10th.

## 2020-05-06 NOTE — Progress Notes (Signed)
Survivorship Care Plan visit completed.  Treatment summary reviewed and given to patient.  ASCO answers booklet reviewed and given to patient.  CARE program and Cancer Transitions discussed with patient along with other resources cancer center offers to patients and caregivers.  Patient verbalized understanding.  Patient declined for APP to have a Virtual visit to introduce them to the Survivorship Clinic.  Encouraged patient to call for any questions or concerns. 

## 2020-05-06 NOTE — Progress Notes (Signed)
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  INTERVAL HISTORY: Patient returns to clinic today for 10-monthfollow-up.  She is status post 18 cycles of maintenance [place] last given on 02/27/2020.  In the interim, she has done well. She continues to tolerate letrozole well without any side effects.  She never started Fosamax secondary to side effects she read in the insert.  States that she already has trouble swallowing occasionally and did not want to worsen this.  She has no neurologic complaints.  She denies any recent fevers or illnesses.  She has a good appetite and denies weight loss.  She has no chest pain, shortness of breath, cough, or hemoptysis.  She denies any nausea, vomiting, constipation, or diarrhea.  She has no urinary complaints.  Patient offers no specific complaints today.  REVIEW OF SYSTEMS:   Review of Systems  Constitutional: Negative.  Negative for fever, malaise/fatigue and weight loss.  Respiratory: Negative.  Negative for cough, hemoptysis and shortness of breath.   Cardiovascular: Negative.  Negative for chest pain and leg swelling.  Gastrointestinal: Negative.  Negative for abdominal pain.  Genitourinary: Negative.   Negative for dysuria.  Musculoskeletal: Negative.  Negative for back pain.  Skin: Negative.  Negative for rash.  Neurological: Negative.  Negative for dizziness, focal weakness, weakness and headaches.  Psychiatric/Behavioral: Negative.  The patient is not nervous/anxious.     As per HPI. Otherwise, a complete review of systems is negative.  PAST MEDICAL HISTORY: Past Medical History:  Diagnosis Date  . Asthma   . Basal cell carcinoma 06/29/2016   L lat chin  . Breast cancer ([facility] 1999   right breast  . [place]   [place]  . [place]   Left breast pre cancerous- mastectomy with reconstruction  . Hypertension   . Personal history of chemotherapy     PAST SURGICAL HISTORY: Past Surgical History:  Procedure Laterality Date  . AUGMENTATION MAMMAPLASTY Left 1999  . BREAST BIOPSY Right 11/30/2018   affirm stereo bx of calcs, x marker, path pending  . BREAST BIOPSY Right 11/30/2018   affirm bx of calcs, ribbon marker,path pending  . BREAST LUMPECTOMY Left 12/16/2018  . BREAST LUMPECTOMY WITH NEEDLE LOCALIZATION AND AXILLARY SENTINEL LYMPH NODE BX Right 12/16/2018   Procedure: BREAST LUMPECTOMY WITH NEEDLE LOCALIZATION AND AXILLARY SENTINEL LYMPH NODE BX;  Surgeon: BRobert Bellow MD;  Location: [facility] ORS;  Service: General;  Laterality: Right;  . KIDNEY CYST REMOVAL    . MASTECTOMY Left 1999   Implant  . REDUCTION MAMMAPLASTY Right 1997    FAMILY HISTORY: Family History  Problem Relation Age of Onset  . Breast cancer Neg Hx     ADVANCED DIRECTIVES (Y/N):  N  HEALTH MAINTENANCE: Social History   Tobacco Use  . Smoking status: Never Smoker  .  Smokeless tobacco: Never Used  Vaping Use  . Vaping Use: Never used  Substance Use Topics  . Alcohol use: Never  . Drug use: Never     Colonoscopy:  PAP:  Bone density:  Lipid panel:  Allergies  Allergen Reactions  . Cephalexin Other (See Comments)    Possible headache      Current Outpatient Medications  Medication Sig Dispense Refill  . ADVAIR DISKUS 250-50 MCG/DOSE AEPB Inhale 1 puff into the lungs every 12 (twelve) hours.    Marland Kitchen alendronate (FOSAMAX) 70 MG tablet Take 1 tablet (70 mg total) by mouth once a week. Take with a full glass of water on an empty stomach. (Patient not taking: Reported on 02/06/2020) 12 tablet 3  . Biotin (BIOTIN 5000) 5 MG CAPS Take 5 mg by mouth daily.    . Cholecalciferol 25 MCG (1000 UT) tablet Take 1,000 Units by mouth daily.    Marland Kitchen letrozole (FEMARA) 2.5 MG tablet TAKE 1 TABLET BY MOUTH DAILY 90 tablet 3  . mometasone (NASONEX) 50 MCG/ACT nasal spray Place into the nose. (Patient not taking: Reported on 02/06/2020)    . Multiple Vitamins-Minerals (MULTIVITAL PLATINUM SILVER PO) Take 1 tablet by mouth daily.    . Multiple Vitamins-Minerals (PRESERVISION AREDS) CAPS Take 1 capsule by mouth 2 (two) times daily.  (Patient not taking: Reported on 02/06/2020)    . potassium chloride (K-DUR,KLOR-CON) 10 MEQ tablet Take 10 mEq by mouth 2 (two) times daily.    Marland Kitchen PROAIR HFA 108 (90 Base) MCG/ACT inhaler Inhale 2 puffs into the lungs every 4 (four) hours as needed for wheezing.    . trastuzumab-dkst 2 mg/kg in sodium chloride 0.9 % 250 mL Inject 2 mg/kg into the vein once. (Patient not taking: Reported on 02/06/2020)     No current facility-administered medications for this visit.    OBJECTIVE: There were no vitals filed for this visit.   There is no height or weight on file to calculate BMI.    ECOG FS:0 - Asymptomatic  Physical Exam Constitutional:      General: Vital signs are normal.     Appearance: Normal appearance.  HENT:     Head: Normocephalic and atraumatic.  Eyes:     Pupils: Pupils are equal, round, and reactive to light.  Cardiovascular:     Rate and Rhythm: Normal rate and regular rhythm.     Heart sounds: Normal heart sounds. No murmur heard.   Pulmonary:     Effort: Pulmonary effort is normal.     Breath sounds:  Normal breath sounds. No wheezing.  Abdominal:     General: Bowel sounds are normal. There is no distension.     Palpations: Abdomen is soft.     Tenderness: There is no abdominal tenderness.  Musculoskeletal:        General: No edema. Normal range of motion.     Cervical back: Normal range of motion.  Skin:    General: Skin is warm and dry.     Findings: No rash.  Neurological:     Mental Status: She is alert and oriented to person, place, and time.  Psychiatric:        Judgment: Judgment normal.      LAB RESULTS:  Lab Results  Component Value Date   NA 139 02/06/2020   K 4.6 02/06/2020   CL 100 02/06/2020   CO2 31 02/06/2020   GLUCOSE 93 02/06/2020   BUN 21 02/06/2020   CREATININE 0.90 02/06/2020  CALCIUM 9.8 02/06/2020   PROT 6.2 (L) 02/06/2020   ALBUMIN 4.0 02/06/2020   AST 22 02/06/2020   ALT 22 02/06/2020   ALKPHOS 48 02/06/2020   BILITOT 0.7 02/06/2020   GFRNONAA >60 02/06/2020   GFRAA >60 11/07/2019    Lab Results  Component Value Date   WBC 6.9 02/06/2020   NEUTROABS 5.0 02/06/2020   HGB 13.1 02/06/2020   HCT 39.9 02/06/2020   MCV 91.5 02/06/2020   PLT 232 02/06/2020     STUDIES: No results found.  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  PLAN:    1. 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 -Discussed case with surgery and radiation oncology and ultimately decided to not undergo reexcision secondary to comorbidities and performance status. -She completed adjuvant XRT. -She declined adjuvant treatment with chemotherapy but was agreeable to single agent Orgivri.  -Treatment was postponed secondary to worsening EF.  -Repeat MUGA on 12/20/2019 showed  improvement of her EF from 50 to 56.7%. -Treatment was restarted and she completed 18 treatments of Orgivri on 02/27/2020. -She was started on letrozole.  -She will be due for a mammogram in October 2022.  Orders placed.  2.  Osteoporosis:  -Baseline bone mineral density on March 21, 2019 revealed a T score of -2.9 which is considered osteoporosis.   -She was prescribed Fosamax but did not take secondary to side effects she read on the insert.  We did speak at length about Fosamax and side effects.  She is willing to give it a try to see how she tolerates. -She is currently taking calcium and vitamin D daily. -Bone density from 03/21/2020 shows a T score of -3.5 (-2.9).  -Given worsening of her T score, would recommend switching from letrozole to tamoxifen and starting Fosamax. -We will recheck her bone density in 1 year.  3.  Hypertension:  -Chronic and unchanged.   -Continue follow-up and treatment per primary care.  Disposition: -New prescription sent for tamoxifen. -Stop letrozole. -Start Fosamax 70 mg weekly. -RTC in 3 months with labs (CBC, CMP) and to assess tolerance of tamoxifen.  Greater than 50% was spent in counseling and coordination of care with this patient including but not limited to discussion of the relevant topics above (See A&P) including, but not limited to diagnosis and management of acute and chronic medical conditions.   Patient expressed understanding and was in agreement with this plan. She also understands that She can call clinic at any time with any questions, concerns, or complaints.   Cancer Staging Primary cancer of upper outer quadrant of right female breast [facility]) Staging form: Breast, AJCC 8th Edition - Clinical stage from 12/12/2018: Stage IB (cT2, cN0, cM0, G3, ER+, PR+, HER2+) - Signed by Lloyd Huger, MD on 01/11/2019 Stage prefix: Initial diagnosis Histologic grading system: 3 grade system HER2-IHC interpretation: Equivocal HER2-IHC  value: Score 2+ HER2-FISH interpretation: Positive   Jacquelin Hawking, NP   05/06/2020 9:20 AM

## 2020-05-20 ENCOUNTER — Ambulatory Visit: Payer: PPO | Admitting: Dermatology

## 2020-07-08 DIAGNOSIS — H903 Sensorineural hearing loss, bilateral: Secondary | ICD-10-CM | POA: Diagnosis not present

## 2020-07-08 DIAGNOSIS — H6123 Impacted cerumen, bilateral: Secondary | ICD-10-CM | POA: Diagnosis not present

## 2020-08-02 NOTE — Progress Notes (Signed)
[place]  Telephone:(336) [phone] Fax:(336) [phone]  ID: Dagoberto Ligas OB: 12/17/1927  MR#: [id number]  UYQ#:[id number]  Patient Care Team: Kirk Ruths, MD as PCP - General (Internal Medicine) Rico Junker, RN as Registered Nurse Grayland Ormond Kathlene November, MD as Consulting Physician (Oncology) Rico Junker, RN as Registered Nurse Rico Junker, RN as Registered Nurse Lloyd Huger, MD as Consulting Physician (Hematology and Oncology) Noreene Filbert, MD as Referring Physician (Radiation Oncology) Robert Bellow, MD as Consulting Physician (General Surgery)   CHIEF COMPLAINT: Clinical stage IB triple positive invasive carcinoma of the upper outer quadrant of the right breast, 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  INTERVAL HISTORY: Patient returns to clinic today for routine 52-monthevaluation.  Letrozole was switched to tamoxifen secondary to osteoporosis and she is tolerating this treatment well without significant side effects.  She still is not taking Fosamax as prescribed, but states she is taking calcium and vitamin D supplementation.  She currently feels well and is asymptomatic. She has no neurologic complaints.  She denies any recent fevers or illnesses.  She has a good appetite and denies weight loss.  She has no chest pain, shortness of breath, cough, or hemoptysis.  She denies any nausea, vomiting, constipation, or diarrhea.  She has no urinary complaints.  Patient offers no specific complaints today.  REVIEW OF SYSTEMS:   Review of Systems  Constitutional: Negative.  Negative for fever, malaise/fatigue and weight loss.  Respiratory: Negative.  Negative for cough, hemoptysis and shortness of breath.   Cardiovascular: Negative.  Negative for chest pain and leg swelling.  Gastrointestinal: Negative.  Negative for abdominal pain.  Genitourinary: Negative.  Negative for dysuria.  Musculoskeletal: Negative.   Negative for back pain.  Skin: Negative.  Negative for rash.  Neurological: Negative.  Negative for dizziness, focal weakness, weakness and headaches.  Psychiatric/Behavioral: Negative.  The patient is not nervous/anxious.     As per HPI. Otherwise, a complete review of systems is negative.  PAST MEDICAL HISTORY: Past Medical History:  Diagnosis Date  . Asthma   . Basal cell carcinoma 06/29/2016   L lat chin  . [facility]   right breast  . [place]   [place]  . [place]   Left breast pre cancerous- mastectomy with reconstruction  . Hypertension   . Personal history of chemotherapy     PAST SURGICAL HISTORY: Past Surgical History:  Procedure Laterality Date  . AUGMENTATION MAMMAPLASTY Left 1999  . BREAST BIOPSY Right 11/30/2018   affirm stereo bx of calcs, x marker, path pending  . BREAST BIOPSY Right 11/30/2018   affirm bx of calcs, ribbon marker,path pending  . BREAST LUMPECTOMY Left 12/16/2018  . BREAST LUMPECTOMY WITH NEEDLE LOCALIZATION AND AXILLARY SENTINEL LYMPH NODE BX Right 12/16/2018   Procedure: BREAST LUMPECTOMY WITH NEEDLE LOCALIZATION AND AXILLARY SENTINEL LYMPH NODE BX;  Surgeon: BRobert Bellow MD;  Location: [facility] ORS;  Service: General;  Laterality: Right;  . KIDNEY CYST REMOVAL    . MASTECTOMY Left 1999   Implant  . REDUCTION MAMMAPLASTY Right 1997    FAMILY HISTORY: Family History  Problem Relation Age of Onset  . Breast cancer Neg Hx     ADVANCED DIRECTIVES (Y/N):  N  HEALTH MAINTENANCE: Social History   Tobacco Use  . Smoking status: Never Smoker  . Smokeless tobacco: Never Used  Vaping Use  . Vaping Use: Never used  Substance Use Topics  . Alcohol use: Never  . Drug use: Never     Colonoscopy:  PAP:  Bone density:  Lipid panel:  Allergies  Allergen Reactions  . Cephalexin Other (See Comments)    Possible headache     Current Outpatient Medications  Medication Sig Dispense  Refill  . ADVAIR DISKUS 250-50 MCG/DOSE AEPB Inhale 1 puff into the lungs every 12 (twelve) hours.    . Biotin 5 MG CAPS Take 5 mg by mouth daily.    . Cholecalciferol 25 MCG (1000 UT) tablet Take 1,000 Units by mouth daily.    Marland Kitchen letrozole (FEMARA) 2.5 MG tablet TAKE 1 TABLET BY MOUTH DAILY 90 tablet 3  . Multiple Vitamins-Minerals (MULTIVITAL PLATINUM SILVER PO) Take 1 tablet by mouth daily.    . Multiple Vitamins-Minerals (PRESERVISION AREDS) CAPS Take 1 capsule by mouth 2 (two) times daily.    . potassium chloride (K-DUR,KLOR-CON) 10 MEQ tablet Take 10 mEq by mouth 2 (two) times daily.    Marland Kitchen PROAIR HFA 108 (90 Base) MCG/ACT inhaler Inhale 2 puffs into the lungs every 4 (four) hours as needed for wheezing.    . tamoxifen (NOLVADEX) 10 MG tablet Take 1 tablet (10 mg total) by mouth 2 (two) times daily. 120 tablet 3  . alendronate (FOSAMAX) 70 MG tablet Take 1 tablet (70 mg total) by mouth once a week. Take with a full glass of water on an empty stomach. (Patient not taking: No sig reported) 12 tablet 3  . mometasone (NASONEX) 50 MCG/ACT nasal spray Place into the nose.    . trastuzumab-dkst 2 mg/kg in sodium chloride 0.9 % 250 mL Inject 2 mg/kg into the vein once. (Patient not taking: No sig reported)     No current facility-administered medications for this visit.    OBJECTIVE: Vitals:   08/06/20 1035  BP: (!) 148/73  Pulse: 75  Resp: 20  Temp: 98.9 F (37.2 C)  SpO2: 97%     Body mass index is 21.73 kg/m.    ECOG FS:0 - Asymptomatic  General: Well-developed, well-nourished, no acute distress. Eyes: Pink conjunctiva, anicteric sclera. HEENT: Normocephalic, moist mucous membranes. Lungs: No audible wheezing or coughing. Heart: Regular rate and rhythm. Abdomen: Soft, nontender, no obvious distention. Musculoskeletal: No edema, cyanosis, or clubbing. Neuro: Alert, answering all questions appropriately. Cranial nerves grossly intact. Skin: No rashes or petechiae noted. Psych:  Normal affect.  LAB RESULTS:  Lab Results  Component Value Date   NA 139 02/06/2020   K 4.6 02/06/2020   CL 100 02/06/2020   CO2 31 02/06/2020   GLUCOSE 93 02/06/2020   BUN 21 02/06/2020   CREATININE 0.90 02/06/2020   CALCIUM 9.8 02/06/2020   PROT 6.2 (L) 02/06/2020   ALBUMIN 4.0 02/06/2020   AST 22 02/06/2020   ALT 22 02/06/2020   ALKPHOS 48 02/06/2020   BILITOT 0.7 02/06/2020   GFRNONAA >60 02/06/2020   GFRAA >60 11/07/2019    Lab Results  Component Value Date   WBC 6.9 02/06/2020   NEUTROABS 5.0 02/06/2020   HGB 13.1 02/06/2020   HCT 39.9 02/06/2020   MCV 91.5 02/06/2020   PLT 232 02/06/2020     STUDIES: No results found.  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  PLAN:    1. 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  Although guidelines recommend reexcision given her close margins, the benefit of this is unclear given patient's advanced age and she ultimately chose not to undergo resection.  She completed adjuvant XRT.  She declined adjuvant treatment with chemotherapy, but wanted to pursue single agent Orgivri which she completed on February 27, 2020.  She was initially started on letrozole, but this was transitioned to tamoxifen secondary to osteoporosis.  She will complete 5 years of treatment in January 2026.  Given her high risk disease, will consider extending treatment. Her most recent mammogram on December 05, 2019 was reported as BI-RADS 2.  Repeat in October 2022.  Return to clinic in 6 months for routine evaluation. 2.  Osteoporosis: Patient's bone mineral density on March 21, 2020 revealed a T score of -3.5.  This is worse than 1 year prior where her T  score was reported -2.9.  Letrozole was discontinued for tamoxifen as above.  Patient states that she is not taking Fosamax as prescribed and was encouraged to do so.  She does report taking calcium and vitamin D supplementation.  Repeat bone mineral density in January 2023.   3.  Hypertension: Chronic and unchanged.  Continue follow-up and treatment per primary care.   Patient expressed understanding and was in agreement with this plan. She also understands that She can call clinic at any time with any questions, concerns, or complaints.   Cancer Staging Primary cancer of upper outer quadrant of right female breast [facility]) Staging form: Breast, AJCC 8th Edition - Clinical stage from 12/12/2018: Stage IB (cT2, cN0, cM0, G3, ER+, PR+, HER2+) - Signed by Lloyd Huger, MD on 01/11/2019 Stage prefix: Initial diagnosis Histologic grading system: 3 grade system HER2-IHC interpretation: Equivocal HER2-IHC value: Score 2+ HER2-FISH interpretation: Positive   Lloyd Huger, MD   08/06/2020 12:22 PM

## 2020-08-06 ENCOUNTER — Encounter: Payer: Self-pay | Admitting: Oncology

## 2020-08-06 ENCOUNTER — Other Ambulatory Visit: Payer: Self-pay

## 2020-08-06 ENCOUNTER — Inpatient Hospital Stay: Payer: PPO | Attending: Oncology | Admitting: Oncology

## 2020-08-06 ENCOUNTER — Other Ambulatory Visit: Payer: Self-pay | Admitting: Oncology

## 2020-08-06 VITALS — BP 148/73 | HR 75 | Temp 98.9°F | Resp 20 | Wt 126.6 lb

## 2020-08-06 DIAGNOSIS — Z170 Estrogen receptor positive status [ER+]: Secondary | ICD-10-CM | POA: Diagnosis not present

## 2020-08-06 DIAGNOSIS — Z923 Personal history of irradiation: Secondary | ICD-10-CM | POA: Diagnosis not present

## 2020-08-06 DIAGNOSIS — C50411 Malignant neoplasm of upper-outer quadrant of right female breast: Secondary | ICD-10-CM

## 2020-08-06 DIAGNOSIS — I10 Essential (primary) hypertension: Secondary | ICD-10-CM | POA: Diagnosis not present

## 2020-08-06 DIAGNOSIS — M810 Age-related osteoporosis without current pathological fracture: Secondary | ICD-10-CM | POA: Insufficient documentation

## 2020-08-09 ENCOUNTER — Other Ambulatory Visit: Payer: Self-pay

## 2020-08-09 ENCOUNTER — Ambulatory Visit (INDEPENDENT_AMBULATORY_CARE_PROVIDER_SITE_OTHER): Payer: PPO | Admitting: Dermatology

## 2020-08-09 DIAGNOSIS — L821 Other seborrheic keratosis: Secondary | ICD-10-CM

## 2020-08-09 DIAGNOSIS — L578 Other skin changes due to chronic exposure to nonionizing radiation: Secondary | ICD-10-CM | POA: Diagnosis not present

## 2020-08-09 DIAGNOSIS — L820 Inflamed seborrheic keratosis: Secondary | ICD-10-CM | POA: Diagnosis not present

## 2020-08-09 NOTE — Patient Instructions (Addendum)
If you have any questions or concerns for your doctor, please call our main line at [phone] and press option 4 to reach your doctor's medical assistant. If no one answers, please leave a voicemail as directed and we will return your call as soon as possible. Messages left after 4 pm will be answered the following business day.   You may also send [place] a message via [place]. We typically respond to MyChart messages within 1-2 business days.  For prescription refills, please ask your pharmacy to contact our office. Our fax number is [phone].  If you have an urgent issue when the clinic is closed that cannot wait until the next business day, you can page your doctor at the number below.    Please note that while we do our best to be available for urgent issues outside of office hours, we are not available 24/7.   If you have an urgent issue and are unable to reach [place], you may choose to seek medical care at your doctor's office, retail clinic, urgent care center, or emergency room.  If you have a medical emergency, please immediately call 911 or go to the emergency department.  Pager Numbers  - Dr. Nehemiah Massed: [phone]  - Dr. Laurence Ferrari: [phone]  - Dr. Nicole Kindred: [phone]  In the event of inclement weather, please call our main line at [phone] for an update on the status of any delays or closures.  Dermatology Medication Tips: Please keep the boxes that topical medications come in in order to help keep track of the instructions about where and how to use these. Pharmacies typically print the medication instructions only on the boxes and not directly on the medication tubes.   If your medication is too expensive, please contact our office at [phone] option 4 or send [place] a message through [place].   We are unable to tell what your co-pay for medications will be in advance as this is different depending on your insurance coverage. However, we may be able to find a substitute  medication at lower cost or fill out paperwork to get insurance to cover a needed medication.   If a prior authorization is required to get your medication covered by your insurance company, please allow [place] 1-2 business days to complete this process.  Drug prices often vary depending on where the prescription is filled and some pharmacies may offer cheaper prices.  The website www.goodrx.com contains coupons for medications through different pharmacies. The prices here do not account for what the cost may be with help from insurance (it may be cheaper with your insurance), but the website can give you the price if you did not use any insurance.  - You can print the associated coupon and take it with your prescription to the pharmacy.  - You may also stop by our office during regular business hours and pick up a GoodRx coupon card.  - If you need your prescription sent electronically to a different pharmacy, notify our office through [facility] or by phone at [phone] option 4.   Seborrheic Keratosis  What causes seborrheic keratoses? Seborrheic keratoses are harmless, common skin growths that first appear during adult life.  As time goes by, more growths appear.  Some people may develop a large number of them.  Seborrheic keratoses appear on both covered and uncovered body parts.  They are not caused by sunlight.  The tendency to develop seborrheic keratoses can be inherited.  They vary in color from skin-colored to  gray, brown, or even black.  They can be either smooth or have a rough, warty surface.   Seborrheic keratoses are superficial and look as if they were stuck on the skin.  Under the microscope this type of keratosis looks like layers upon layers of skin.  That is why at times the top layer may seem to fall off, but the rest of the growth remains and re-grows.    Treatment Seborrheic keratoses do not need to be treated, but can easily be removed in the office.  Seborrheic  keratoses often cause symptoms when they rub on clothing or jewelry.  Lesions can be in the way of shaving.  If they become inflamed, they can cause itching, soreness, or burning.  Removal of a seborrheic keratosis can be accomplished by freezing, burning, or surgery. If any spot bleeds, scabs, or grows rapidly, please return to have it checked, as these can be an indication of a skin cancer.

## 2020-08-09 NOTE — Progress Notes (Signed)
   Follow-Up Visit   Subjective  Hayley Nolan is a 87 y.o. female who presents for the following: chec spot (L leg, not sure how long it has been there).  The following portions of the chart were reviewed this encounter and updated as appropriate:   Tobacco  Allergies  Meds  Problems  Med Hx  Surg Hx  Fam Hx      Review of Systems:  No other skin or systemic complaints except as noted in HPI or Assessment and Plan.  Objective  Well appearing patient in no apparent distress; mood and affect are within normal limits.  A focused examination was performed including L lower leg. Relevant physical exam findings are noted in the Assessment and Plan.  bil lower legs x 19, Total = 19 (19) Erythematous keratotic or waxy stuck-on papule or plaque.    Assessment & Plan  Inflamed seborrheic keratosis bil lower legs x 19, Total = 19  RTC if not resolved in 4 weeks  Destruction of lesion - bil lower legs x 19, Total = 19 Complexity: simple   Destruction method: cryotherapy   Informed consent: discussed and consent obtained   Timeout:  patient name, date of birth, surgical site, and procedure verified Lesion destroyed using liquid nitrogen: Yes   Region frozen until ice ball extended beyond lesion: Yes   Outcome: patient tolerated procedure well with no complications   Post-procedure details: wound care instructions given    Actinic Damage - chronic, secondary to cumulative UV radiation exposure/sun exposure over time - diffuse scaly erythematous macules with underlying dyspigmentation - Recommend daily broad spectrum sunscreen SPF 30+ to sun-exposed areas, reapply every 2 hours as needed.  - Recommend staying in the shade or wearing long sleeves, sun glasses (UVA+UVB protection) and wide brim hats (4-inch brim around the entire circumference of the hat). - Call for new or changing lesions.  Seborrheic Keratoses - Stuck-on, waxy, tan-brown papules and/or plaques  -  Benign-appearing - Discussed benign etiology and prognosis. - Observe - Call for any changes  Return in about 3 months (around 11/09/2020), or if symptoms worsen or fail to improve, for TBSE, Hx of BCC.  I, Othelia Pulling, RMA, am acting as scribe for Sarina Ser, MD . Documentation: I have reviewed the above documentation for accuracy and completeness, and I agree with the above.  Sarina Ser, MD

## 2020-08-12 ENCOUNTER — Telehealth: Payer: Self-pay | Admitting: *Deleted

## 2020-08-12 NOTE — Telephone Encounter (Signed)
Daughter called stating that patient was on Letrozole and was switched to Tamoxifen, but now patient has it in her mind that she is to be taking both Letrozole and Tamoxifen. Daughter requests a return call to discuss exactly what medicine the patient should be taking. It could be that both medications are still listed on her medicine list though the Letrozole has not been refill since November 2021

## 2020-08-12 NOTE — Telephone Encounter (Signed)
Letrozole discontinued from patient medicine list and I called daughter and let her know that she should only be on the Tamoxifen and that patient may have seen both medications listed on her AVS and apologized for the confusion on our end and let her know that this is now corrected. She thanked me for letting her know and confirming what she thought was correct in that she should only be on Tamoxifen.

## 2020-08-13 ENCOUNTER — Encounter: Payer: Self-pay | Admitting: Dermatology

## 2020-08-15 ENCOUNTER — Ambulatory Visit: Payer: PPO | Admitting: Dermatology

## 2020-08-26 ENCOUNTER — Encounter: Payer: Self-pay | Admitting: Radiation Oncology

## 2020-08-26 ENCOUNTER — Ambulatory Visit
Admission: RE | Admit: 2020-08-26 | Discharge: 2020-08-26 | Disposition: A | Discharge status: Home / Self Care | Payer: PPO | Source: Ambulatory Visit | Attending: Radiation Oncology | Admitting: Radiation Oncology

## 2020-08-26 ENCOUNTER — Other Ambulatory Visit: Payer: Self-pay

## 2020-08-26 VITALS — BP 158/76 | HR 88 | Temp 97.4°F | Resp 18 | Wt 126.6 lb

## 2020-08-26 DIAGNOSIS — C50411 Malignant neoplasm of upper-outer quadrant of right female breast: Secondary | ICD-10-CM | POA: Diagnosis not present

## 2020-08-26 DIAGNOSIS — Z170 Estrogen receptor positive status [ER+]: Secondary | ICD-10-CM | POA: Diagnosis not present

## 2020-08-26 DIAGNOSIS — Z79810 Long term (current) use of selective estrogen receptor modulators (SERMs): Secondary | ICD-10-CM | POA: Insufficient documentation

## 2020-08-26 DIAGNOSIS — Z923 Personal history of irradiation: Secondary | ICD-10-CM | POA: Diagnosis not present

## 2020-08-26 DIAGNOSIS — Z853 Personal history of malignant neoplasm of breast: Secondary | ICD-10-CM | POA: Diagnosis not present

## 2020-08-26 NOTE — Progress Notes (Signed)
Radiation Oncology Follow up Note  Name: Hayley Nolan   Date:   08/26/2020 MRN:  [id number] DOB: [birth year]    This 87 y.o. female presents to the clinic today for 84-month follow-up status post whole breast radiation to her right breast status post wide local excision for stage Ia ER PR positive invasive mammary carcinoma.  REFERRING PROVIDER: Kirk Ruths, MD  HPI: Patient is a 87 year old female now about 18 months having completed whole breast radiation to her right breast status post wide local excision for stage Ia ER positive invasive mammary carcinoma seen today in routine follow-up she is doing well specifically denies breast tenderness cough or bone pain.  She had a mammogram.  Back in October which I reviewed was BI-RADS 1 2 benign.  She is currently on tamoxifen tolerating it well without side effect.  COMPLICATIONS OF TREATMENT: none  FOLLOW UP COMPLIANCE: keeps appointments   PHYSICAL EXAM:  BP (!) 158/76 (BP Location: Left Arm, Patient Position: Sitting)   Pulse 88   Temp (!) 97.4 F (36.3 C) (Tympanic)   Resp 18   Wt 126 lb 9.6 oz (57.4 kg)   BMI 21.73 kg/m  Patient has had bilateral breast surgery with some asymmetry secondary to her left breast being cosmetically poor quality result.  No dominant masses noted in either breast.  No axillary or supraclavicular adenopathy is appreciated.  Well-developed well-nourished patient in NAD. HEENT reveals PERLA, EOMI, discs not visualized.  Oral cavity is clear. No oral mucosal lesions are identified. Neck is clear without evidence of cervical or supraclavicular adenopathy. Lungs are clear to A&P. Cardiac examination is essentially unremarkable with regular rate and rhythm without murmur rub or thrill. Abdomen is benign with no organomegaly or masses noted. Motor sensory and DTR levels are equal and symmetric in the upper and lower extremities. Cranial nerves II through XII are grossly intact. Proprioception is intact. No  peripheral adenopathy or edema is identified. No motor or sensory levels are noted. Crude visual fields are within normal range.  RADIOLOGY RESULTS: Mammograms reviewed compatible with above-stated findings  PLAN: Present time patient continues to do well 18 months out with no evidence of disease.  She continues on tamoxifen without side effect.  I have asked to see her back in 1 year for follow-up.  Patient knows to call with any concerns.  I would like to take this opportunity to thank you for allowing me to participate in the care of your patient.Noreene Filbert, MD

## 2020-08-27 DIAGNOSIS — E7800 Pure hypercholesterolemia, unspecified: Secondary | ICD-10-CM | POA: Diagnosis not present

## 2020-08-27 DIAGNOSIS — N1831 Chronic kidney disease, stage 3a: Secondary | ICD-10-CM | POA: Diagnosis not present

## 2020-08-27 DIAGNOSIS — I129 Hypertensive chronic kidney disease with stage 1 through stage 4 chronic kidney disease, or unspecified chronic kidney disease: Secondary | ICD-10-CM | POA: Diagnosis not present

## 2020-09-03 DIAGNOSIS — N1831 Chronic kidney disease, stage 3a: Secondary | ICD-10-CM | POA: Diagnosis not present

## 2020-09-03 DIAGNOSIS — I129 Hypertensive chronic kidney disease with stage 1 through stage 4 chronic kidney disease, or unspecified chronic kidney disease: Secondary | ICD-10-CM | POA: Diagnosis not present

## 2020-09-03 DIAGNOSIS — J4521 Mild intermittent asthma with (acute) exacerbation: Secondary | ICD-10-CM | POA: Diagnosis not present

## 2020-09-03 DIAGNOSIS — E7800 Pure hypercholesterolemia, unspecified: Secondary | ICD-10-CM | POA: Diagnosis not present

## 2020-09-11 ENCOUNTER — Ambulatory Visit: Payer: PPO | Admitting: Dermatology

## 2020-11-27 ENCOUNTER — Ambulatory Visit: Payer: PPO | Admitting: Dermatology

## 2020-11-27 ENCOUNTER — Other Ambulatory Visit: Payer: Self-pay

## 2020-11-27 DIAGNOSIS — D229 Melanocytic nevi, unspecified: Secondary | ICD-10-CM | POA: Diagnosis not present

## 2020-11-27 DIAGNOSIS — L821 Other seborrheic keratosis: Secondary | ICD-10-CM

## 2020-11-27 DIAGNOSIS — Z1283 Encounter for screening for malignant neoplasm of skin: Secondary | ICD-10-CM

## 2020-11-27 DIAGNOSIS — L814 Other melanin hyperpigmentation: Secondary | ICD-10-CM | POA: Diagnosis not present

## 2020-11-27 DIAGNOSIS — L820 Inflamed seborrheic keratosis: Secondary | ICD-10-CM

## 2020-11-27 DIAGNOSIS — D1800 Hemangioma unspecified site: Secondary | ICD-10-CM | POA: Diagnosis not present

## 2020-11-27 DIAGNOSIS — Z85828 Personal history of other malignant neoplasm of skin: Secondary | ICD-10-CM | POA: Diagnosis not present

## 2020-11-27 DIAGNOSIS — L578 Other skin changes due to chronic exposure to nonionizing radiation: Secondary | ICD-10-CM | POA: Diagnosis not present

## 2020-11-27 NOTE — Patient Instructions (Signed)

## 2020-11-27 NOTE — Progress Notes (Signed)
Follow-Up Visit   Subjective  Hayley Nolan is a 87 y.o. female who presents for the following: Annual Exam (Mole check ). The patient presents for Total-Body Skin Exam (TBSE) for skin cancer screening and mole check.   The following portions of the chart were reviewed this encounter and updated as appropriate:   Tobacco  Allergies  Meds  Problems  Med Hx  Surg Hx  Fam Hx     Review of Systems:  No other skin or systemic complaints except as noted in HPI or Assessment and Plan.  Objective  Well appearing patient in no apparent distress; mood and affect are within normal limits.  A full examination was performed including scalp, head, eyes, ears, nose, lips, neck, chest, axillae, abdomen, back, buttocks, bilateral upper extremities, bilateral lower extremities, hands, feet, fingers, toes, fingernails, and toenails. All findings within normal limits unless otherwise noted below.  face x 4, legs x 6, left anterior thigh x 2, right anterior thigh x 3  (15) (15) Erythematous keratotic or waxy stuck-on papule or plaque.    Assessment & Plan  Inflamed seborrheic keratosis face x 4, legs x 6, left anterior thigh x 2, right anterior thigh x 3  (15)  Destruction of lesion - face x 4, legs x 6, left anterior thigh x 2, right anterior thigh x 3  (15) Complexity: simple   Destruction method: cryotherapy   Informed consent: discussed and consent obtained   Timeout:  patient name, date of birth, surgical site, and procedure verified Lesion destroyed using liquid nitrogen: Yes   Region frozen until ice ball extended beyond lesion: Yes   Outcome: patient tolerated procedure well with no complications   Post-procedure details: wound care instructions given    Skin cancer screening  Lentigines - Scattered tan macules - Due to sun exposure - Benign-appearing, observe - Recommend daily broad spectrum sunscreen SPF 30+ to sun-exposed areas, reapply every 2 hours as needed. - Call  for any changes  Seborrheic Keratoses - Stuck-on, waxy, tan-brown papules and/or plaques  - Benign-appearing - Discussed benign etiology and prognosis. - Observe - Call for any changes  Melanocytic Nevi - Tan-brown and/or pink-flesh-colored symmetric macules and papules - Benign appearing on exam today - Observation - Call clinic for new or changing moles - Recommend daily use of broad spectrum spf 30+ sunscreen to sun-exposed areas.   Hemangiomas - Red papules - Discussed benign nature - Observe - Call for any changes  Actinic Damage - Chronic condition, secondary to cumulative UV/sun exposure - diffuse scaly erythematous macules with underlying dyspigmentation - Recommend daily broad spectrum sunscreen SPF 30+ to sun-exposed areas, reapply every 2 hours as needed.  - Staying in the shade or wearing long sleeves, sun glasses (UVA+UVB protection) and wide brim hats (4-inch brim around the entire circumference of the hat) are also recommended for sun protection.  - Call for new or changing lesions.  History of Basal Cell Carcinoma of the Skin Left lateral chin - No evidence of recurrence today - Recommend regular full body skin exams - Recommend daily broad spectrum sunscreen SPF 30+ to sun-exposed areas, reapply every 2 hours as needed.  - Call if any new or changing lesions are noted between office visits   Skin cancer screening performed today.   Return in about 1 year (around 11/27/2021) for TBSE, hx of BCC .  Marta Lamas, CMA, am acting as scribe for Sarina Ser, MD .  Documentation: I have reviewed the above  documentation for accuracy and completeness, and I agree with the above.  Sarina Ser, MD

## 2020-11-29 ENCOUNTER — Encounter: Payer: Self-pay | Admitting: Dermatology

## 2020-12-05 ENCOUNTER — Ambulatory Visit
Admission: RE | Admit: 2020-12-05 | Discharge: 2020-12-05 | Disposition: A | Discharge status: Home / Self Care | Payer: PPO | Source: Ambulatory Visit | Attending: Oncology | Admitting: Oncology

## 2020-12-05 ENCOUNTER — Other Ambulatory Visit: Payer: Self-pay | Admitting: Oncology

## 2020-12-05 ENCOUNTER — Other Ambulatory Visit: Payer: Self-pay

## 2020-12-05 DIAGNOSIS — C50911 Malignant neoplasm of unspecified site of right female breast: Secondary | ICD-10-CM | POA: Diagnosis not present

## 2020-12-05 DIAGNOSIS — R928 Other abnormal and inconclusive findings on diagnostic imaging of breast: Secondary | ICD-10-CM

## 2020-12-05 DIAGNOSIS — R922 Inconclusive mammogram: Secondary | ICD-10-CM | POA: Diagnosis not present

## 2020-12-05 DIAGNOSIS — Z853 Personal history of malignant neoplasm of breast: Secondary | ICD-10-CM | POA: Diagnosis not present

## 2020-12-12 ENCOUNTER — Other Ambulatory Visit: Payer: Self-pay | Admitting: Oncology

## 2020-12-17 DIAGNOSIS — Z853 Personal history of malignant neoplasm of breast: Secondary | ICD-10-CM | POA: Diagnosis not present

## 2021-01-09 DIAGNOSIS — H6123 Impacted cerumen, bilateral: Secondary | ICD-10-CM | POA: Diagnosis not present

## 2021-01-09 DIAGNOSIS — H903 Sensorineural hearing loss, bilateral: Secondary | ICD-10-CM | POA: Diagnosis not present

## 2021-02-07 ENCOUNTER — Other Ambulatory Visit: Payer: Self-pay

## 2021-02-07 ENCOUNTER — Inpatient Hospital Stay: Payer: PPO | Attending: Nurse Practitioner | Admitting: Nurse Practitioner

## 2021-02-07 VITALS — BP 123/72 | HR 61 | Temp 96.8°F | Resp 16 | Wt 128.6 lb

## 2021-02-07 DIAGNOSIS — Z08 Encounter for follow-up examination after completed treatment for malignant neoplasm: Secondary | ICD-10-CM | POA: Diagnosis not present

## 2021-02-07 DIAGNOSIS — Z923 Personal history of irradiation: Secondary | ICD-10-CM | POA: Diagnosis not present

## 2021-02-07 DIAGNOSIS — M810 Age-related osteoporosis without current pathological fracture: Secondary | ICD-10-CM | POA: Diagnosis not present

## 2021-02-07 DIAGNOSIS — D0511 Intraductal carcinoma in situ of right breast: Secondary | ICD-10-CM | POA: Insufficient documentation

## 2021-02-07 DIAGNOSIS — Z170 Estrogen receptor positive status [ER+]: Secondary | ICD-10-CM | POA: Insufficient documentation

## 2021-02-07 DIAGNOSIS — C50411 Malignant neoplasm of upper-outer quadrant of right female breast: Secondary | ICD-10-CM | POA: Diagnosis not present

## 2021-02-07 DIAGNOSIS — Z853 Personal history of malignant neoplasm of breast: Secondary | ICD-10-CM

## 2021-02-07 DIAGNOSIS — I10 Essential (primary) hypertension: Secondary | ICD-10-CM | POA: Insufficient documentation

## 2021-02-07 NOTE — Progress Notes (Signed)
[place]  Telephone:([phone] Fax:([phone]) [phone]  ID: Dagoberto Ligas OB: 12/12/1927  MR#: [id number]  IDC#:[id number]  Patient Care Team: Kirk Ruths, MD as PCP - General (Internal Medicine) Rico Junker, RN as Registered Nurse Grayland Ormond Kathlene November, MD as Consulting Physician (Oncology) Rico Junker, RN as Registered Nurse Rico Junker, RN as Registered Nurse Lloyd Huger, MD as Consulting Physician (Hematology and Oncology) Noreene Filbert, MD as Referring Physician (Radiation Oncology) Robert Bellow, MD as Consulting Physician (General Surgery)   CHIEF COMPLAINT: Clinical stage IB triple positive invasive carcinoma of the upper outer quadrant of the right breast, 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  INTERVAL HISTORY: Patient returns to clinic for routine surveillance of breast cancer. She continues tamoxifen secondary to osteoporosis. Tolerating well without significant side effects. She is taking fosamax weekly and tolerating well. Continues calciuma nd vitamin d. She feels well and is asymptomatic. No new lumps, bumps, or breast pain. She has no neurologic complaints.  She denies any recent fevers or illnesses.  She has a good appetite and denies weight loss.  She has no chest pain, shortness of breath, cough, or hemoptysis.  She denies any nausea, vomiting, constipation, or diarrhea.  She has no urinary complaints.  Patient offers no specific complaints today.  REVIEW OF SYSTEMS:   Review of Systems  Constitutional: Negative.  Negative for fever, malaise/fatigue and weight loss.  Respiratory: Negative.  Negative for cough, hemoptysis and shortness of breath.   Cardiovascular: Negative.  Negative for chest pain and leg swelling.  Gastrointestinal: Negative.  Negative for abdominal pain.  Genitourinary: Negative.  Negative for dysuria.  Musculoskeletal: Negative.  Negative for back pain.  Skin:  Negative.  Negative for rash.  Neurological: Negative.  Negative for dizziness, focal weakness, weakness and headaches.  Psychiatric/Behavioral: Negative.  The patient is not nervous/anxious.   As per HPI. Otherwise, a complete review of systems is negative.  PAST MEDICAL HISTORY: Past Medical History:  Diagnosis Date   Asthma    Basal cell carcinoma 06/29/2016   L lat chin nodular pattern   Breast cancer ([place]) 1999   right breast   Breast cancer ([place]) 12/2018   left breast   Cancer ([place]) 1999   Left breast pre cancerous- mastectomy with reconstruction   Hypertension    Personal history of chemotherapy     PAST SURGICAL HISTORY: Past Surgical History:  Procedure Laterality Date   AUGMENTATION MAMMAPLASTY Left 1999   BREAST BIOPSY Right 11/30/2018   affirm stereo bx of calcs, x marker, path pending   BREAST BIOPSY Right 11/30/2018   affirm bx of calcs, ribbon marker,path pending   BREAST LUMPECTOMY Left 12/16/2018   BREAST LUMPECTOMY WITH NEEDLE LOCALIZATION AND AXILLARY SENTINEL LYMPH NODE BX Right 12/16/2018   Procedure: BREAST LUMPECTOMY WITH NEEDLE LOCALIZATION AND AXILLARY SENTINEL LYMPH NODE BX;  Surgeon: Robert Bellow, MD;  Location: [facility] ORS;  Service: General;  Laterality: Right;   KIDNEY CYST REMOVAL     MASTECTOMY Left 1999   Implant   REDUCTION MAMMAPLASTY Right 1997    FAMILY HISTORY: Family History  Problem Relation Age of Onset   Breast cancer Neg Hx     ADVANCED DIRECTIVES (Y/N):  N  HEALTH MAINTENANCE: Social History   Tobacco Use   Smoking status: Never   Smokeless tobacco: Never  Vaping Use   Vaping Use: Never used  Substance Use Topics   Alcohol use: Never  Drug use: Never     Colonoscopy:  PAP:  Bone density:  Lipid panel:  Allergies  Allergen Reactions   Cephalexin Other (See Comments)    Possible headache     Current Outpatient Medications  Medication Sig Dispense Refill   ADVAIR DISKUS 250-50 MCG/DOSE AEPB Inhale 1  puff into the lungs every 12 (twelve) hours.     alendronate (FOSAMAX) 70 MG tablet TAKE 1 TABLET EVERY 7 DAYS WITH A FULL GLASS OF WATER ON AN EMPTY STOMACH DO NOT LIE DOWN FOR AT LEAST 30 MIN 12 tablet 3   Biotin 5 MG CAPS Take 5 mg by mouth daily.     Cholecalciferol 25 MCG (1000 UT) tablet Take 1,000 Units by mouth daily.     Multiple Vitamins-Minerals (MULTIVITAL PLATINUM SILVER PO) Take 1 tablet by mouth daily.     Multiple Vitamins-Minerals (PRESERVISION AREDS) CAPS Take 1 capsule by mouth 2 (two) times daily.     potassium chloride (K-DUR,KLOR-CON) 10 MEQ tablet Take 10 mEq by mouth 2 (two) times daily.     PROAIR HFA 108 (90 Base) MCG/ACT inhaler Inhale 2 puffs into the lungs every 4 (four) hours as needed for wheezing.     spironolactone (ALDACTONE) 25 MG tablet Take 25 mg by mouth daily.     tamoxifen (NOLVADEX) 10 MG tablet TAKE ONE (1) TABLET BY MOUTH TWO TIMES PER DAY 120 tablet 3   trastuzumab-dkst 2 mg/kg in sodium chloride 0.9 % 250 mL Inject 2 mg/kg into the vein once.     mometasone (NASONEX) 50 MCG/ACT nasal spray Place into the nose.     No current facility-administered medications for this visit.    OBJECTIVE: Vitals:   02/07/21 1013  BP: 123/72  Pulse: 61  Resp: 16  Temp: (!) 96.8 F (36 C)  SpO2: 97%     Body mass index is 22.07 kg/m.    ECOG FS:0 - Asymptomatic  General: Well-developed, well-nourished, no acute distress. In wheelchair. Daughter accompanies.  Eyes: Pink conjunctiva, anicteric sclera. HEENT: Normocephalic, moist mucous membranes. Lungs: No audible wheezing or coughing. Left lower lobe diminished breath sounds Heart: Regular rate and rhythm. Abdomen: Soft, nontender, no obvious distention. Musculoskeletal: No edema, cyanosis, or clubbing. Neuro: Alert, answering all questions appropriately. Cranial nerves grossly intact. Skin: No rashes or petechiae noted. Psych: Normal affect.  LAB RESULTS:  Lab Results  Component Value Date   NA 139  02/06/2020   K 4.6 02/06/2020   CL 100 02/06/2020   CO2 31 02/06/2020   GLUCOSE 93 02/06/2020   BUN 21 02/06/2020   CREATININE 0.90 02/06/2020   CALCIUM 9.8 02/06/2020   PROT 6.2 (L) 02/06/2020   ALBUMIN 4.0 02/06/2020   AST 22 02/06/2020   ALT 22 02/06/2020   ALKPHOS 48 02/06/2020   BILITOT 0.7 02/06/2020   GFRNONAA >60 02/06/2020   GFRAA >60 11/07/2019    Lab Results  Component Value Date   WBC 6.9 02/06/2020   NEUTROABS 5.0 02/06/2020   HGB 13.1 02/06/2020   HCT 39.9 02/06/2020   MCV 91.5 02/06/2020   PLT 232 02/06/2020     STUDIES: No results found.  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  PLAN:    1. 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  Although guidelines recommend reexcision given her close margins, the benefit of this is unclear given patient's advanced age and she ultimately chose not to undergo resection.  She completed adjuvant XRT.  She declined adjuvant treatment with chemotherapy, but wanted to pursue single agent Orgivri/trastuzumab-dkst which she completed on February 27, 2020.  She was initially started on letrozole, but this was transitioned to tamoxifen secondary to osteoporosis.  She will complete 5 years of treatment in January 2026.  Given her high risk disease, will consider extending treatment. Her most recent mammogram on December 05, 2020 was reported as BI-RADS 2.  Repeat in October 2023.  Return to clinic in 4-6 months for routine evaluation.  2.  Osteoporosis: Patient's bone mineral density on March 21, 2020 revealed a T score of -3.5.  This is worse than 1 year prior where her T score was reported -2.9.  Letrozole was discontinued for  tamoxifen as above.  She has started fosamax and is tolerating is well. Continues calcium 1200 mg and vitamin d 1000 iu daily. Repeat bone mineral density in January 2023.    3.  Hypertension: Chronic and unchanged.  Continue follow-up and treatment per primary care.  Patient expressed understanding and was in agreement with this plan. She also understands that She can call clinic at any time with any questions, concerns, or complaints.    Cancer Staging  Primary cancer of upper outer quadrant of right female breast [facility]) Staging form: Breast, AJCC 8th Edition - Clinical stage from 12/12/2018: Stage IB (cT2, cN0, cM0, G3, ER+, PR+, HER2+) - Signed by Lloyd Huger, MD on 01/11/2019 Stage prefix: Initial diagnosis Histologic grading system: 3 grade system HER2-IHC interpretation: Equivocal HER2-IHC value: Score 2+ HER2-FISH interpretation: Positive  Verlon Au, NP   02/07/2021

## 2021-02-07 NOTE — Progress Notes (Signed)
Pt has no concerns at this time. 

## 2021-02-12 DIAGNOSIS — H40053 Ocular hypertension, bilateral: Secondary | ICD-10-CM | POA: Diagnosis not present

## 2021-02-26 DIAGNOSIS — N1831 Chronic kidney disease, stage 3a: Secondary | ICD-10-CM | POA: Diagnosis not present

## 2021-02-26 DIAGNOSIS — I129 Hypertensive chronic kidney disease with stage 1 through stage 4 chronic kidney disease, or unspecified chronic kidney disease: Secondary | ICD-10-CM | POA: Diagnosis not present

## 2021-02-26 DIAGNOSIS — E7800 Pure hypercholesterolemia, unspecified: Secondary | ICD-10-CM | POA: Diagnosis not present

## 2021-03-05 DIAGNOSIS — I129 Hypertensive chronic kidney disease with stage 1 through stage 4 chronic kidney disease, or unspecified chronic kidney disease: Secondary | ICD-10-CM | POA: Diagnosis not present

## 2021-03-05 DIAGNOSIS — E7800 Pure hypercholesterolemia, unspecified: Secondary | ICD-10-CM | POA: Diagnosis not present

## 2021-03-05 DIAGNOSIS — C50919 Malignant neoplasm of unspecified site of unspecified female breast: Secondary | ICD-10-CM | POA: Diagnosis not present

## 2021-03-05 DIAGNOSIS — J4521 Mild intermittent asthma with (acute) exacerbation: Secondary | ICD-10-CM | POA: Diagnosis not present

## 2021-03-05 DIAGNOSIS — Z0000 Encounter for general adult medical examination without abnormal findings: Secondary | ICD-10-CM | POA: Diagnosis not present

## 2021-03-05 DIAGNOSIS — N1831 Chronic kidney disease, stage 3a: Secondary | ICD-10-CM | POA: Diagnosis not present

## 2021-03-25 ENCOUNTER — Ambulatory Visit
Admission: RE | Admit: 2021-03-25 | Discharge: 2021-03-25 | Disposition: A | Discharge status: Home / Self Care | Payer: PPO | Source: Ambulatory Visit | Attending: Nurse Practitioner | Admitting: Nurse Practitioner

## 2021-03-25 ENCOUNTER — Other Ambulatory Visit: Payer: Self-pay

## 2021-03-25 DIAGNOSIS — M810 Age-related osteoporosis without current pathological fracture: Secondary | ICD-10-CM | POA: Diagnosis not present

## 2021-03-25 DIAGNOSIS — Z853 Personal history of malignant neoplasm of breast: Secondary | ICD-10-CM | POA: Diagnosis not present

## 2021-03-25 DIAGNOSIS — M85851 Other specified disorders of bone density and structure, right thigh: Secondary | ICD-10-CM | POA: Diagnosis not present

## 2021-03-25 DIAGNOSIS — Z08 Encounter for follow-up examination after completed treatment for malignant neoplasm: Secondary | ICD-10-CM | POA: Diagnosis not present

## 2021-04-24 ENCOUNTER — Other Ambulatory Visit: Payer: Self-pay | Admitting: Oncology

## 2021-06-09 NOTE — Progress Notes (Signed)
?[place]  ?[phone] [phone] ? ?ID: Hayley Nolan OB: Oct 20, 1927  MR#: [id number]  [facility]#:[id number] ? ?Patient Care Team: ?Kirk Ruths, MD as PCP - General (Internal Medicine) ?Rico Junker, RN as Registered Nurse ?Lloyd Huger, MD as Consulting Physician (Oncology) ?Rico Junker, RN as Registered Nurse ?Rico Junker, RN as Registered Nurse ?Lloyd Huger, MD as Consulting Physician (Hematology and Oncology) ?Noreene Filbert, MD as Referring Physician (Radiation Oncology) ?Robert Bellow, MD as Consulting Physician (General Surgery) ? ? 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 ? ?INTERVAL HISTORY: Patient returns to clinic today for routine 21-monthevaluation.  She currently feels well and is asymptomatic.  She is tolerating tamoxifen and Fosamax without significant side effects.  She has no neurologic complaints.  She denies any recent fevers or illnesses.  She has a good appetite and denies weight loss.  She has no chest pain, shortness of breath, cough, or hemoptysis.  She denies any nausea, vomiting, constipation, or diarrhea.  She has no urinary complaints.  Patient offers no specific complaints today. ? ?REVIEW OF SYSTEMS:   ?Review of Systems  ?Constitutional: Negative.  Negative for fever, malaise/fatigue and weight loss.  ?Respiratory: Negative.  Negative for cough, hemoptysis and shortness of breath.   ?Cardiovascular: Negative.  Negative for chest pain and leg swelling.  ?Gastrointestinal: Negative.  Negative for abdominal pain.  ?Genitourinary: Negative.  Negative for dysuria.  ?Musculoskeletal: Negative.  Negative for back pain.  ?Skin: Negative.  Negative for rash.  ?Neurological: Negative.  Negative for dizziness, focal weakness, weakness and headaches.   ?Psychiatric/Behavioral: Negative.  The patient is not nervous/anxious.   ? ?As per HPI. Otherwise, a complete review of systems is negative. ? ?PAST MEDICAL HISTORY: ?Past Medical History:  ?Diagnosis Date  ? Asthma   ? Basal cell carcinoma 06/29/2016  ? L lat chin nodular pattern  ? [place]  [place]  ? [place]  ? [place]  ? [place]  ? Left breast pre cancerous- mastectomy with reconstruction  ? Hypertension   ? Personal history of chemotherapy   ? ? ?PAST SURGICAL HISTORY: ?Past Surgical History:  ?Procedure Laterality Date  ? AUGMENTATION MAMMAPLASTY Left 1999  ? BREAST BIOPSY Right 11/30/2018  ? affirm stereo bx of calcs, x marker, path pending  ? BREAST BIOPSY Right 11/30/2018  ? affirm bx of calcs, ribbon marker,path pending  ? BREAST LUMPECTOMY Left 12/16/2018  ? BREAST LUMPECTOMY WITH NEEDLE LOCALIZATION AND AXILLARY SENTINEL LYMPH NODE BX Right 12/16/2018  ? Procedure: BREAST LUMPECTOMY WITH NEEDLE LOCALIZATION AND AXILLARY SENTINEL LYMPH NODE BX;  Surgeon: BRobert Bellow MD;  Location: [facility] ORS;  Service: General;  Laterality: Right;  ? KIDNEY CYST REMOVAL    ? MASTECTOMY Left 1999  ? Implant  ? REDUCTION MAMMAPLASTY Right 1997  ? ? ?FAMILY HISTORY: ?Family History  ?Problem Relation Age of Onset  ? Breast cancer Neg Hx   ? ? ?ADVANCED DIRECTIVES (Y/N):  N ? ?HEALTH MAINTENANCE: ?Social History  ? ?Tobacco Use  ? Smoking status: Never  ? Smokeless tobacco: Never  ?Vaping Use  ? Vaping Use: Never used  ?Substance Use Topics  ? Alcohol use: Never  ? Drug use: Never  ? ? ? Colonoscopy: ? PAP: ? Bone density: ? Lipid panel: ? ?Allergies  ?  Allergen Reactions  ? Cephalexin Other (See Comments)  ?  Possible headache   ? ? ?Current Outpatient Medications  ?Medication Sig Dispense Refill  ? ADVAIR DISKUS 250-50 MCG/DOSE AEPB Inhale 1 puff into the lungs every 12 (twelve) hours.    ? alendronate (FOSAMAX) 70 MG tablet TAKE 1 TABLET EVERY 7 DAYS WITH A  FULL GLASS OF WATER ON AN EMPTY STOMACH DO NOT LIE DOWN FOR AT LEAST 30 MIN 12 tablet 3  ? Biotin 5 MG CAPS Take 5 mg by mouth daily.    ? Cholecalciferol 25 MCG (1000 UT) tablet Take 1,000 Units by mouth daily.    ? Multiple Vitamins-Minerals (MULTIVITAL PLATINUM SILVER PO) Take 1 tablet by mouth daily.    ? Multiple Vitamins-Minerals (PRESERVISION AREDS) CAPS Take 1 capsule by mouth 2 (two) times daily.    ? potassium chloride (K-DUR,KLOR-CON) 10 MEQ tablet Take 10 mEq by mouth 2 (two) times daily.    ? PROAIR HFA 108 (90 Base) MCG/ACT inhaler Inhale 2 puffs into the lungs every 4 (four) hours as needed for wheezing.    ? spironolactone (ALDACTONE) 25 MG tablet Take 25 mg by mouth daily.    ? tamoxifen (NOLVADEX) 10 MG tablet TAKE ONE (1) TABLET BY MOUTH TWO TIMES PER DAY 120 tablet 3  ? mometasone (NASONEX) 50 MCG/ACT nasal spray Place into the nose.    ? trastuzumab-dkst 2 mg/kg in sodium chloride 0.9 % 250 mL Inject 2 mg/kg into the vein once. (Patient not taking: Reported on 06/10/2021)    ? ?No current facility-administered medications for this visit.  ? ? ?OBJECTIVE: ?Vitals:  ? 06/10/21 1047  ?BP: (!) 158/76  ?Pulse: 86  ?Resp: 16  ?Temp: (!) 97.4 ?F (36.3 ?C)  ?SpO2: 98%  ?   Body mass index is 21.52 kg/m?Marland Kitchen    ECOG FS:0 - Asymptomatic ? ?General: Well-developed, well-nourished, no acute distress. ?Eyes: Pink conjunctiva, anicteric sclera. ?HEENT: Normocephalic, moist mucous membranes. ?Breast: Patient request exam be deferred today. ?Lungs: No audible wheezing or coughing. ?Heart: Regular rate and rhythm. ?Abdomen: Soft, nontender, no obvious distention. ?Musculoskeletal: No edema, cyanosis, or clubbing. ?Neuro: Alert, answering all questions appropriately. Cranial nerves grossly intact. ?Skin: No rashes or petechiae noted. ?Psych: Normal affect. ? ?LAB RESULTS: ? ?Lab Results  ?Component Value Date  ? NA 139 02/06/2020  ? K 4.6 02/06/2020  ? CL 100 02/06/2020  ? CO2 31 02/06/2020  ? GLUCOSE 93 02/06/2020   ? BUN 21 02/06/2020  ? CREATININE 0.90 02/06/2020  ? CALCIUM 9.8 02/06/2020  ? PROT 6.2 (L) 02/06/2020  ? ALBUMIN 4.0 02/06/2020  ? AST 22 02/06/2020  ? ALT 22 02/06/2020  ? ALKPHOS 48 02/06/2020  ? BILITOT 0.7 02/06/2020  ? GFRNONAA >60 02/06/2020  ? GFRAA >60 11/07/2019  ? ? ?Lab Results  ?Component Value Date  ? WBC 6.9 02/06/2020  ? NEUTROABS 5.0 02/06/2020  ? HGB 13.1 02/06/2020  ? HCT 39.9 02/06/2020  ? MCV 91.5 02/06/2020  ? PLT 232 02/06/2020  ? ? ? ?STUDIES: ?No results found. ? 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 ? ?PLAN:   ? ?1. 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  Patient underwent lumpectomy on December 16, 2018.  Although guidelines recommend reexcision given her close margins, the benefit of this is unclear given patient's advanced age and she ultimately chose not to undergo resection.  She completed adjuvant XRT.  She declined adjuvant treatment with chemotherapy, but wanted to pursue single agent Orgivri which she completed on February 27, 2020.  She was initially started on letrozole, but this was transitioned to tamoxifen secondary to osteoporosis.  She will complete 5 years of treatment in January 2026.  Given her high risk disease, will consider extending treatment.  Patient's most recent mammogram on December 05, 2020 was reported as BI-RADS 2.  Repeat in October 2023.  Return to clinic in 6 months for routine evaluation.   ?2.  Osteoporosis: Repeat bone mineral density on March 25, 2021 shows that despite taking Fosamax, patient had a slightly worse T score than previous of -3.8.  1 year prior, patient's T score was -3.5.  Discontinue letrozole and return to clinic in 1 week to  initiate Prolia every 6 months.  Continue calcium and vitamin D supplementation, although unclear of patient's compliance.  Repeat bone mineral density in January 2024.   ?3.  Hypertension: Chronic and unchanged.  Continue fo

## 2021-06-10 ENCOUNTER — Inpatient Hospital Stay: Payer: PPO | Attending: Oncology | Admitting: Oncology

## 2021-06-10 VITALS — BP 158/76 | HR 86 | Temp 97.4°F | Resp 16 | Ht 64.0 in | Wt 125.4 lb

## 2021-06-10 DIAGNOSIS — I10 Essential (primary) hypertension: Secondary | ICD-10-CM | POA: Insufficient documentation

## 2021-06-10 DIAGNOSIS — Z170 Estrogen receptor positive status [ER+]: Secondary | ICD-10-CM | POA: Diagnosis not present

## 2021-06-10 DIAGNOSIS — M810 Age-related osteoporosis without current pathological fracture: Secondary | ICD-10-CM | POA: Diagnosis not present

## 2021-06-10 DIAGNOSIS — Z923 Personal history of irradiation: Secondary | ICD-10-CM | POA: Insufficient documentation

## 2021-06-10 DIAGNOSIS — C50411 Malignant neoplasm of upper-outer quadrant of right female breast: Secondary | ICD-10-CM | POA: Insufficient documentation

## 2021-06-17 ENCOUNTER — Inpatient Hospital Stay: Payer: PPO

## 2021-06-17 DIAGNOSIS — C50911 Malignant neoplasm of unspecified site of right female breast: Secondary | ICD-10-CM

## 2021-06-17 DIAGNOSIS — M810 Age-related osteoporosis without current pathological fracture: Secondary | ICD-10-CM

## 2021-06-17 DIAGNOSIS — C50411 Malignant neoplasm of upper-outer quadrant of right female breast: Secondary | ICD-10-CM | POA: Diagnosis not present

## 2021-06-17 LAB — COMPREHENSIVE METABOLIC PANEL
ALT: 15 U/L (ref 0–44)
AST: 18 U/L (ref 15–41)
Albumin: 3.7 g/dL (ref 3.5–5.0)
Alkaline Phosphatase: 33 U/L — ABNORMAL LOW (ref 38–126)
Anion gap: 5 (ref 5–15)
BUN: 22 mg/dL (ref 8–23)
CO2: 30 mmol/L (ref 22–32)
Calcium: 9 mg/dL (ref 8.9–10.3)
Chloride: 103 mmol/L (ref 98–111)
Creatinine, Ser: 0.84 mg/dL (ref 0.44–1.00)
GFR, Estimated: >60 mL/min (ref >60)
Glucose, Bld: 96 mg/dL (ref 70–99)
Potassium: 3.9 mmol/L (ref 3.5–5.1)
Sodium: 138 mmol/L (ref 135–145)
Total Bilirubin: 0.7 mg/dL (ref 0.3–1.2)
Total Protein: 6.2 g/dL — ABNORMAL LOW (ref 6.5–8.1)

## 2021-06-17 LAB — CBC WITH DIFFERENTIAL/PLATELET
Abs Immature Granulocytes: 0.04 10*3/uL (ref 0.00–0.07)
Basophils Absolute: 0.1 10*3/uL (ref 0.0–0.1)
Basophils Relative: 1 %
Eosinophils Absolute: 0.2 10*3/uL (ref 0.0–0.5)
Eosinophils Relative: 3 %
HCT: 42 % (ref 36.0–46.0)
Hemoglobin: 13.5 g/dL (ref 12.0–15.0)
Immature Granulocytes: 1 %
Lymphocytes Relative: 14 %
Lymphs Abs: 1.2 10*3/uL (ref 0.7–4.0)
MCH: 29.7 pg (ref 26.0–34.0)
MCHC: 32.1 g/dL (ref 30.0–36.0)
MCV: 92.3 fL (ref 80.0–100.0)
Monocytes Absolute: 0.6 10*3/uL (ref 0.1–1.0)
Monocytes Relative: 7 %
Neutro Abs: 6.4 10*3/uL (ref 1.7–7.7)
Neutrophils Relative %: 74 %
Platelets: 233 10*3/uL (ref 150–400)
RBC: 4.55 MIL/uL (ref 3.87–5.11)
RDW: 14.2 % (ref 11.5–15.5)
WBC: 8.4 10*3/uL (ref 4.0–10.5)
nRBC: 0 % (ref 0.0–0.2)

## 2021-06-17 MED ORDER — DENOSUMAB 60 MG/ML ~~LOC~~ SOSY
60.0000 mg | PREFILLED_SYRINGE | Freq: Once | SUBCUTANEOUS | Status: AC
  Administered 2021-06-17: 60 mg via SUBCUTANEOUS
  Filled 2021-06-17: qty 1

## 2021-07-10 DIAGNOSIS — H6123 Impacted cerumen, bilateral: Secondary | ICD-10-CM | POA: Diagnosis not present

## 2021-07-10 DIAGNOSIS — H903 Sensorineural hearing loss, bilateral: Secondary | ICD-10-CM | POA: Diagnosis not present

## 2021-07-10 DIAGNOSIS — R42 Dizziness and giddiness: Secondary | ICD-10-CM | POA: Diagnosis not present

## 2021-08-19 DIAGNOSIS — H401130 Primary open-angle glaucoma, bilateral, stage unspecified: Secondary | ICD-10-CM | POA: Diagnosis not present

## 2021-08-25 ENCOUNTER — Ambulatory Visit
Admission: RE | Admit: 2021-08-25 | Discharge: 2021-08-25 | Disposition: A | Discharge status: Home / Self Care | Payer: PPO | Source: Ambulatory Visit | Attending: Radiation Oncology | Admitting: Radiation Oncology

## 2021-08-25 ENCOUNTER — Encounter: Payer: Self-pay | Admitting: Radiation Oncology

## 2021-08-25 VITALS — BP 159/89 | HR 77 | Temp 97.6°F | Resp 16 | Ht 64.0 in | Wt 129.4 lb

## 2021-08-25 DIAGNOSIS — Z853 Personal history of malignant neoplasm of breast: Secondary | ICD-10-CM | POA: Diagnosis not present

## 2021-08-25 DIAGNOSIS — Z08 Encounter for follow-up examination after completed treatment for malignant neoplasm: Secondary | ICD-10-CM | POA: Diagnosis not present

## 2021-08-25 DIAGNOSIS — Z170 Estrogen receptor positive status [ER+]: Secondary | ICD-10-CM

## 2021-08-25 DIAGNOSIS — C50411 Malignant neoplasm of upper-outer quadrant of right female breast: Secondary | ICD-10-CM

## 2021-08-25 NOTE — Progress Notes (Signed)
Radiation Oncology Follow up Note  Name: Hayley Nolan   Date:   08/25/2021 MRN:  [id number] DOB: December 11, 1927    This 88 y.o. female presents to the clinic today for 2-1/2-year follow-up status post whole breast radiation to her right breast for stage Ia ER/PR positive invasive mammary carcinoma.  REFERRING PROVIDER: Lauro Regulus, MD  HPI: Patient is a 88 year old female now out over 2-1/2 years having completed whole breast radiation to her right breast for stage Ia ER positive invasive mammary carcinoma seen today in routine follow-up she is doing well.  Specifically denies breast tenderness cough or bone pain..  Patient had mammograms back in October which I have reviewed were BI-RADS 2 benign.  She is currently on tamoxifen tolerating that well without side effect.  COMPLICATIONS OF TREATMENT: none  FOLLOW UP COMPLIANCE: keeps appointments   PHYSICAL EXAM:  BP (!) 159/89 (BP Location: Right Arm, Patient Position: Sitting, Cuff Size: Normal)   Pulse 77   Temp 97.6 F (36.4 C) (Tympanic)   Resp 16   Ht 5\' 4"  (1.626 m) Comment: stated wt  Wt 129 lb 6.4 oz (58.7 kg)   BMI 22.21 kg/m  Patient has a reconstructed left breast.  No dominant masses noted in either breast.  No axillary or supraclavicular adenopathy is identified.  Well-developed well-nourished patient in NAD. HEENT reveals PERLA, EOMI, discs not visualized.  Oral cavity is clear. No oral mucosal lesions are identified. Neck is clear without evidence of cervical or supraclavicular adenopathy. Lungs are clear to A&P. Cardiac examination is essentially unremarkable with regular rate and rhythm without murmur rub or thrill. Abdomen is benign with no organomegaly or masses noted. Motor sensory and DTR levels are equal and symmetric in the upper and lower extremities. Cranial nerves II through XII are grossly intact. Proprioception is intact. No peripheral adenopathy or edema is identified. No motor or sensory levels are  noted. Crude visual fields are within normal range.  RADIOLOGY RESULTS: Mammograms reviewed compatible with above-stated findings.  PLAN: Present time patient is doing well now out close to 3 years with no evidence of disease after whole breast radiation to her right breast.  I Ernie Hew turn follow-up care over to medical oncology and her PMD.  I be happy to reevaluate the patient anytime should that be indicated.  Patient and family know to call with any concerns.  She continues on tamoxifen without side effect.  I would like to take this opportunity to thank you for allowing me to participate in the care of your patient.Carmina Miller, MD

## 2021-08-28 DIAGNOSIS — I129 Hypertensive chronic kidney disease with stage 1 through stage 4 chronic kidney disease, or unspecified chronic kidney disease: Secondary | ICD-10-CM | POA: Diagnosis not present

## 2021-08-28 DIAGNOSIS — N1831 Chronic kidney disease, stage 3a: Secondary | ICD-10-CM | POA: Diagnosis not present

## 2021-08-28 DIAGNOSIS — E7800 Pure hypercholesterolemia, unspecified: Secondary | ICD-10-CM | POA: Diagnosis not present

## 2021-08-29 ENCOUNTER — Other Ambulatory Visit: Payer: Self-pay | Admitting: Oncology

## 2021-09-04 DIAGNOSIS — E7800 Pure hypercholesterolemia, unspecified: Secondary | ICD-10-CM | POA: Diagnosis not present

## 2021-09-04 DIAGNOSIS — I129 Hypertensive chronic kidney disease with stage 1 through stage 4 chronic kidney disease, or unspecified chronic kidney disease: Secondary | ICD-10-CM | POA: Diagnosis not present

## 2021-09-04 DIAGNOSIS — J4521 Mild intermittent asthma with (acute) exacerbation: Secondary | ICD-10-CM | POA: Diagnosis not present

## 2021-09-04 DIAGNOSIS — E876 Hypokalemia: Secondary | ICD-10-CM | POA: Diagnosis not present

## 2021-09-04 DIAGNOSIS — N1831 Chronic kidney disease, stage 3a: Secondary | ICD-10-CM | POA: Diagnosis not present

## 2021-09-10 IMAGING — MG MM PLC BREAST LOC DEV 1ST LESION INC*R*
5 series · 6 of 9 positions shown · non-contrast
Comparison: Previous exams.

CLINICAL DATA: Patient presents prior to same-day surgery for
bracket localization of calcifications in the right breast.

EXAM:
NEEDLE LOCALIZATION OF THE RIGHT BREAST WITH MAMMO GUIDANCE x 2

[R CC (1 of 3)]
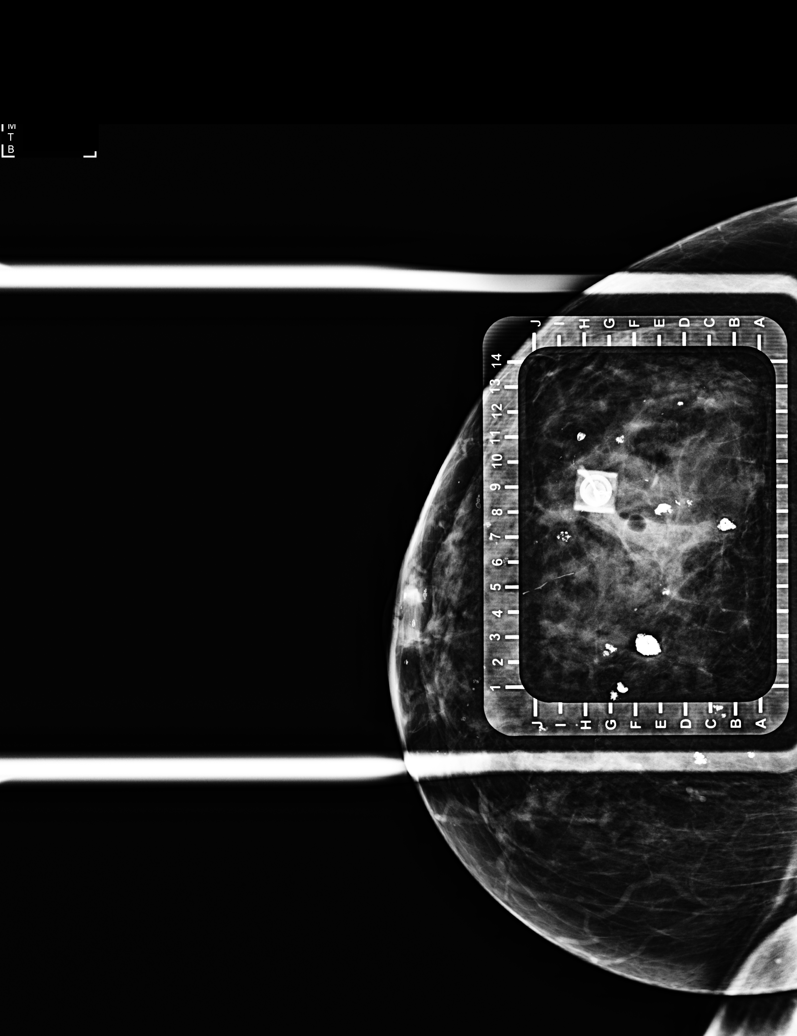

[R CC (2 of 3)]
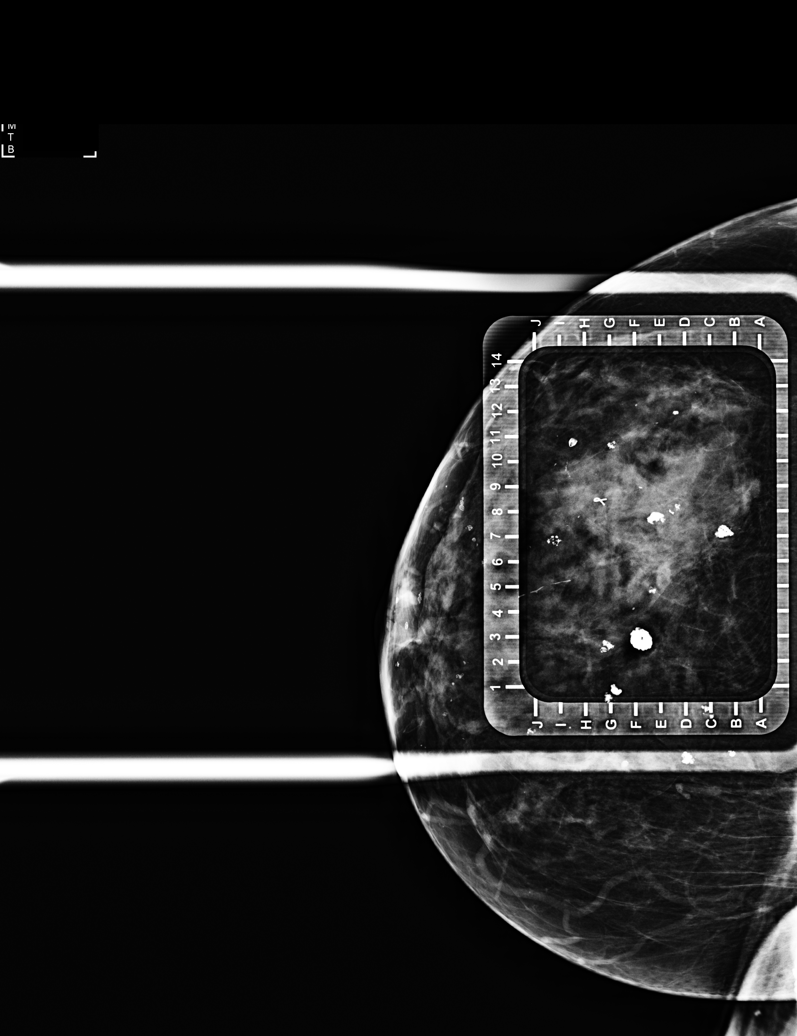

[R CC (3 of 3)]
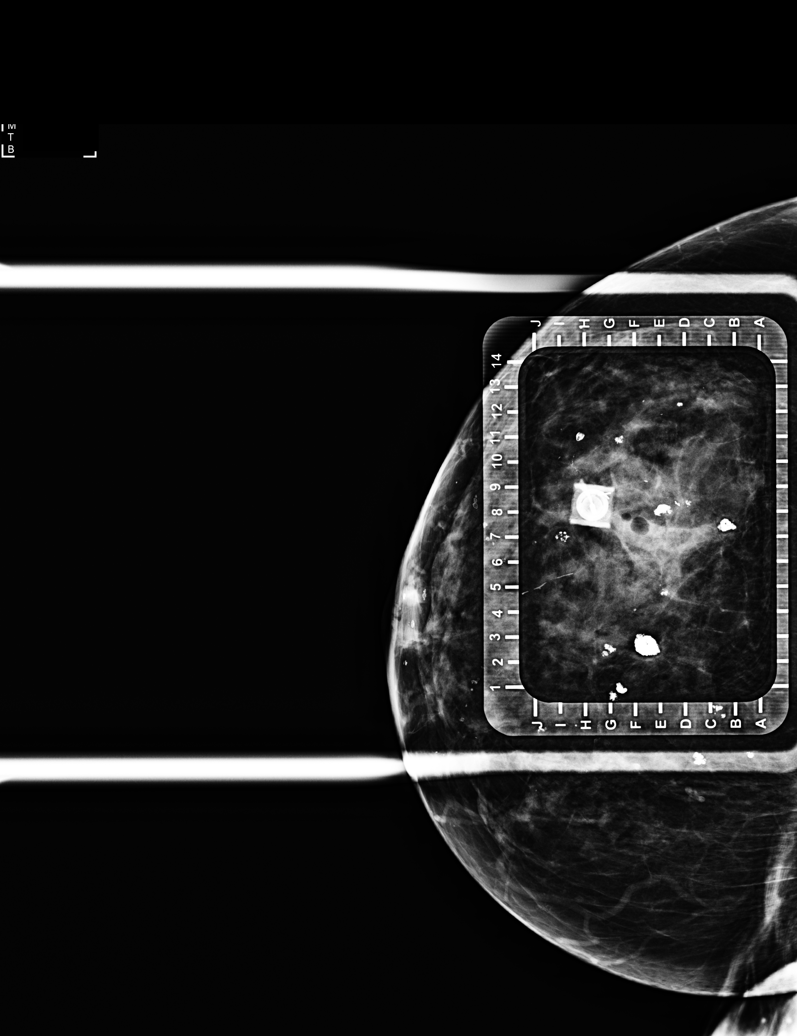

[R CC synth-2D]
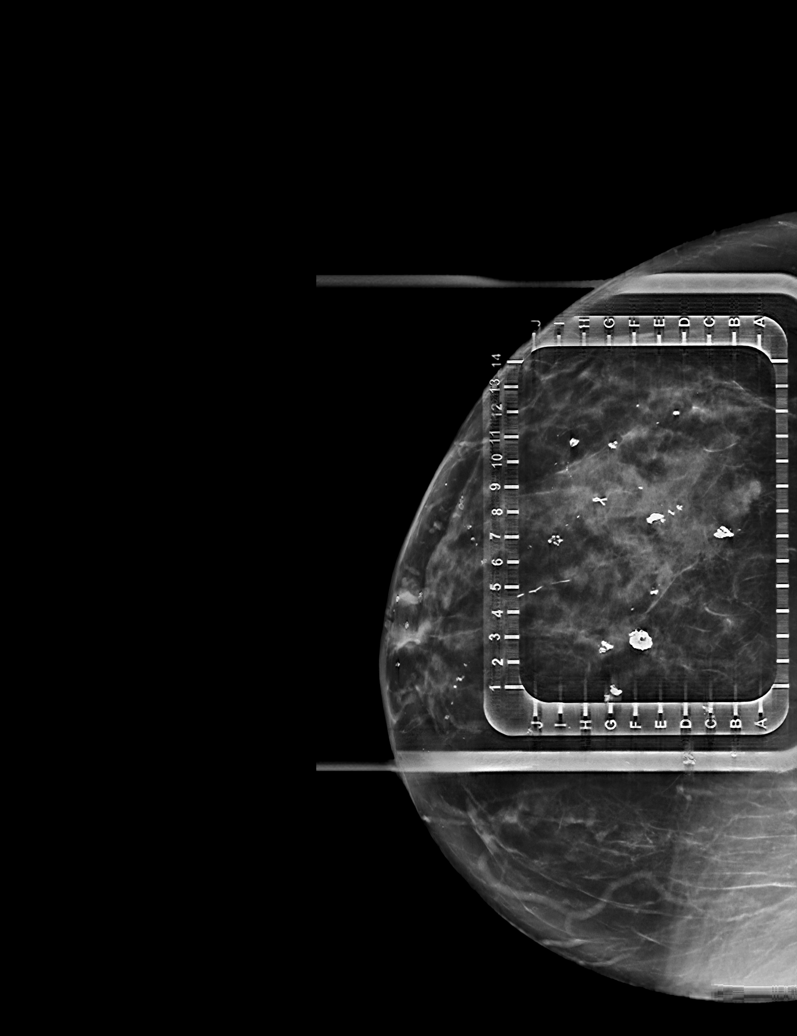

[R CC tomo · 2 of 55 frames shown]
[frame 18/55]
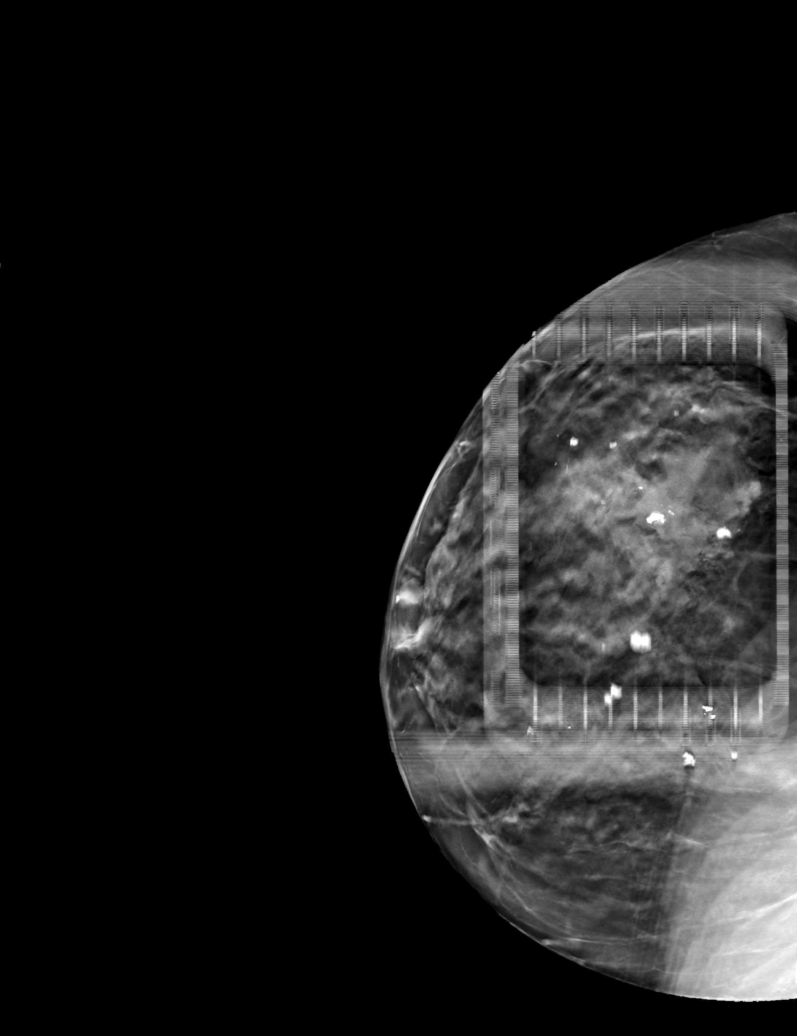
[frame 28/55]
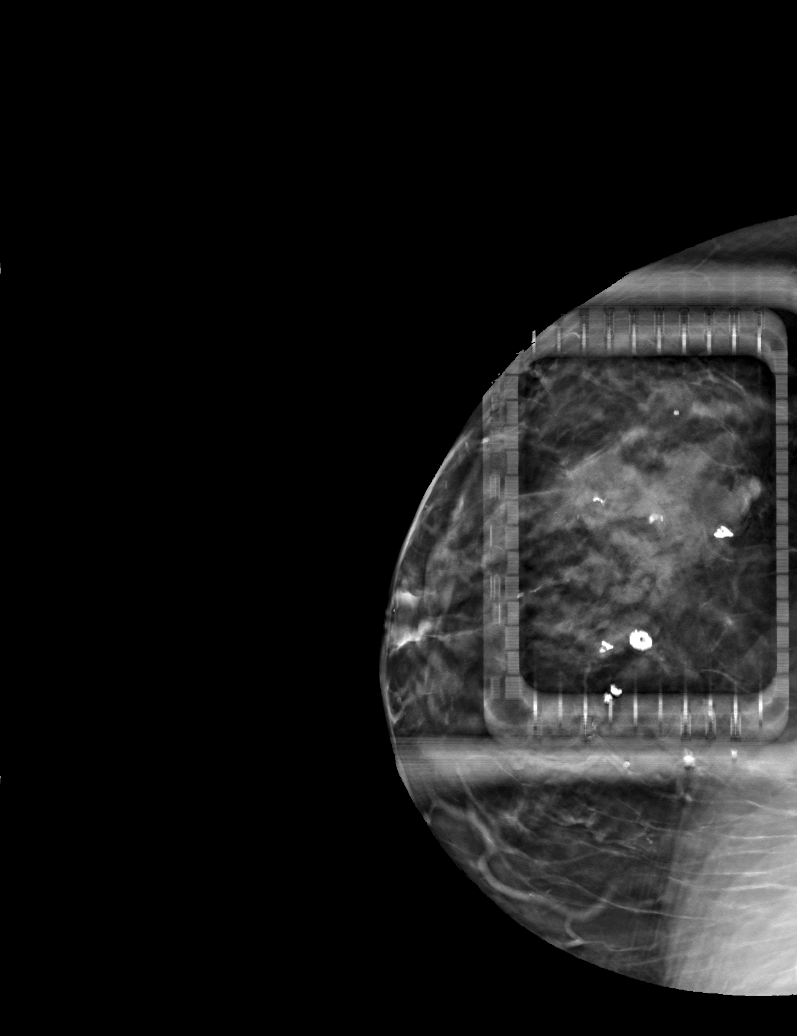

[6 of 9 positions shown; findings below may reference images not displayed]

FINDINGS: Patient presents for needle localization prior to lumpectomy for
right breast calcifications. I met with the patient and we discussed
the procedure of needle localization including benefits and
alternatives. We discussed the high likelihood of a successful
procedure. We discussed the risks of the procedure, including
infection, bleeding, tissue injury, and further surgery. Informed,
written consent was given. The usual time-out protocol was performed
immediately prior to the procedure.

Using mammographic guidance, sterile technique, 1% lidocaine and a 9
cm modified Kopans needle, the anterior ribbon clip was localized
using superior approach.

Using mammographic guidance, sterile technique, 1% lidocaine and a 9
cm modified Kopans needle, the posterior X shaped clip localized
using superior approach.

The images were marked for Dr. Stowe.
IMPRESSION: Needle bracket localization of the right breast. No apparent
complications.

## 2021-09-10 IMAGING — MG MM PLC BREAST LOC DEV EA ADD LEASION INC MAMMO GUIDE*R*
7 series · 7 of 7 positions shown · non-contrast
Comparison: Previous exams.

CLINICAL DATA: Patient presents prior to same-day surgery for
bracket localization of calcifications in the right breast.

EXAM:
NEEDLE LOCALIZATION OF THE RIGHT BREAST WITH MAMMO GUIDANCE x 2

[R LM (1 of 6)]
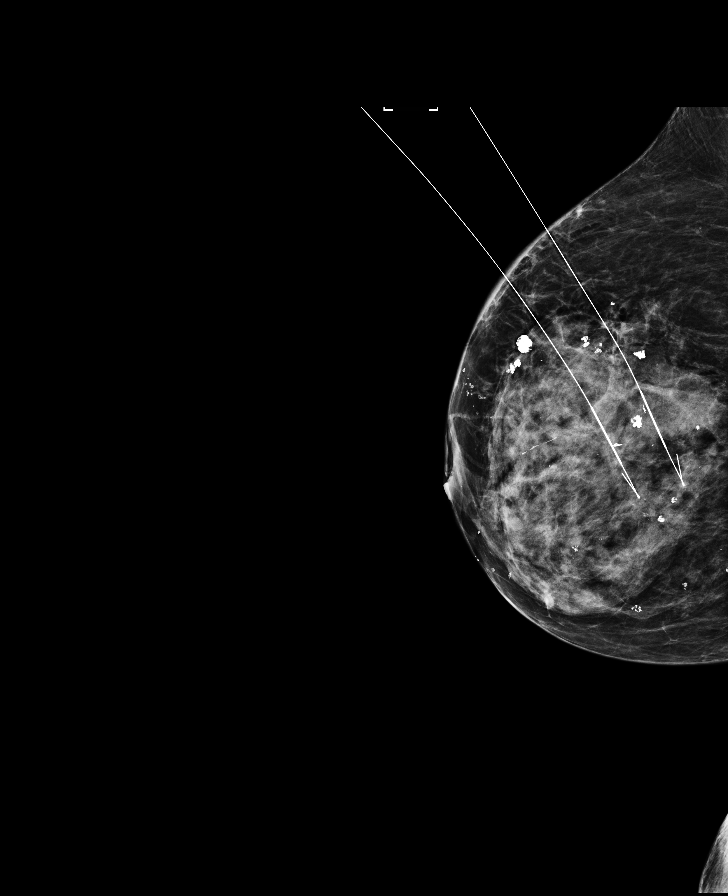

[R CC]
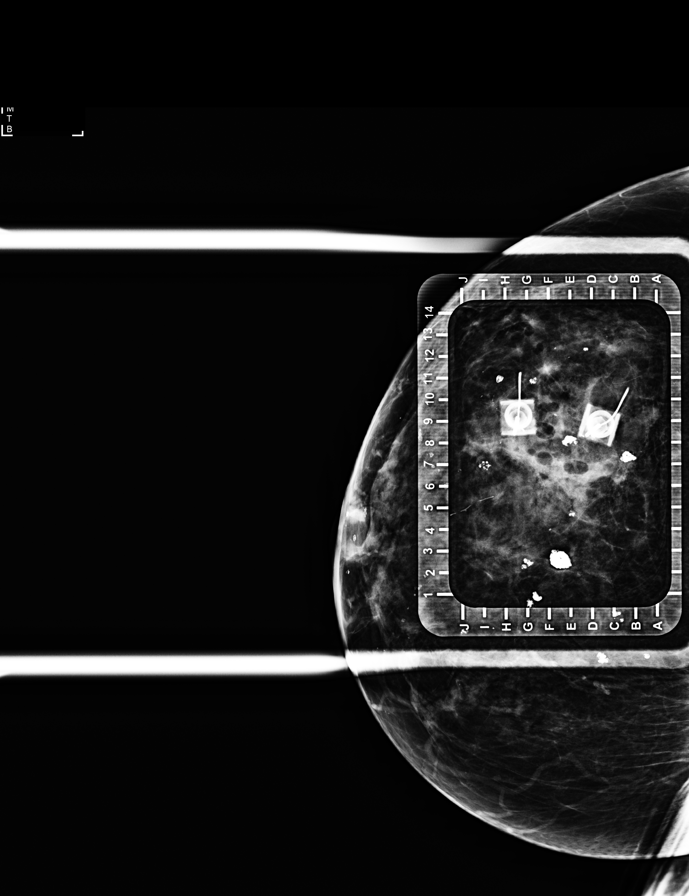

[R LM (2 of 6)]
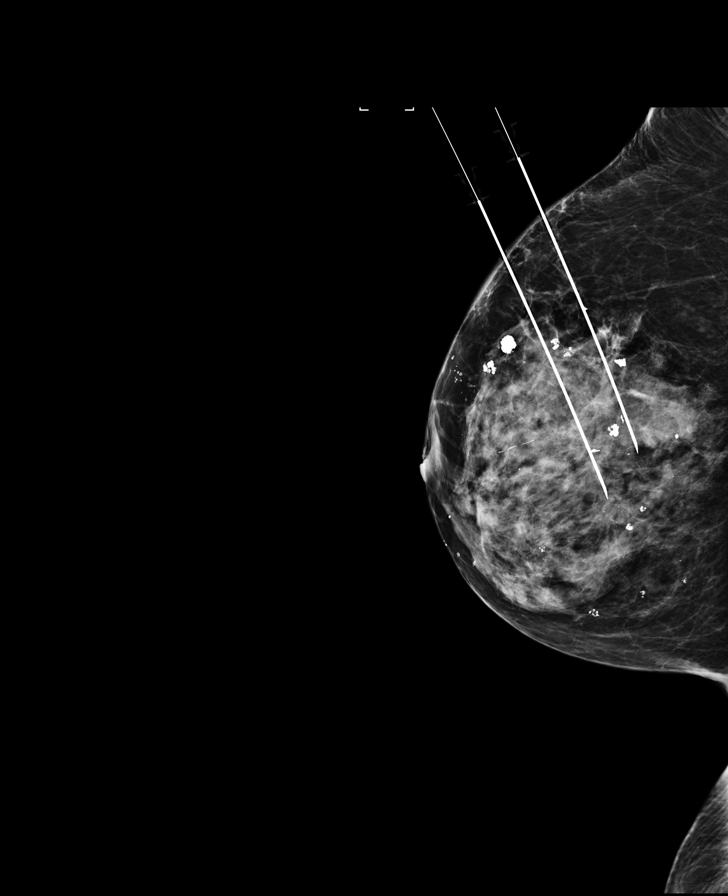

[R LM (3 of 6)]
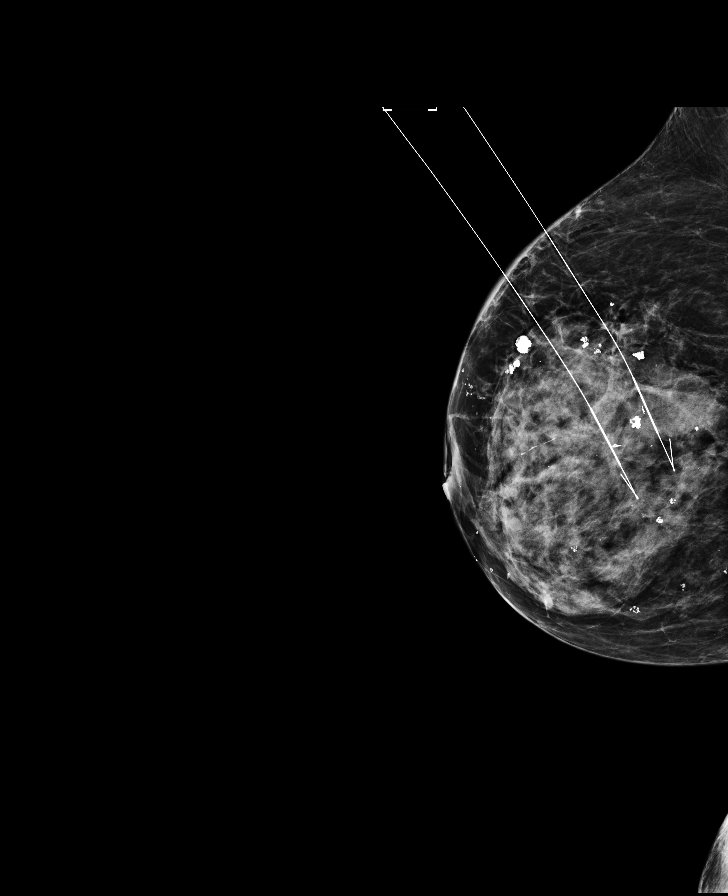

[R LM (4 of 6)]
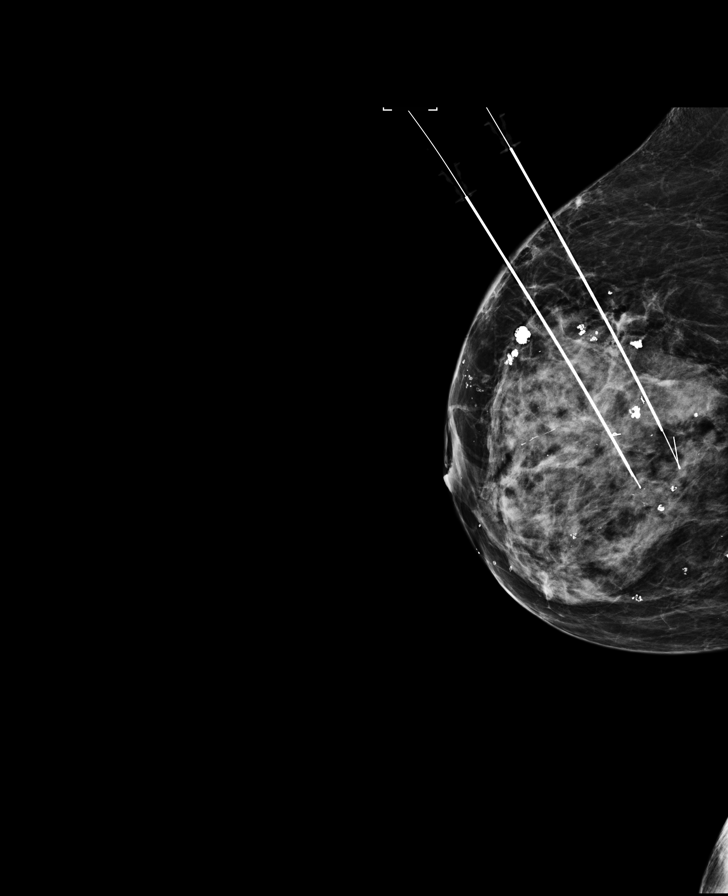

[R LM (5 of 6)]
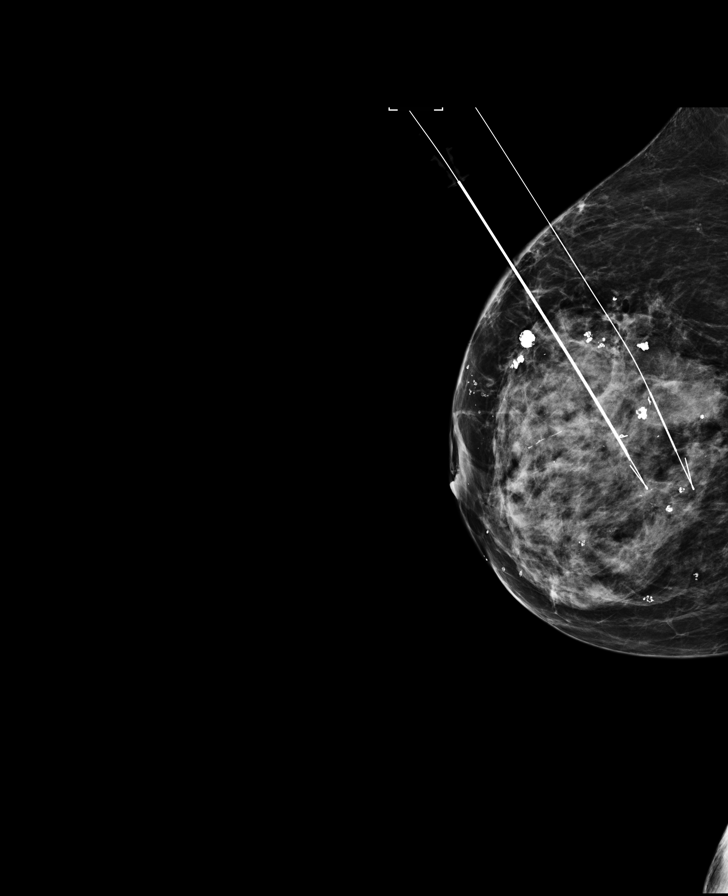

[R LM (6 of 6)]
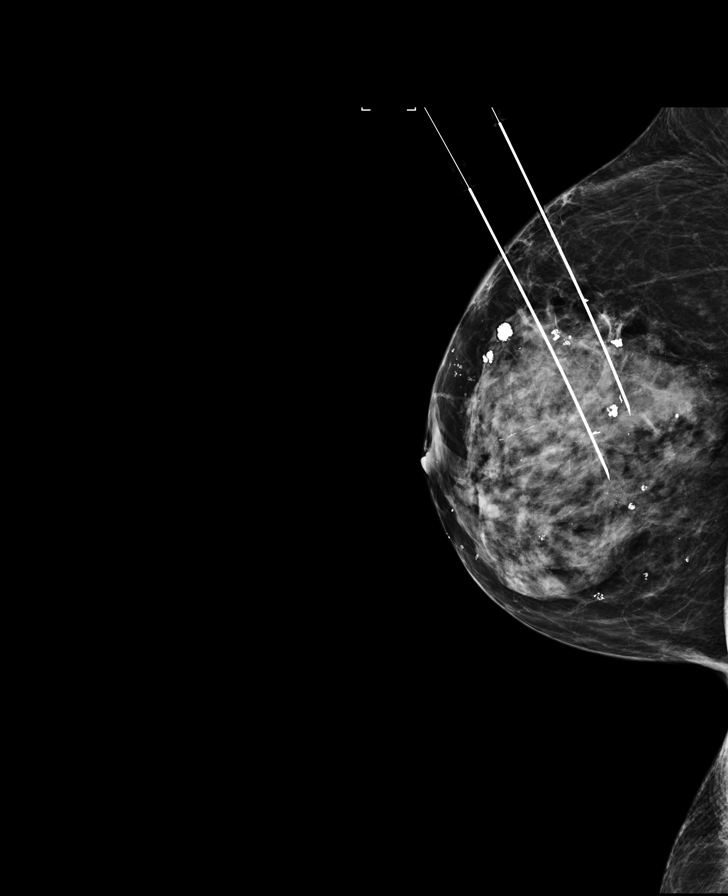

[7 of 7 positions shown; findings below may reference images not displayed]

FINDINGS: Patient presents for needle localization prior to lumpectomy for
right breast calcifications. I met with the patient and we discussed
the procedure of needle localization including benefits and
alternatives. We discussed the high likelihood of a successful
procedure. We discussed the risks of the procedure, including
infection, bleeding, tissue injury, and further surgery. Informed,
written consent was given. The usual time-out protocol was performed
immediately prior to the procedure.

Using mammographic guidance, sterile technique, 1% lidocaine and a 9
cm modified Kopans needle, the anterior ribbon clip was localized
using superior approach.

Using mammographic guidance, sterile technique, 1% lidocaine and a 9
cm modified Kopans needle, the posterior X shaped clip localized
using superior approach.

The images were marked for Dr. Stowe.
IMPRESSION: Needle bracket localization of the right breast. No apparent
complications.

## 2021-10-31 DIAGNOSIS — Z111 Encounter for screening for respiratory tuberculosis: Secondary | ICD-10-CM | POA: Diagnosis not present

## 2021-11-13 DIAGNOSIS — R300 Dysuria: Secondary | ICD-10-CM | POA: Diagnosis not present

## 2021-11-13 DIAGNOSIS — R350 Frequency of micturition: Secondary | ICD-10-CM | POA: Diagnosis not present

## 2021-11-20 ENCOUNTER — Encounter: Payer: Self-pay | Admitting: Oncology

## 2021-11-27 ENCOUNTER — Ambulatory Visit (INDEPENDENT_AMBULATORY_CARE_PROVIDER_SITE_OTHER): Payer: PPO | Admitting: Dermatology

## 2021-11-27 ENCOUNTER — Encounter: Payer: Self-pay | Admitting: Dermatology

## 2021-11-27 DIAGNOSIS — D171 Benign lipomatous neoplasm of skin and subcutaneous tissue of trunk: Secondary | ICD-10-CM

## 2021-11-27 DIAGNOSIS — Z85828 Personal history of other malignant neoplasm of skin: Secondary | ICD-10-CM

## 2021-11-27 DIAGNOSIS — Z1283 Encounter for screening for malignant neoplasm of skin: Secondary | ICD-10-CM

## 2021-11-27 DIAGNOSIS — L820 Inflamed seborrheic keratosis: Secondary | ICD-10-CM | POA: Diagnosis not present

## 2021-11-27 DIAGNOSIS — D692 Other nonthrombocytopenic purpura: Secondary | ICD-10-CM

## 2021-11-27 DIAGNOSIS — D229 Melanocytic nevi, unspecified: Secondary | ICD-10-CM | POA: Diagnosis not present

## 2021-11-27 DIAGNOSIS — L821 Other seborrheic keratosis: Secondary | ICD-10-CM | POA: Diagnosis not present

## 2021-11-27 DIAGNOSIS — L814 Other melanin hyperpigmentation: Secondary | ICD-10-CM | POA: Diagnosis not present

## 2021-11-27 DIAGNOSIS — D1801 Hemangioma of skin and subcutaneous tissue: Secondary | ICD-10-CM

## 2021-11-27 DIAGNOSIS — L578 Other skin changes due to chronic exposure to nonionizing radiation: Secondary | ICD-10-CM

## 2021-11-27 NOTE — Progress Notes (Signed)
Follow-Up Visit   Subjective  Hayley Nolan is a 88 y.o. female who presents for the following: Annual Exam (History of [place] - The patient presents for Total-Body Skin Exam (TBSE) for skin cancer screening and mole check.  The patient has spots, moles and lesions to be evaluated, some may be new or changing and the patient has concerns that these could be cancer./) and Other (Spots of left mandible and right nose that are growing).  Accompanied by daughter  The following portions of the chart were reviewed this encounter and updated as appropriate:   Tobacco  Allergies  Meds  Problems  Med Hx  Surg Hx  Fam Hx     Review of Systems:  No other skin or systemic complaints except as noted in HPI or Assessment and Plan.  Objective  Well appearing patient in no apparent distress; mood and affect are within normal limits.  A full examination was performed including scalp, head, eyes, ears, nose, lips, neck, chest, axillae, abdomen, back, buttocks, bilateral upper extremities, bilateral lower extremities, hands, feet, fingers, toes, fingernails, and toenails. All findings within normal limits unless otherwise noted below.  Left mandible x 1, right medial cheek x 1, chest x 1, legs x 3 (6) Erythematous stuck-on, waxy papule or plaque  Left Lower Back 8.0 cm rubbery nodule   Assessment & Plan   History of Basal Cell Carcinoma of the Skin - No evidence of recurrence today - Recommend regular full body skin exams - Recommend daily broad spectrum sunscreen SPF 30+ to sun-exposed areas, reapply every 2 hours as needed.  - Call if any new or changing lesions are noted between office visits  Purpura - Chronic; persistent and recurrent.  Treatable, but not curable. - Violaceous macules and patches - Benign - Related to trauma, age, sun damage and/or use of blood thinners, chronic use of topical and/or oral steroids - Observe - Can use OTC arnica containing moisturizer such as  Dermend Bruise Formula if desired - Call for worsening or other concerns  Lentigines - Scattered tan macules - Due to sun exposure - Benign-appearing, observe - Recommend daily broad spectrum sunscreen SPF 30+ to sun-exposed areas, reapply every 2 hours as needed. - Call for any changes  Seborrheic Keratoses - Stuck-on, waxy, tan-brown papules and/or plaques  - Benign-appearing - Discussed benign etiology and prognosis. - Observe - Call for any changes  Melanocytic Nevi - Tan-brown and/or pink-flesh-colored symmetric macules and papules - Benign appearing on exam today - Observation - Call clinic for new or changing moles - Recommend daily use of broad spectrum spf 30+ sunscreen to sun-exposed areas.   Hemangiomas - Red papules - Discussed benign nature - Observe - Call for any changes  Actinic Damage - Chronic condition, secondary to cumulative UV/sun exposure - diffuse scaly erythematous macules with underlying dyspigmentation - Recommend daily broad spectrum sunscreen SPF 30+ to sun-exposed areas, reapply every 2 hours as needed.  - Staying in the shade or wearing long sleeves, sun glasses (UVA+UVB protection) and wide brim hats (4-inch brim around the entire circumference of the hat) are also recommended for sun protection.  - Call for new or changing lesions.  Skin cancer screening performed today.  Inflamed seborrheic keratosis (6) Left mandible x 1, right medial cheek x 1, chest x 1, legs x 3  Destruction of lesion - Left mandible x 1, right medial cheek x 1, chest x 1, legs x 3 Complexity: simple   Destruction method: cryotherapy  Informed consent: discussed and consent obtained   Timeout:  patient name, date of birth, surgical site, and procedure verified Lesion destroyed using liquid nitrogen: Yes   Region frozen until ice ball extended beyond lesion: Yes   Outcome: patient tolerated procedure well with no complications   Post-procedure details: wound care  instructions given    Lipoma of torso Left Lower Back  Benign-appearing.  Observation.  Call clinic for new or changing lesions.  Recommend daily use of broad spectrum spf 30+ sunscreen to sun-exposed areas.     Return in about 1 year (around 11/28/2022) for TBSE.  I, Ashok Cordia, CMA, am acting as scribe for Sarina Ser, MD . Documentation: I have reviewed the above documentation for accuracy and completeness, and I agree with the above.  Sarina Ser, MD

## 2021-11-27 NOTE — Patient Instructions (Signed)
Cryotherapy Aftercare  Wash gently with soap and water everyday.   Apply Vaseline and Band-Aid daily until healed.     Due to recent changes in healthcare laws, you may see results of your pathology and/or laboratory studies on MyChart before the doctors have had a chance to review them. We understand that in some cases there may be results that are confusing or concerning to you. Please understand that not all results are received at the same time and often the doctors may need to interpret multiple results in order to provide you with the best plan of care or course of treatment. Therefore, we ask that you please give us 2 business days to thoroughly review all your results before contacting the office for clarification. Should we see a critical lab result, you will be contacted sooner.   If You Need Anything After Your Visit  If you have any questions or concerns for your doctor, please call our main line at [phone] and press option 4 to reach your doctor's medical assistant. If no one answers, please leave a voicemail as directed and we will return your call as soon as possible. Messages left after 4 pm will be answered the following business day.   You may also send us a message via MyChart. We typically respond to MyChart messages within 1-2 business days.  For prescription refills, please ask your pharmacy to contact our office. Our fax number is [phone].  If you have an urgent issue when the clinic is closed that cannot wait until the next business day, you can page your doctor at the number below.    Please note that while we do our best to be available for urgent issues outside of office hours, we are not available 24/7.   If you have an urgent issue and are unable to reach us, you may choose to seek medical care at your doctor's office, retail clinic, urgent care center, or emergency room.  If you have a medical emergency, please immediately call 911 or go to the  emergency department.  Pager Numbers  - Dr. Kowalski: [phone]  - Dr. Moye: [phone]  - Dr. Stewart: [phone]  In the event of inclement weather, please call our main line at [phone] for an update on the status of any delays or closures.  Dermatology Medication Tips: Please keep the boxes that topical medications come in in order to help keep track of the instructions about where and how to use these. Pharmacies typically print the medication instructions only on the boxes and not directly on the medication tubes.   If your medication is too expensive, please contact our office at [phone] option 4 or send us a message through MyChart.   We are unable to tell what your co-pay for medications will be in advance as this is different depending on your insurance coverage. However, we may be able to find a substitute medication at lower cost or fill out paperwork to get insurance to cover a needed medication.   If a prior authorization is required to get your medication covered by your insurance company, please allow us 1-2 business days to complete this process.  Drug prices often vary depending on where the prescription is filled and some pharmacies may offer cheaper prices.  The website www.goodrx.com contains coupons for medications through different pharmacies. The prices here do not account for what the cost may be with help from insurance (it may be cheaper with your insurance), but the website can   give you the price if you did not use any insurance.  - You can print the associated coupon and take it with your prescription to the pharmacy.  - You may also stop by our office during regular business hours and pick up a GoodRx coupon card.  - If you need your prescription sent electronically to a different pharmacy, notify our office through  MyChart or by phone at [phone] option 4.     Si Usted Necesita Algo Despus de Su Visita  Tambin puede  enviarnos un mensaje a travs de MyChart. Por lo general respondemos a los mensajes de MyChart en el transcurso de 1 a 2 das hbiles.  Para renovar recetas, por favor pida a su farmacia que se ponga en contacto con nuestra oficina. Nuestro nmero de fax es el [phone].  Si tiene un asunto urgente cuando la clnica est cerrada y que no puede esperar hasta el siguiente da hbil, puede llamar/localizar a su doctor(a) al nmero que aparece a continuacin.   Por favor, tenga en cuenta que aunque hacemos todo lo posible para estar disponibles para asuntos urgentes fuera del horario de oficina, no estamos disponibles las 24 horas del da, los 7 das de la semana.   Si tiene un problema urgente y no puede comunicarse con nosotros, puede optar por buscar atencin mdica  en el consultorio de su doctor(a), en una clnica privada, en un centro de atencin urgente o en una sala de emergencias.  Si tiene una emergencia mdica, por favor llame inmediatamente al 911 o vaya a la sala de emergencias.  Nmeros de bper  - Dr. Kowalski: [phone]  - Dra. Moye: [phone]  - Dra. Stewart: [phone]  En caso de inclemencias del tiempo, por favor llame a nuestra lnea principal al [phone] para una actualizacin sobre el estado de cualquier retraso o cierre.  Consejos para la medicacin en dermatologa: Por favor, guarde las cajas en las que vienen los medicamentos de uso tpico para ayudarle a seguir las instrucciones sobre dnde y cmo usarlos. Las farmacias generalmente imprimen las instrucciones del medicamento slo en las cajas y no directamente en los tubos del medicamento.   Si su medicamento es muy caro, por favor, pngase en contacto con nuestra oficina llamando al [phone] y presione la opcin 4 o envenos un mensaje a travs de MyChart.   No podemos decirle cul ser su copago por los medicamentos por adelantado ya que esto es diferente dependiendo de la cobertura de su seguro.  Sin embargo, es posible que podamos encontrar un medicamento sustituto a menor costo o llenar un formulario para que el seguro cubra el medicamento que se considera necesario.   Si se requiere una autorizacin previa para que su compaa de seguros cubra su medicamento, por favor permtanos de 1 a 2 das hbiles para completar este proceso.  Los precios de los medicamentos varan con frecuencia dependiendo del lugar de dnde se surte la receta y alguna farmacias pueden ofrecer precios ms baratos.  El sitio web www.goodrx.com tiene cupones para medicamentos de diferentes farmacias. Los precios aqu no tienen en cuenta lo que podra costar con la ayuda del seguro (puede ser ms barato con su seguro), pero el sitio web puede darle el precio si no utiliz ningn seguro.  - Puede imprimir el cupn correspondiente y llevarlo con su receta a la farmacia.  - Tambin puede pasar por nuestra oficina durante el horario de atencin regular y recoger una tarjeta de cupones de GoodRx.  -   Si necesita que su receta se enve electrnicamente a una farmacia diferente, informe a nuestra oficina a travs de MyChart de  o por telfono llamando al [phone] y presione la opcin 4.  

## 2021-12-04 DIAGNOSIS — F0390 Unspecified dementia without behavioral disturbance: Secondary | ICD-10-CM | POA: Diagnosis not present

## 2021-12-04 DIAGNOSIS — N1831 Chronic kidney disease, stage 3a: Secondary | ICD-10-CM | POA: Diagnosis not present

## 2021-12-04 DIAGNOSIS — I129 Hypertensive chronic kidney disease with stage 1 through stage 4 chronic kidney disease, or unspecified chronic kidney disease: Secondary | ICD-10-CM | POA: Diagnosis not present

## 2021-12-04 DIAGNOSIS — R270 Ataxia, unspecified: Secondary | ICD-10-CM | POA: Diagnosis not present

## 2021-12-04 DIAGNOSIS — J4521 Mild intermittent asthma with (acute) exacerbation: Secondary | ICD-10-CM | POA: Diagnosis not present

## 2021-12-11 NOTE — Progress Notes (Signed)
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  INTERVAL HISTORY: Patient returns to clinic today for routine 109-monthevaluation and continuation of Prolia.  She recently moved into assisted living.  She currently feels well and is asymptomatic.  She is tolerating tamoxifen without significant side effects.  She has no neurologic complaints.  She denies any recent fevers or illnesses.  She has a good appetite and denies weight loss.  She has no chest pain, shortness of breath, cough, or hemoptysis.  She denies any nausea, vomiting, constipation, or diarrhea.  She has no urinary complaints.  Patient offers no specific complaints today.  REVIEW OF SYSTEMS:   Review of Systems  Constitutional: Negative.  Negative for fever, malaise/fatigue and weight loss.  Respiratory: Negative.  Negative for cough, hemoptysis and shortness of breath.   Cardiovascular: Negative.  Negative for chest pain and leg swelling.  Gastrointestinal: Negative.  Negative for abdominal pain.  Genitourinary: Negative.  Negative for dysuria.  Musculoskeletal: Negative.  Negative for back pain.  Skin: Negative.  Negative for rash.  Neurological: Negative.  Negative for dizziness,  focal weakness, weakness and headaches.  Psychiatric/Behavioral: Negative.  The patient is not nervous/anxious.     As per HPI. Otherwise, a complete review of systems is negative.  PAST MEDICAL HISTORY: Past Medical History:  Diagnosis Date   Asthma    Basal cell carcinoma 06/29/2016   L lat chin nodular pattern   [place]   [place]   [place]   [place]   Left breast pre cancerous- mastectomy with reconstruction   Hypertension    Personal history of chemotherapy     PAST SURGICAL HISTORY: Past Surgical History:  Procedure Laterality Date   AUGMENTATION MAMMAPLASTY Left 1999   BREAST BIOPSY Right 11/30/2018   affirm stereo bx of calcs, x marker, path pending   BREAST BIOPSY Right 11/30/2018   affirm bx of calcs, ribbon marker,path pending   BREAST LUMPECTOMY Left 12/16/2018   BREAST LUMPECTOMY WITH NEEDLE LOCALIZATION AND AXILLARY SENTINEL LYMPH NODE BX Right 12/16/2018   Procedure: BREAST LUMPECTOMY WITH NEEDLE LOCALIZATION AND AXILLARY SENTINEL LYMPH NODE BX;  Surgeon: BRobert Bellow MD;  Location: [facility] OR[facility];  Service: General;  Laterality: Right;   KIDNEY CYST REMOVAL     MASTECTOMY Left 1999   Implant   REDUCTION MAMMAPLASTY Right 1997    FAMILY HISTORY: Family History  Problem Relation Age of Onset   Breast cancer Neg Hx     ADVANCED DIRECTIVES (Y/N):  N  HEALTH MAINTENANCE: Social History   Tobacco Use   Smoking status: Never   Smokeless tobacco: Never  Vaping Use   Vaping Use: Never used  Substance Use Topics   Alcohol use: Never   Drug use: Never     Colonoscopy:  PAP:  Bone density:  Lipid panel:  Allergies  Allergen Reactions   Cephalexin Other (See Comments)    Possible headache     Current Outpatient Medications  Medication Sig Dispense Refill   ADVAIR DISKUS 250-50 MCG/DOSE AEPB Inhale 1 puff into the lungs every 12 (twelve) hours.     Biotin 5 MG CAPS Take 5 mg  by mouth daily.     Cholecalciferol 25 MCG (1000 UT) tablet Take 1,000 Units by mouth daily.     doxycycline (MONODOX) 100 MG capsule Take 1 capsule (100 mg total) by mouth 2 (two) times daily for 7 days. With foot and plenty of water 14 capsule 0   Multiple Vitamins-Minerals (MULTIVITAL PLATINUM SILVER PO) Take 1 tablet by mouth daily.     Multiple Vitamins-Minerals (PRESERVISION AREDS) CAPS Take 1 capsule by mouth 2 (two) times daily.     mupirocin ointment (BACTROBAN) 2 % Apply 1 Application topically 3 (three) times daily. 22 g 0   potassium chloride (K-DUR,KLOR-CON) 10 MEQ tablet Take 10 mEq by mouth 2 (two) times daily.     PROAIR HFA 108 (90 Base) MCG/ACT inhaler Inhale 2 puffs into the lungs every 4 (four) hours as needed for wheezing.     spironolactone (ALDACTONE) 25 MG tablet Take 25 mg by mouth daily.     tamoxifen (NOLVADEX) 10 MG tablet TAKE ONE (1) TABLET BY MOUTH TWO TIMES PER DAY 120 tablet 3   trastuzumab-dkst 2 mg/kg in sodium chloride 0.9 % 250 mL Inject 2 mg/kg into the vein once.     alendronate (FOSAMAX) 70 MG tablet TAKE 1 TABLET EVERY 7 DAYS WITH A FULL GLASS OF WATER ON AN EMPTY STOMACH DO NOT LIE DOWN FOR AT LEAST 30 MIN (Patient not taking: Reported on 12/18/2021) 12 tablet 3   No current facility-administered medications for this visit.    OBJECTIVE: Vitals:   12/18/21 1137  BP: (!) 164/85  Pulse: 81  Resp: 16  Temp: 97.7 F (36.5 C)  SpO2: 94%     Body mass index is 23.05 kg/m.    ECOG FS:0 - Asymptomatic  General: Well-developed, well-nourished, no acute distress. Eyes: Pink conjunctiva, anicteric sclera. HEENT: Normocephalic, moist mucous membranes. Breast: Exam deferred today. Lungs: No audible wheezing or coughing. Heart: Regular rate and rhythm. Abdomen: Soft, nontender, no obvious distention. Musculoskeletal: No edema, cyanosis, or clubbing. Neuro: Alert, answering all questions appropriately. Cranial nerves grossly intact. Skin: No rashes or  petechiae noted. Psych: Normal affect.   LAB RESULTS:  Lab Results  Component Value Date   NA 141 12/18/2021   K 4.6 12/18/2021   CL 105 12/18/2021   CO2 28 12/18/2021   GLUCOSE 94 12/18/2021   BUN 24 (H) 12/18/2021   CREATININE 0.79 12/18/2021   CALCIUM 9.1 12/18/2021   PROT 6.0 (L) 12/18/2021   ALBUMIN 3.6 12/18/2021   AST 19 12/18/2021   ALT 14 12/18/2021   ALKPHOS 33 (L) 12/18/2021   BILITOT 0.6 12/18/2021   GFRNONAA >60 12/18/2021   GFRAA >60 11/07/2019    Lab Results  Component Value Date   WBC 8.1 12/18/2021   NEUTROABS 5.9 12/18/2021   HGB 13.0 12/18/2021   HCT 40.1 12/18/2021   MCV 93.0 12/18/2021   PLT 248 12/18/2021     STUDIES: MM DIAG BREAST TOMO UNI RIGHT  Result Date: 12/16/2021 CLINICAL DATA:  Patient presents for diagnostic right breast examination due to history of previous right malignant lumpectomy 2020. Remote malignant left mastectomy 1999. EXAM:  DIGITAL DIAGNOSTIC UNILATERAL RIGHT MAMMOGRAM WITH TOMOSYNTHESIS TECHNIQUE: Right digital diagnostic mammography and breast tomosynthesis was performed. COMPARISON:  Previous exam(s). ACR Breast Density Category c: The breast tissue is heterogeneously dense, which may obscure small masses. FINDINGS: Examination demonstrates stable post lumpectomy changes of the right breast. Remainder of the right breast is unchanged. IMPRESSION: Stable post lumpectomy changes of the right breast. RECOMMENDATION: Recommend continued annual screening mammographic follow-up of the right breast. I have discussed the findings and recommendations with the patient. If applicable, a reminder letter will be sent to the patient regarding the next appointment. BI-RADS CATEGORY  2: Benign. Electronically Signed   By: Marin Olp M.D.   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  PLAN:    1. 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  Patient underwent lumpectomy on December 16, 2018.  Although guidelines recommend reexcision given her close margins, the benefit of this is unclear given patient's advanced age and she ultimately chose not to undergo resection.  She completed adjuvant XRT.  She declined adjuvant treatment with chemotherapy, but wanted to pursue single agent Orgivri which she completed on February 27, 2020.  She was initially started on letrozole, but this was transitioned to tamoxifen secondary to osteoporosis.  She will complete 5 years of treatment in January 2026.  Given her high risk disease, will consider extending treatment for 7 to 10 years.  Patient's most recent mammogram on December 16, 2021 was reported as BI-RADS 2.  Repeat in October 2024.  Proceed with Prolia today.  Return to clinic in 6 months for routine evaluation.   2.  Osteoporosis: Repeat bone mineral density on March 25, 2021 revealed that despite taking Fosamax, patient had a slightly worse T score than previous of -3.8.  1 year prior, patient's T score was -3.5.  Transition to tamoxifen above.  Continue calcium and vitamin D supplementation.  Proceed with Prolia today.  Repeat bone mineral density in January 2024.  Return to clinic in 6 months as above for further evaluation and continuation of treatment. 3.  Hypertension: Chronic and unchanged.  Continue follow-up and treatment per primary care.   Patient expressed understanding and was in agreement with this plan. She also understands that She can call clinic at any time with any questions, concerns, or complaints.    Cancer Staging  Primary cancer of upper outer quadrant of right female breast [facility]) Staging form: Breast, AJCC 8th Edition - Clinical stage from  12/12/2018: Stage IB (cT2, cN0, cM0, G3, ER+, PR+, HER2+) - Signed by Lloyd Huger, MD on 01/11/2019 Stage prefix: Initial diagnosis Histologic grading system: 3 grade system HER2-IHC interpretation: Equivocal HER2-IHC value: Score 2+ HER2-FISH interpretation: Positive   Lloyd Huger, MD   12/19/2021 7:36 AM

## 2021-12-15 ENCOUNTER — Telehealth: Payer: Self-pay

## 2021-12-15 MED ORDER — MUPIROCIN 2 % EX OINT: 1.0000 | TOPICAL_OINTMENT | Freq: Three times a day (TID) | CUTANEOUS | 0 refills | Status: DC

## 2021-12-15 MED ORDER — DOXYCYCLINE MONOHYDRATE 100 MG PO CAPS: 100.0000 mg | ORAL_CAPSULE | Freq: Two times a day (BID) | ORAL | 0 refills | Status: AC

## 2021-12-15 NOTE — Telephone Encounter (Signed)
Patient's daughter called about places frozen at last office visit. She states some areas have healed well and some areas are very red and starting to look like they could be infected.  Would you like patient in for a OV or send something in to use?

## 2021-12-15 NOTE — Telephone Encounter (Signed)
Daughter advised of information per Dr. Nehemiah Massed and medications sent in. aw

## 2021-12-16 ENCOUNTER — Ambulatory Visit
Admission: RE | Admit: 2021-12-16 | Discharge: 2021-12-16 | Disposition: A | Discharge status: Home / Self Care | Payer: PPO | Source: Ambulatory Visit | Attending: Oncology | Admitting: Oncology

## 2021-12-16 DIAGNOSIS — R92331 Mammographic heterogeneous density, right breast: Secondary | ICD-10-CM | POA: Diagnosis not present

## 2021-12-16 DIAGNOSIS — C50411 Malignant neoplasm of upper-outer quadrant of right female breast: Secondary | ICD-10-CM | POA: Insufficient documentation

## 2021-12-18 ENCOUNTER — Inpatient Hospital Stay (HOSPITAL_BASED_OUTPATIENT_CLINIC_OR_DEPARTMENT_OTHER): Payer: PPO | Admitting: Oncology

## 2021-12-18 ENCOUNTER — Inpatient Hospital Stay: Payer: PPO | Attending: Oncology

## 2021-12-18 ENCOUNTER — Encounter: Payer: Self-pay | Admitting: Oncology

## 2021-12-18 ENCOUNTER — Inpatient Hospital Stay: Payer: PPO

## 2021-12-18 VITALS — BP 164/85 | HR 81 | Temp 97.7°F | Resp 16 | Ht 64.0 in | Wt 134.3 lb

## 2021-12-18 DIAGNOSIS — M810 Age-related osteoporosis without current pathological fracture: Secondary | ICD-10-CM | POA: Diagnosis not present

## 2021-12-18 DIAGNOSIS — C50411 Malignant neoplasm of upper-outer quadrant of right female breast: Secondary | ICD-10-CM | POA: Insufficient documentation

## 2021-12-18 DIAGNOSIS — Z170 Estrogen receptor positive status [ER+]: Secondary | ICD-10-CM | POA: Diagnosis not present

## 2021-12-18 DIAGNOSIS — C50911 Malignant neoplasm of unspecified site of right female breast: Secondary | ICD-10-CM

## 2021-12-18 DIAGNOSIS — Z79899 Other long term (current) drug therapy: Secondary | ICD-10-CM | POA: Diagnosis not present

## 2021-12-18 LAB — CBC WITH DIFFERENTIAL/PLATELET
Abs Immature Granulocytes: 0.15 10*3/uL — ABNORMAL HIGH (ref 0.00–0.07)
Basophils Absolute: 0.1 10*3/uL (ref 0.0–0.1)
Basophils Relative: 1 %
Eosinophils Absolute: 0.1 10*3/uL (ref 0.0–0.5)
Eosinophils Relative: 2 %
HCT: 40.1 % (ref 36.0–46.0)
Hemoglobin: 13 g/dL (ref 12.0–15.0)
Immature Granulocytes: 2 %
Lymphocytes Relative: 14 %
Lymphs Abs: 1.2 10*3/uL (ref 0.7–4.0)
MCH: 30.2 pg (ref 26.0–34.0)
MCHC: 32.4 g/dL (ref 30.0–36.0)
MCV: 93 fL (ref 80.0–100.0)
Monocytes Absolute: 0.7 10*3/uL (ref 0.1–1.0)
Monocytes Relative: 9 %
Neutro Abs: 5.9 10*3/uL (ref 1.7–7.7)
Neutrophils Relative %: 72 %
Platelets: 248 10*3/uL (ref 150–400)
RBC: 4.31 MIL/uL (ref 3.87–5.11)
RDW: 13.8 % (ref 11.5–15.5)
WBC: 8.1 10*3/uL (ref 4.0–10.5)
nRBC: 0 % (ref 0.0–0.2)

## 2021-12-18 LAB — COMPREHENSIVE METABOLIC PANEL
ALT: 14 U/L (ref 0–44)
AST: 19 U/L (ref 15–41)
Albumin: 3.6 g/dL (ref 3.5–5.0)
Alkaline Phosphatase: 33 U/L — ABNORMAL LOW (ref 38–126)
Anion gap: 8 (ref 5–15)
BUN: 24 mg/dL — ABNORMAL HIGH (ref 8–23)
CO2: 28 mmol/L (ref 22–32)
Calcium: 9.1 mg/dL (ref 8.9–10.3)
Chloride: 105 mmol/L (ref 98–111)
Creatinine, Ser: 0.79 mg/dL (ref 0.44–1.00)
GFR, Estimated: >60 mL/min (ref >60)
Glucose, Bld: 94 mg/dL (ref 70–99)
Potassium: 4.6 mmol/L (ref 3.5–5.1)
Sodium: 141 mmol/L (ref 135–145)
Total Bilirubin: 0.6 mg/dL (ref 0.3–1.2)
Total Protein: 6 g/dL — ABNORMAL LOW (ref 6.5–8.1)

## 2021-12-18 MED ORDER — DENOSUMAB 60 MG/ML ~~LOC~~ SOSY
60.0000 mg | PREFILLED_SYRINGE | Freq: Once | SUBCUTANEOUS | Status: AC
  Administered 2021-12-18: 60 mg via SUBCUTANEOUS
  Filled 2021-12-18: qty 1

## 2021-12-19 ENCOUNTER — Encounter: Payer: Self-pay | Admitting: Oncology

## 2021-12-23 ENCOUNTER — Other Ambulatory Visit: Payer: Self-pay

## 2021-12-23 DIAGNOSIS — M810 Age-related osteoporosis without current pathological fracture: Secondary | ICD-10-CM

## 2021-12-23 DIAGNOSIS — Z853 Personal history of malignant neoplasm of breast: Secondary | ICD-10-CM | POA: Diagnosis not present

## 2022-01-08 DIAGNOSIS — H6123 Impacted cerumen, bilateral: Secondary | ICD-10-CM | POA: Diagnosis not present

## 2022-01-08 DIAGNOSIS — H900 Conductive hearing loss, bilateral: Secondary | ICD-10-CM | POA: Diagnosis not present

## 2022-01-13 DIAGNOSIS — M79674 Pain in right toe(s): Secondary | ICD-10-CM | POA: Diagnosis not present

## 2022-01-13 DIAGNOSIS — R269 Unspecified abnormalities of gait and mobility: Secondary | ICD-10-CM | POA: Diagnosis not present

## 2022-01-13 DIAGNOSIS — L603 Nail dystrophy: Secondary | ICD-10-CM | POA: Diagnosis not present

## 2022-01-13 DIAGNOSIS — N184 Chronic kidney disease, stage 4 (severe): Secondary | ICD-10-CM | POA: Diagnosis not present

## 2022-01-13 DIAGNOSIS — M79675 Pain in left toe(s): Secondary | ICD-10-CM | POA: Diagnosis not present

## 2022-01-13 DIAGNOSIS — B351 Tinea unguium: Secondary | ICD-10-CM | POA: Diagnosis not present

## 2022-01-13 DIAGNOSIS — L851 Acquired keratosis [keratoderma] palmaris et plantaris: Secondary | ICD-10-CM | POA: Diagnosis not present

## 2022-02-24 DIAGNOSIS — H401130 Primary open-angle glaucoma, bilateral, stage unspecified: Secondary | ICD-10-CM | POA: Diagnosis not present

## 2022-03-03 DIAGNOSIS — N1831 Chronic kidney disease, stage 3a: Secondary | ICD-10-CM | POA: Diagnosis not present

## 2022-03-03 DIAGNOSIS — I129 Hypertensive chronic kidney disease with stage 1 through stage 4 chronic kidney disease, or unspecified chronic kidney disease: Secondary | ICD-10-CM | POA: Diagnosis not present

## 2022-03-03 DIAGNOSIS — E7800 Pure hypercholesterolemia, unspecified: Secondary | ICD-10-CM | POA: Diagnosis not present

## 2022-03-10 DIAGNOSIS — N1831 Chronic kidney disease, stage 3a: Secondary | ICD-10-CM | POA: Diagnosis not present

## 2022-03-10 DIAGNOSIS — J4521 Mild intermittent asthma with (acute) exacerbation: Secondary | ICD-10-CM | POA: Diagnosis not present

## 2022-03-10 DIAGNOSIS — I129 Hypertensive chronic kidney disease with stage 1 through stage 4 chronic kidney disease, or unspecified chronic kidney disease: Secondary | ICD-10-CM | POA: Diagnosis not present

## 2022-03-10 DIAGNOSIS — E7800 Pure hypercholesterolemia, unspecified: Secondary | ICD-10-CM | POA: Diagnosis not present

## 2022-03-10 DIAGNOSIS — E890 Postprocedural hypothyroidism: Secondary | ICD-10-CM | POA: Diagnosis not present

## 2022-03-10 DIAGNOSIS — Z0000 Encounter for general adult medical examination without abnormal findings: Secondary | ICD-10-CM | POA: Diagnosis not present

## 2022-03-10 DIAGNOSIS — F0390 Unspecified dementia without behavioral disturbance: Secondary | ICD-10-CM | POA: Diagnosis not present

## 2022-03-10 DIAGNOSIS — Z03818 Encounter for observation for suspected exposure to other biological agents ruled out: Secondary | ICD-10-CM | POA: Diagnosis not present

## 2022-03-26 ENCOUNTER — Ambulatory Visit
Admission: RE | Admit: 2022-03-26 | Discharge: 2022-03-26 | Disposition: A | Discharge status: Home / Self Care | Payer: PPO | Source: Ambulatory Visit | Attending: Oncology | Admitting: Oncology

## 2022-03-26 DIAGNOSIS — M810 Age-related osteoporosis without current pathological fracture: Secondary | ICD-10-CM | POA: Diagnosis not present

## 2022-03-26 DIAGNOSIS — M85851 Other specified disorders of bone density and structure, right thigh: Secondary | ICD-10-CM | POA: Diagnosis not present

## 2022-03-31 DIAGNOSIS — H2512 Age-related nuclear cataract, left eye: Secondary | ICD-10-CM | POA: Diagnosis not present

## 2022-03-31 DIAGNOSIS — Z961 Presence of intraocular lens: Secondary | ICD-10-CM | POA: Diagnosis not present

## 2022-06-18 ENCOUNTER — Encounter: Payer: Self-pay | Admitting: Oncology

## 2022-06-18 ENCOUNTER — Inpatient Hospital Stay: Payer: PPO

## 2022-06-18 ENCOUNTER — Inpatient Hospital Stay (HOSPITAL_BASED_OUTPATIENT_CLINIC_OR_DEPARTMENT_OTHER): Payer: PPO | Admitting: Oncology

## 2022-06-18 ENCOUNTER — Inpatient Hospital Stay: Payer: PPO | Attending: Oncology

## 2022-06-18 VITALS — BP 151/79 | HR 90 | Temp 97.7°F | Resp 16 | Ht 64.0 in | Wt 137.4 lb

## 2022-06-18 DIAGNOSIS — M8580 Other specified disorders of bone density and structure, unspecified site: Secondary | ICD-10-CM | POA: Insufficient documentation

## 2022-06-18 DIAGNOSIS — C50411 Malignant neoplasm of upper-outer quadrant of right female breast: Secondary | ICD-10-CM | POA: Diagnosis not present

## 2022-06-18 DIAGNOSIS — M810 Age-related osteoporosis without current pathological fracture: Secondary | ICD-10-CM

## 2022-06-18 DIAGNOSIS — C50911 Malignant neoplasm of unspecified site of right female breast: Secondary | ICD-10-CM

## 2022-06-18 LAB — COMPREHENSIVE METABOLIC PANEL
ALT: 15 U/L (ref 0–44)
AST: 21 U/L (ref 15–41)
Albumin: 3.8 g/dL (ref 3.5–5.0)
Alkaline Phosphatase: 35 U/L — ABNORMAL LOW (ref 38–126)
Anion gap: 8 (ref 5–15)
BUN: 23 mg/dL (ref 8–23)
CO2: 26 mmol/L (ref 22–32)
Calcium: 8.8 mg/dL — ABNORMAL LOW (ref 8.9–10.3)
Chloride: 105 mmol/L (ref 98–111)
Creatinine, Ser: 0.9 mg/dL (ref 0.44–1.00)
GFR, Estimated: 60 mL/min — ABNORMAL LOW (ref >60)
Glucose, Bld: 119 mg/dL — ABNORMAL HIGH (ref 70–99)
Potassium: 3.9 mmol/L (ref 3.5–5.1)
Sodium: 139 mmol/L (ref 135–145)
Total Bilirubin: 0.4 mg/dL (ref 0.3–1.2)
Total Protein: 5.8 g/dL — ABNORMAL LOW (ref 6.5–8.1)

## 2022-06-18 LAB — CBC WITH DIFFERENTIAL/PLATELET
Abs Immature Granulocytes: 0.06 10*3/uL (ref 0.00–0.07)
Basophils Absolute: 0.1 10*3/uL (ref 0.0–0.1)
Basophils Relative: 1 %
Eosinophils Absolute: 0.4 10*3/uL (ref 0.0–0.5)
Eosinophils Relative: 5 %
HCT: 39.7 % (ref 36.0–46.0)
Hemoglobin: 13.2 g/dL (ref 12.0–15.0)
Immature Granulocytes: 1 %
Lymphocytes Relative: 19 %
Lymphs Abs: 1.6 10*3/uL (ref 0.7–4.0)
MCH: 29.7 pg (ref 26.0–34.0)
MCHC: 33.2 g/dL (ref 30.0–36.0)
MCV: 89.4 fL (ref 80.0–100.0)
Monocytes Absolute: 0.8 10*3/uL (ref 0.1–1.0)
Monocytes Relative: 9 %
Neutro Abs: 5.8 10*3/uL (ref 1.7–7.7)
Neutrophils Relative %: 65 %
Platelets: 216 10*3/uL (ref 150–400)
RBC: 4.44 MIL/uL (ref 3.87–5.11)
RDW: 14 % (ref 11.5–15.5)
WBC: 8.6 10*3/uL (ref 4.0–10.5)
nRBC: 0 % (ref 0.0–0.2)

## 2022-06-18 MED ORDER — DENOSUMAB 60 MG/ML ~~LOC~~ SOSY
60.0000 mg | PREFILLED_SYRINGE | Freq: Once | SUBCUTANEOUS | Status: AC
  Administered 2022-06-18: 60 mg via SUBCUTANEOUS
  Filled 2022-06-18: qty 1

## 2022-06-18 NOTE — Progress Notes (Signed)
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  INTERVAL HISTORY: Patient returns to clinic today for routine 73-month evaluation and continuation of Prolia.  She currently feels well and is asymptomatic.  She continues to tolerate tamoxifen without significant side effects.  She has no neurologic complaints.  She denies any recent fevers or illnesses.  She has a good appetite and denies weight loss.  She has no chest pain, shortness of breath, cough, or hemoptysis.  She denies any nausea, vomiting, constipation, or diarrhea.  She has no urinary complaints.  Patient offers no specific complaints today.  REVIEW OF SYSTEMS:   Review of Systems  Constitutional: Negative.  Negative for fever, malaise/fatigue and weight loss.  Respiratory: Negative.  Negative for cough, hemoptysis and shortness of breath.   Cardiovascular: Negative.  Negative for chest pain and leg swelling.  Gastrointestinal: Negative.  Negative for abdominal pain.  Genitourinary: Negative.  Negative for dysuria.  Musculoskeletal: Negative.  Negative for back pain.  Skin: Negative.  Negative for rash.  Neurological: Negative.  Negative for dizziness, focal weakness, weakness and headaches.  Psychiatric/Behavioral: Negative.  The patient is not nervous/anxious.     As per HPI. Otherwise, a complete review of systems is negative.  PAST MEDICAL HISTORY: Past Medical History:  Diagnosis Date   Asthma    Basal cell carcinoma 06/29/2016   L lat chin  nodular pattern   Breast cancer 1999   right breast   Breast cancer 12/2018   left breast   Cancer 1999   Left breast pre cancerous- mastectomy with reconstruction   Hypertension    Personal history of chemotherapy     PAST SURGICAL HISTORY: Past Surgical History:  Procedure Laterality Date   AUGMENTATION MAMMAPLASTY Left 1999   BREAST BIOPSY Right 11/30/2018   affirm stereo bx of calcs, x marker, path pending   BREAST BIOPSY Right 11/30/2018   affirm bx of calcs, ribbon marker,path pending   BREAST LUMPECTOMY Left 12/16/2018   BREAST LUMPECTOMY WITH NEEDLE LOCALIZATION AND AXILLARY SENTINEL LYMPH NODE BX Right 12/16/2018   Procedure: BREAST LUMPECTOMY WITH NEEDLE LOCALIZATION AND AXILLARY SENTINEL LYMPH NODE BX;  Surgeon: Earline Mayotte, MD;  Location: [facility] ORS;  Service: General;  Laterality: Right;   KIDNEY CYST REMOVAL     MASTECTOMY Left 1999   Implant   REDUCTION MAMMAPLASTY Right 1997    FAMILY HISTORY: Family History  Problem Relation Age of Onset   Breast cancer Neg Hx     ADVANCED DIRECTIVES (Y/N):  N  HEALTH MAINTENANCE: Social History   Tobacco Use   Smoking status: Never   Smokeless tobacco: Never  Vaping Use   Vaping Use: Never used  Substance Use Topics   Alcohol use: Never   Drug use: Never     Colonoscopy:  PAP:  Bone density:  Lipid panel:  Allergies  Allergen Reactions   Cephalexin Other (See Comments)    Possible headache     Current Outpatient Medications  Medication Sig Dispense Refill   ADVAIR DISKUS 250-50 MCG/DOSE AEPB Inhale 1 puff into the  lungs every 12 (twelve) hours.     Biotin 5 MG CAPS Take 5 mg by mouth daily.     Cholecalciferol 25 MCG (1000 UT) tablet Take 1,000 Units by mouth daily.     Multiple Vitamins-Minerals (MULTIVITAL PLATINUM SILVER PO) Take 1 tablet by mouth daily.     Multiple Vitamins-Minerals (PRESERVISION AREDS) CAPS Take 1 capsule by mouth 2 (two) times daily.     mupirocin ointment (BACTROBAN) 2  % Apply 1 Application topically 3 (three) times daily. 22 g 0   potassium chloride (K-DUR,KLOR-CON) 10 MEQ tablet Take 10 mEq by mouth 2 (two) times daily.     PROAIR HFA 108 (90 Base) MCG/ACT inhaler Inhale 2 puffs into the lungs every 4 (four) hours as needed for wheezing.     spironolactone (ALDACTONE) 25 MG tablet Take 25 mg by mouth daily.     tamoxifen (NOLVADEX) 10 MG tablet TAKE ONE (1) TABLET BY MOUTH TWO TIMES PER DAY 120 tablet 3   trastuzumab-dkst 2 mg/kg in sodium chloride 0.9 % 250 mL Inject 2 mg/kg into the vein once.     alendronate (FOSAMAX) 70 MG tablet TAKE 1 TABLET EVERY 7 DAYS WITH A FULL GLASS OF WATER ON AN EMPTY STOMACH DO NOT LIE DOWN FOR AT LEAST 30 MIN (Patient not taking: Reported on 12/18/2021) 12 tablet 3   No current facility-administered medications for this visit.   Facility-Administered Medications Ordered in Other Visits  Medication Dose Route Frequency Provider Last Rate Last Admin   denosumab (PROLIA) injection 60 mg  60 mg Subcutaneous Once Jeralyn Ruths, MD        OBJECTIVE: Vitals:   06/18/22 1404  BP: (!) 151/79  Pulse: 90  Resp: 16  Temp: 97.7 F (36.5 C)  SpO2: 97%     Body mass index is 23.58 kg/m.    ECOG FS:0 - Asymptomatic  General: Well-developed, well-nourished, no acute distress. Eyes: Pink conjunctiva, anicteric sclera. HEENT: Normocephalic, moist mucous membranes. Lungs: No audible wheezing or coughing. Heart: Regular rate and rhythm. Abdomen: Soft, nontender, no obvious distention. Musculoskeletal: No edema, cyanosis, or clubbing. Neuro: Alert, answering all questions appropriately. Cranial nerves grossly intact. Skin: No rashes or petechiae noted. Psych: Normal affect.  LAB RESULTS:  Lab Results  Component Value Date   NA 139 06/18/2022   K 3.9 06/18/2022   CL 105 06/18/2022   CO2 26 06/18/2022   GLUCOSE 119 (H) 06/18/2022   BUN 23 06/18/2022   CREATININE 0.90 06/18/2022   CALCIUM 8.8 (L) 06/18/2022   PROT  5.8 (L) 06/18/2022   ALBUMIN 3.8 06/18/2022   AST 21 06/18/2022   ALT 15 06/18/2022   ALKPHOS 35 (L) 06/18/2022   BILITOT 0.4 06/18/2022   GFRNONAA 60 (L) 06/18/2022   GFRAA >60 11/07/2019    Lab Results  Component Value Date   WBC 8.6 06/18/2022   NEUTROABS 5.8 06/18/2022   HGB 13.2 06/18/2022   HCT 39.7 06/18/2022   MCV 89.4 06/18/2022   PLT 216 06/18/2022     STUDIES: No results found.  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  Patient underwent lumpectomy on December 16, 2018.  Although guidelines recommend reexcision given her close margins, the benefit of this is unclear given patient's advanced age and she ultimately chose not to undergo resection.  She completed adjuvant XRT.  She declined adjuvant treatment with chemotherapy, but wanted to pursue single agent Orgivri which she completed on February 27, 2020.  She was initially started on letrozole, but this was transitioned to tamoxifen secondary to osteoporosis.  She will complete 5 years of treatment in January 2026.  Given her high risk disease, will consider extending treatment for 7 to 10 years.  Patient's most recent mammogram on December 16, 2021 was reported as BI-RADS 2.  Repeat in October 2024.  Proceed with Prolia today.  Patient will return to clinic in January 2025 with repeat mammogram and further evaluation.   Osteopenia: Bone mineral density on March 26, 2022 reported T-score of -1.5 which is significantly improved from 1 year prior where her bone density was reported -3.8.  Continue tamoxifen.  Patient will receive Prolia today, but may not need additional  treatment.  Plan to repeat bone mineral density in January 2025 along with mammogram as above and then follow-up several days later to discuss the results and whether or not additional Prolia is necessary.  Continue calcium and vitamin D supplementation.   Hypertension: Chronic and unchanged.  Continue monitoring and treatment per primary care.  Patient expressed understanding and was in agreement with this plan. She also understands that She can call clinic at any time with any questions, concerns, or complaints.    Cancer Staging  Primary cancer of upper outer quadrant of right female breast Staging form: Breast, AJCC 8th Edition - Clinical stage from 12/12/2018: Stage IB (cT2, cN0, cM0, G3, ER+, PR+, HER2+) - Signed by Jeralyn Ruths, MD on 01/11/2019 Stage prefix: Initial diagnosis Histologic grading system: 3 grade system HER2-IHC interpretation: Equivocal HER2-IHC value: Score 2+ HER2-FISH interpretation: Positive   Jeralyn Ruths, MD   06/18/2022 2:49 PM

## 2022-06-29 ENCOUNTER — Other Ambulatory Visit: Payer: Self-pay | Admitting: Oncology

## 2022-06-30 ENCOUNTER — Encounter: Payer: Self-pay | Admitting: Oncology

## 2022-07-09 DIAGNOSIS — H902 Conductive hearing loss, unspecified: Secondary | ICD-10-CM | POA: Diagnosis not present

## 2022-07-09 DIAGNOSIS — H6123 Impacted cerumen, bilateral: Secondary | ICD-10-CM | POA: Diagnosis not present

## 2022-07-17 ENCOUNTER — Other Ambulatory Visit: Payer: Self-pay

## 2022-07-17 ENCOUNTER — Emergency Department: Payer: PPO

## 2022-07-17 ENCOUNTER — Emergency Department
Admission: EM | Admit: 2022-07-17 | Discharge: 2022-07-18 | Disposition: A | Discharge status: Home / Self Care | Payer: PPO | Attending: Emergency Medicine | Admitting: Emergency Medicine

## 2022-07-17 DIAGNOSIS — N183 Chronic kidney disease, stage 3 unspecified: Secondary | ICD-10-CM | POA: Diagnosis not present

## 2022-07-17 DIAGNOSIS — J45909 Unspecified asthma, uncomplicated: Secondary | ICD-10-CM | POA: Insufficient documentation

## 2022-07-17 DIAGNOSIS — I129 Hypertensive chronic kidney disease with stage 1 through stage 4 chronic kidney disease, or unspecified chronic kidney disease: Secondary | ICD-10-CM | POA: Diagnosis not present

## 2022-07-17 DIAGNOSIS — R42 Dizziness and giddiness: Secondary | ICD-10-CM | POA: Diagnosis not present

## 2022-07-17 DIAGNOSIS — N390 Urinary tract infection, site not specified: Secondary | ICD-10-CM | POA: Diagnosis not present

## 2022-07-17 DIAGNOSIS — Z853 Personal history of malignant neoplasm of breast: Secondary | ICD-10-CM | POA: Diagnosis not present

## 2022-07-17 DIAGNOSIS — Z85828 Personal history of other malignant neoplasm of skin: Secondary | ICD-10-CM | POA: Insufficient documentation

## 2022-07-17 DIAGNOSIS — R29818 Other symptoms and signs involving the nervous system: Secondary | ICD-10-CM | POA: Diagnosis not present

## 2022-07-17 LAB — CBC
HCT: 41.5 % (ref 36.0–46.0)
Hemoglobin: 13.3 g/dL (ref 12.0–15.0)
MCH: 29.7 pg (ref 26.0–34.0)
MCHC: 32 g/dL (ref 30.0–36.0)
MCV: 92.6 fL (ref 80.0–100.0)
Platelets: 224 10*3/uL (ref 150–400)
RBC: 4.48 MIL/uL (ref 3.87–5.11)
RDW: 14.3 % (ref 11.5–15.5)
WBC: 9.7 10*3/uL (ref 4.0–10.5)
nRBC: 0 % (ref 0.0–0.2)

## 2022-07-17 LAB — URINALYSIS, ROUTINE W REFLEX MICROSCOPIC
Bilirubin Urine: NEGATIVE
Glucose, UA: NEGATIVE mg/dL
Hgb urine dipstick: NEGATIVE
Ketones, ur: NEGATIVE mg/dL
Nitrite: NEGATIVE
Protein, ur: NEGATIVE mg/dL
Specific Gravity, Urine: 1.012 (ref 1.005–1.030)
pH: 5 (ref 5.0–8.0)

## 2022-07-17 LAB — BASIC METABOLIC PANEL
Anion gap: 9 (ref 5–15)
BUN: 20 mg/dL (ref 8–23)
CO2: 28 mmol/L (ref 22–32)
Calcium: 9.1 mg/dL (ref 8.9–10.3)
Chloride: 100 mmol/L (ref 98–111)
Creatinine, Ser: 0.89 mg/dL (ref 0.44–1.00)
GFR, Estimated: >60 mL/min (ref >60)
Glucose, Bld: 90 mg/dL (ref 70–99)
Potassium: 3.8 mmol/L (ref 3.5–5.1)
Sodium: 137 mmol/L (ref 135–145)

## 2022-07-17 MED ORDER — MECLIZINE HCL 25 MG PO TABS
25.0000 mg | ORAL_TABLET | Freq: Once | ORAL | Status: AC
  Administered 2022-07-17: 25 mg via ORAL
  Filled 2022-07-17: qty 1

## 2022-07-17 MED ORDER — NITROFURANTOIN MONOHYD MACRO 100 MG PO CAPS: 100.0000 mg | ORAL_CAPSULE | Freq: Two times a day (BID) | ORAL | 0 refills | Status: AC

## 2022-07-17 MED ORDER — LACTATED RINGERS IV BOLUS
1000.0000 mL | Freq: Once | INTRAVENOUS | Status: AC
  Administered 2022-07-17: 1000 mL via INTRAVENOUS

## 2022-07-17 MED ORDER — NITROFURANTOIN MONOHYD MACRO 100 MG PO CAPS
100.0000 mg | ORAL_CAPSULE | Freq: Once | ORAL | Status: AC
  Administered 2022-07-17: 100 mg via ORAL
  Filled 2022-07-17: qty 1

## 2022-07-17 NOTE — ED Notes (Signed)
Up to b/r, steady gait with walker, family x2 remain at [facility]

## 2022-07-17 NOTE — ED Notes (Signed)
Assisted pt. Up to toilet to void urine, steady gait

## 2022-07-17 NOTE — ED Notes (Signed)
EDP at [facility] 

## 2022-07-17 NOTE — Discharge Instructions (Addendum)
Your MRI of your brain did not show any stroke.  You likely have a urinary tract infection.  Please take the antibiotics twice a day for the next 5 days.  Follow-up with your primary care doctor.  If you develop fevers confusion or any numbness or weakness facial droop or difficulty speaking please return to the emergency department.

## 2022-07-17 NOTE — ED Notes (Signed)
Pt disconnected to use the restroom. Steady gait with walker to toilet. Family at bedside with patient. Primary RN notified

## 2022-07-17 NOTE — ED Notes (Signed)
First Nurse Note: Pt to ED via [facility]. Pt LKW was last night before bed. Pt reports waking up this morning around 0700 with severe dizziness. Pt reports that dizziness is worse when moving her head around. Pt had her ears cleaned out last week. Pt BP was also elevated this morning and at [facility]. Pts BP was 178/98 at [facility]. Pt is currently in NAD with no slurred speech or facial droop noted.

## 2022-07-17 NOTE — ED Triage Notes (Signed)
Pt to ED POV from [facility] [place] for dizziness since this AM around 0700 when woke up. Pt went to sleep around 1030 pm last night with no dizziness. Pt has clear speech, no weakness, no arm drift, symmetrical smile, equal grip strength. Alert and oriented.

## 2022-07-17 NOTE — ED Provider Notes (Signed)
[facility] Provider Note    Event Date/Time   First MD Initiated Contact with Patient 07/17/22 1740     (approximate)   History   Dizziness   HPI  Hayley Nolan is a 89 y.o. female past medical history of hypertension, asthma who presents with dizziness.  This started this morning after she sat up in bed she describes feeling like things were moving around and she was quite uncomfortable.  She has not felt right since and has ongoing feeling of unsteadiness even at rest although it is significant improved when she is lying still.  It is worse with sitting up and with head movement.  Denies vision change or double vision numbness tingling weakness.  Denies headache or neck pain.  Her family is at bedside tells me she has had some history of vertigo in the past and was symptomatic when they laid her flat on the table to do a test which I am assuming was a Dix-Hallpike or Epley maneuver.  Patient denies tinnitus or hearing loss.    Past Medical History:  Diagnosis Date   Asthma    Basal cell carcinoma 06/29/2016   L lat chin nodular pattern   Breast cancer (HCC) 1999   right breast   Breast cancer (HCC) 12/2018   left breast   Cancer (HCC) 1999   Left breast pre cancerous- mastectomy with reconstruction   Hypertension    Personal history of chemotherapy     Patient Active Problem List   Diagnosis Date Noted   Osteoporosis 06/10/2021   Ataxia 03/04/2020   Breast cancer (HCC) 03/04/2020   Primary cancer of upper outer quadrant of right female breast (HCC) 12/12/2018   CAP (community acquired pneumonia) 05/10/2018   History of partial thyroidectomy 02/25/2017   Chronic hypokalemia 04/18/2015   Healthcare maintenance 11/19/2014   History of malignant neoplasm of left breast 12/07/2013   Asthma 09/30/2013   GERD (gastroesophageal reflux disease) 09/30/2013   Hypercholesterolemia 09/30/2013   Hypertensive kidney disease with CKD stage III (HCC)  09/30/2013     Physical Exam  Triage Vital Signs: ED Triage Vitals [07/17/22 1307]  Enc Vitals Group     BP (!) 153/78     Pulse Rate 77     Resp 16     Temp 98.3 F (36.8 C)     Temp Source Oral     SpO2 94 %     Weight 137 lb (62.1 kg)     Height 5\' 5"  (1.651 m)     Head Circumference      Peak Flow      Pain Score 0     Pain Loc      Pain Edu?      Excl. in GC?     Most recent vital signs: Vitals:   07/17/22 1845 07/17/22 2045  BP: (!) 151/87 (!) 166/90  Pulse: 87 87  Resp: 20   Temp:    SpO2: 95% 98%     General: Awake, no distress.  CV:  Good peripheral perfusion.  Resp:  Normal effort.  Abd:  No distention.  Neuro:             Awake, Alert, Oriented x 3  Other:  Right beating nystagmus horizontal no vertical nystagmus Aox3, nml speech  PERRL, EOMI, face symmetric, nml tongue movement  5/5 strength in the BL upper and lower extremities  Sensation grossly intact in the BL upper and lower extremities  Finger-nose-finger  intact BL Patient is able to ambulate with assistance has some apprehension but is not grossly ataxic   ED Results / Procedures / Treatments  Labs (all labs ordered are listed, but only abnormal results are displayed) Labs Reviewed  URINALYSIS, ROUTINE W REFLEX MICROSCOPIC - Abnormal; Notable for the following components:      Result Value   Color, Urine YELLOW (*)    APPearance HAZY (*)    Leukocytes,Ua TRACE (*)    Bacteria, UA RARE (*)    All other components within normal limits  URINE CULTURE  BASIC METABOLIC PANEL  CBC     EKG  EKG interpretation performed by myself: NSR, LAD, LAFB nml intervals, no acute ischemic changes, LVH    RADIOLOGY I reviewed and interpreted the CT scan of the brain which does not show any acute intracranial process    PROCEDURES:  Critical Care performed: No  Procedures  The patient is on the cardiac monitor to evaluate for evidence of arrhythmia and/or significant heart rate  changes.   MEDICATIONS ORDERED IN ED: Medications  lactated ringers bolus 1,000 mL (0 mLs Intravenous Stopped 07/17/22 2339)  meclizine (ANTIVERT) tablet 25 mg (25 mg Oral Given 07/17/22 1852)  nitrofurantoin (macrocrystal-monohydrate) (MACROBID) capsule 100 mg (100 mg Oral Given 07/17/22 2026)     IMPRESSION / MDM / ASSESSMENT AND PLAN / ED COURSE  I reviewed the triage vital signs and the nursing notes.                              Patient's presentation is most consistent with acute presentation with potential threat to life or bodily function.  Differential diagnosis includes, but is not limited to, peripheral vertigo including vestibular neuritis, BPPV, posterior stroke, mass, intracranial hemorrhage, vertebral artery dissection, orthostatic hypotension  The patient is a 89 year old female who presents with dizziness.  This started after she sat up from bed this morning and had a spinning feeling.  She has somewhat difficulty describing ongoing symptoms but says she does not feel right even when she is sitting in bed.  She denies frank vertiginous feeling but says she feels off balance.  She denies other neurologic symptoms including diplopia dysarthria difficulty speaking numbness tingling or weakness.  She does walk with a walker and has been somewhat more unsteady since but is still ambulating.  Denies chest pain or dyspnea.  Patient has had increased urination is having some burning with urination and did get up to pee multiple times in the ER.  Patient's exam is reassuring she does have horizontal right beating nystagmus but no vertical or direction changing nystagmus no ataxia and she is able to ambulate does have some apprehension and feels dizzy but is not grossly ataxic.  Labs are overall reassuring.  Urinalysis does have some pyuria and rare bacteria given she does have symptoms we will treat as a cystitis.  I did obtain a CT head given patient's age and ongoing symptoms this is  negative.  I gave patient a bolus of fluid and meclizine she continued to feel symptomatic.  Thus obtained MRI to evaluate for posterior stroke and this is negative.  This along with patient's reassuring exam makes posterior stroke less likely.  At this point think she is appropriate for discharge especially since she is able to ambulate.  Will discharge with prescription for Macrobid.  Discussed return precautions.  Recommend PCP follow-up.       FINAL CLINICAL  IMPRESSION(S) / ED DIAGNOSES   Final diagnoses:  Urinary tract infection without hematuria, site unspecified  Dizziness     Rx / DC Orders   ED Discharge Orders          Ordered    nitrofurantoin, macrocrystal-monohydrate, (MACROBID) 100 MG capsule  2 times daily        07/17/22 2320             Note:  This document was prepared using Dragon voice recognition software and may include unintentional dictation errors.   Georga Hacking, MD 07/17/22 2350

## 2022-07-17 NOTE — ED Notes (Addendum)
Seen at [facility] around 1225, and instructed to come to ED. Pt alert, NAD, calm, interactive, resps e/u, speaking in cl;ear complete sentences. C/o dizziness, "can barely keep my head up", denies nausea, or other sx. H/o vertigo, and crystals in ears. Sx constant since this am, even when lying still.

## 2022-07-17 NOTE — ED Notes (Signed)
Up to b/r, steady gait with walker, c/o continued dizziness, describes as severe, gait unremarkable with walker.

## 2022-07-23 DIAGNOSIS — R42 Dizziness and giddiness: Secondary | ICD-10-CM | POA: Diagnosis not present

## 2022-07-28 ENCOUNTER — Telehealth: Payer: Self-pay

## 2022-07-28 NOTE — Telephone Encounter (Signed)
Transition Care Management Unsuccessful Follow-up Telephone Call  Date of discharge and from where:  [place] 5/18  Attempts:  1st Attempt  Reason for unsuccessful TCM follow-up call:  Left voice message   Lenard Forth [facility] Guide, [facility] [phone] [address] [place], [place], [place] [address] Phone: [phone] Email: [email or web address]

## 2022-07-30 ENCOUNTER — Telehealth: Payer: Self-pay

## 2022-07-30 NOTE — Telephone Encounter (Signed)
Transition Care Management Unsuccessful Follow-up Telephone Call  Date of discharge and from where:  [place] 5/18  Attempts:  2nd Attempt  Reason for unsuccessful TCM follow-up call:  Left voice message   Lenard Forth [facility] Guide, [facility] [phone] [address] [place], [place], [place] [address] Phone: [phone] Email: [email or web address]

## 2022-08-25 DIAGNOSIS — H2512 Age-related nuclear cataract, left eye: Secondary | ICD-10-CM | POA: Diagnosis not present

## 2022-08-25 DIAGNOSIS — Z961 Presence of intraocular lens: Secondary | ICD-10-CM | POA: Diagnosis not present

## 2022-08-25 DIAGNOSIS — H353131 Nonexudative age-related macular degeneration, bilateral, early dry stage: Secondary | ICD-10-CM | POA: Diagnosis not present

## 2022-08-26 ENCOUNTER — Ambulatory Visit: Payer: PPO | Admitting: Radiation Oncology

## 2022-09-01 DIAGNOSIS — F0390 Unspecified dementia without behavioral disturbance: Secondary | ICD-10-CM | POA: Diagnosis not present

## 2022-09-01 DIAGNOSIS — N1831 Chronic kidney disease, stage 3a: Secondary | ICD-10-CM | POA: Diagnosis not present

## 2022-09-01 DIAGNOSIS — I129 Hypertensive chronic kidney disease with stage 1 through stage 4 chronic kidney disease, or unspecified chronic kidney disease: Secondary | ICD-10-CM | POA: Diagnosis not present

## 2022-09-08 DIAGNOSIS — J4521 Mild intermittent asthma with (acute) exacerbation: Secondary | ICD-10-CM | POA: Diagnosis not present

## 2022-09-08 DIAGNOSIS — N1831 Chronic kidney disease, stage 3a: Secondary | ICD-10-CM | POA: Diagnosis not present

## 2022-09-08 DIAGNOSIS — E7800 Pure hypercholesterolemia, unspecified: Secondary | ICD-10-CM | POA: Diagnosis not present

## 2022-09-08 DIAGNOSIS — F0390 Unspecified dementia without behavioral disturbance: Secondary | ICD-10-CM | POA: Diagnosis not present

## 2022-09-08 DIAGNOSIS — E876 Hypokalemia: Secondary | ICD-10-CM | POA: Diagnosis not present

## 2022-09-08 DIAGNOSIS — I129 Hypertensive chronic kidney disease with stage 1 through stage 4 chronic kidney disease, or unspecified chronic kidney disease: Secondary | ICD-10-CM | POA: Diagnosis not present

## 2022-09-25 DIAGNOSIS — B351 Tinea unguium: Secondary | ICD-10-CM | POA: Diagnosis not present

## 2022-09-25 DIAGNOSIS — M6281 Muscle weakness (generalized): Secondary | ICD-10-CM | POA: Diagnosis not present

## 2022-09-25 DIAGNOSIS — R2689 Other abnormalities of gait and mobility: Secondary | ICD-10-CM | POA: Diagnosis not present

## 2022-09-25 DIAGNOSIS — R262 Difficulty in walking, not elsewhere classified: Secondary | ICD-10-CM | POA: Diagnosis not present

## 2022-09-25 DIAGNOSIS — M722 Plantar fascial fibromatosis: Secondary | ICD-10-CM | POA: Diagnosis not present

## 2022-09-25 DIAGNOSIS — R238 Other skin changes: Secondary | ICD-10-CM | POA: Diagnosis not present

## 2022-09-25 DIAGNOSIS — L600 Ingrowing nail: Secondary | ICD-10-CM | POA: Diagnosis not present

## 2022-11-30 DIAGNOSIS — R238 Other skin changes: Secondary | ICD-10-CM | POA: Diagnosis not present

## 2022-11-30 DIAGNOSIS — B351 Tinea unguium: Secondary | ICD-10-CM | POA: Diagnosis not present

## 2022-11-30 DIAGNOSIS — M722 Plantar fascial fibromatosis: Secondary | ICD-10-CM | POA: Diagnosis not present

## 2022-11-30 DIAGNOSIS — L600 Ingrowing nail: Secondary | ICD-10-CM | POA: Diagnosis not present

## 2022-11-30 DIAGNOSIS — R2689 Other abnormalities of gait and mobility: Secondary | ICD-10-CM | POA: Diagnosis not present

## 2022-11-30 DIAGNOSIS — M6281 Muscle weakness (generalized): Secondary | ICD-10-CM | POA: Diagnosis not present

## 2022-11-30 DIAGNOSIS — R262 Difficulty in walking, not elsewhere classified: Secondary | ICD-10-CM | POA: Diagnosis not present

## 2022-12-03 ENCOUNTER — Ambulatory Visit: Payer: PPO | Admitting: Dermatology

## 2022-12-03 ENCOUNTER — Encounter: Payer: Self-pay | Admitting: Dermatology

## 2022-12-03 VITALS — BP 141/82 | HR 79

## 2022-12-03 DIAGNOSIS — L720 Epidermal cyst: Secondary | ICD-10-CM

## 2022-12-03 DIAGNOSIS — D1801 Hemangioma of skin and subcutaneous tissue: Secondary | ICD-10-CM

## 2022-12-03 DIAGNOSIS — L820 Inflamed seborrheic keratosis: Secondary | ICD-10-CM

## 2022-12-03 DIAGNOSIS — L719 Rosacea, unspecified: Secondary | ICD-10-CM | POA: Diagnosis not present

## 2022-12-03 DIAGNOSIS — L570 Actinic keratosis: Secondary | ICD-10-CM | POA: Diagnosis not present

## 2022-12-03 DIAGNOSIS — Z7189 Other specified counseling: Secondary | ICD-10-CM

## 2022-12-03 DIAGNOSIS — L814 Other melanin hyperpigmentation: Secondary | ICD-10-CM

## 2022-12-03 DIAGNOSIS — L578 Other skin changes due to chronic exposure to nonionizing radiation: Secondary | ICD-10-CM

## 2022-12-03 DIAGNOSIS — Z1283 Encounter for screening for malignant neoplasm of skin: Secondary | ICD-10-CM | POA: Diagnosis not present

## 2022-12-03 DIAGNOSIS — L821 Other seborrheic keratosis: Secondary | ICD-10-CM | POA: Diagnosis not present

## 2022-12-03 DIAGNOSIS — L729 Follicular cyst of the skin and subcutaneous tissue, unspecified: Secondary | ICD-10-CM

## 2022-12-03 DIAGNOSIS — L853 Xerosis cutis: Secondary | ICD-10-CM | POA: Diagnosis not present

## 2022-12-03 DIAGNOSIS — Z79899 Other long term (current) drug therapy: Secondary | ICD-10-CM

## 2022-12-03 DIAGNOSIS — W908XXA Exposure to other nonionizing radiation, initial encounter: Secondary | ICD-10-CM

## 2022-12-03 DIAGNOSIS — Z85828 Personal history of other malignant neoplasm of skin: Secondary | ICD-10-CM

## 2022-12-03 DIAGNOSIS — D229 Melanocytic nevi, unspecified: Secondary | ICD-10-CM

## 2022-12-03 MED ORDER — METRONIDAZOLE 0.75 % EX CREA
TOPICAL_CREAM | CUTANEOUS | 11 refills | Status: DC
Start: 2022-12-03 — End: 2023-04-03

## 2022-12-03 NOTE — Patient Instructions (Addendum)
Seborrheic Keratosis  What causes seborrheic keratoses? Seborrheic keratoses are harmless, common skin growths that first appear during adult life.  As time goes by, more growths appear.  Some people may develop a large number of them.  Seborrheic keratoses appear on both covered and uncovered body parts.  They are not caused by sunlight.  The tendency to develop seborrheic keratoses can be inherited.  They vary in color from skin-colored to gray, brown, or even black.  They can be either smooth or have a rough, warty surface.   Seborrheic keratoses are superficial and look as if they were stuck on the skin.  Under the microscope this type of keratosis looks like layers upon layers of skin.  That is why at times the top layer may seem to fall off, but the rest of the growth remains and re-grows.    Treatment Seborrheic keratoses do not need to be treated, but can easily be removed in the office.  Seborrheic keratoses often cause symptoms when they rub on clothing or jewelry.  Lesions can be in the way of shaving.  If they become inflamed, they can cause itching, soreness, or burning.  Removal of a seborrheic keratosis can be accomplished by freezing, burning, or surgery. If any spot bleeds, scabs, or grows rapidly, please return to have it checked, as these can be an indication of a skin cancer.   Cryotherapy Aftercare  Wash gently with soap and water everyday.   Apply Vaseline and Band-Aid daily until healed.   Actinic keratoses are precancerous spots that appear secondary to cumulative UV radiation exposure/sun exposure over time. They are chronic with expected duration over 1 year. A portion of actinic keratoses will progress to squamous cell carcinoma of the skin. It is not possible to reliably predict which spots will progress to skin cancer and so treatment is recommended to prevent development of skin cancer.  Recommend daily broad spectrum sunscreen SPF 30+ to sun-exposed areas, reapply  every 2 hours as needed.  Recommend staying in the shade or wearing long sleeves, sun glasses (UVA+UVB protection) and wide brim hats (4-inch brim around the entire circumference of the hat). Call for new or changing lesions.   Recommend Cerave cream to apply daily to dry skin    Gentle Skin Care Guide  1. Bathe no more than once a day.  2. Avoid bathing in hot water  3. Use a mild soap like Dove, Vanicream, Cetaphil, CeraVe. Can use Lever 2000 or Cetaphil antibacterial soap  4. Use soap only where you need it. On most days, use it under your arms, between your legs, and on your feet. Let the water rinse other areas unless visibly dirty.  5. When you get out of the bath/shower, use a towel to gently blot your skin dry, don't rub it.  6. While your skin is still a little damp, apply a moisturizing cream such as Vanicream, CeraVe, Cetaphil, Eucerin, Sarna lotion or plain Vaseline Jelly. For hands apply Neutrogena [place] Hand Cream or Excipial Hand Cream.  7. Reapply moisturizer any time you start to itch or feel dry.  8. Sometimes using free and clear laundry detergents can be helpful. Fabric softener sheets should be avoided. Downy Free & Gentle liquid, or any liquid fabric softener that is free of dyes and perfumes, it acceptable to use  9. If your doctor has given you prescription creams you may apply moisturizers over them     Rosacea  What is rosacea? Rosacea (say: ro-zay-sha) is  a common skin disease that usually begins as a trend of flushing or blushing easily.  As rosacea progresses, a persistent redness in the center of the face will develop and may gradually spread beyond the nose and cheeks to the forehead and chin.  In some cases, the ears, chest, and back could be affected.  Rosacea may appear as tiny blood vessels or small red bumps that occur in crops.  Frequently they can contain pus, and are called "pustules".  If the bumps do not contain pus, they are referred to as  "papules".  Rarely, in prolonged, untreated cases of rosacea, the oil glands of the nose and cheeks may become permanently enlarged.  This is called rhinophyma, and is seen more frequently in men.  Signs and Risks In its beginning stages, rosacea tends to come and go, which makes it difficult to recognize.  It can start as intermittent flushing of the face.  Eventually, blood vessels may become permanently visible.  Pustules and papules can appear, but can be mistaken for adult acne.  People of all races, ages, genders and ethnic groups are at risk of developing rosacea.  However, it is more common in women (especially around menopause) and adults with fair skin between the ages of 5 and 58.  Treatment Dermatologists typically recommend a combination of treatments to effectively manage rosacea.  Treatment can improve symptoms and may stop the progression of the rosacea.  Treatment may involve both topical and oral medications.  The tetracycline antibiotics are often used for their anti-inflammatory effect; however, because of the possibility of developing antibiotic resistance, they should not be used long term at full dose.  For dilated blood vessels the options include electrodessication (uses electric current through a small needle), laser treatment, and cosmetics to hide the redness.   With all forms of treatment, improvement is a slow process, and patients may not see any results for the first 3-4 weeks.  It is very important to avoid the sun and other triggers.  Patients must wear sunscreen daily.  Skin Care Instructions: Cleanse the skin with a mild soap such as CeraVe cleanser, Cetaphil cleanser, or Dove soap once or twice daily as needed. Moisturize with Eucerin Redness Relief Daily Perfecting Lotion (has a subtle green tint), CeraVe Moisturizing Cream, or Oil of Olay Daily Moisturizer with sunscreen every morning and/or night as recommended. Makeup should be "non-comedogenic" (won't clog  pores) and be labeled "for sensitive skin". Good choices for cosmetics are: Neutrogena, Almay, and Physician's Formula.  Any product with a green tint tends to offset a red complexion. If your eyes are dry and irritated, use artificial tears 2-3 times per day and cleanse the eyelids daily with baby shampoo.  Have your eyes examined at least every 2 years.  Be sure to tell your eye doctor that you have rosacea. Alcoholic beverages tend to cause flushing of the skin, and may make rosacea worse. Always wear sunscreen, protect your skin from extreme hot and cold temperatures, and avoid spicy foods, hot drinks, and mechanical irritation such as rubbing, scrubbing, or massaging the face.  Avoid harsh skin cleansers, cleansing masks, astringents, and exfoliation. If a particular product burns or makes your face feel tight, then it is likely to flare your rosacea. If you are having difficulty finding a sunscreen that you can tolerate, you may try switching to a chemical-free sunscreen.  These are ones whose active ingredient is zinc oxide or titanium dioxide only.  They should also be fragrance free,  non-comedogenic, and labeled for sensitive skin. Rosacea triggers may vary from person to person.  There are a variety of foods that have been reported to trigger rosacea.  Some patients find that keeping a diary of what they were doing when they flared helps them avoid triggers.    Melanoma ABCDEs  Melanoma is the most dangerous type of skin cancer, and is the leading cause of death from skin disease.  You are more likely to develop melanoma if you: Have light-colored skin, light-colored eyes, or red or blond hair Spend a lot of time in the sun Tan regularly, either outdoors or in a tanning bed Have had blistering sunburns, especially during childhood Have a close family member who has had a melanoma Have atypical moles or large birthmarks  Early detection of melanoma is key since treatment is typically  straightforward and cure rates are extremely high if we catch it early.   The first sign of melanoma is often a change in a mole or a new dark spot.  The ABCDE system is a way of remembering the signs of melanoma.  A for asymmetry:  The two halves do not match. B for border:  The edges of the growth are irregular. C for color:  A mixture of colors are present instead of an even brown color. D for diameter:  Melanomas are usually (but not always) greater than 6mm - the size of a pencil eraser. E for evolution:  The spot keeps changing in size, shape, and color.  Please check your skin once per month between visits. You can use a small mirror in front and a large mirror behind you to keep an eye on the back side or your body.   If you see any new or changing lesions before your next follow-up, please call to schedule a visit.  Please continue daily skin protection including broad spectrum sunscreen SPF 30+ to sun-exposed areas, reapplying every 2 hours as needed when you're outdoors.   Staying in the shade or wearing long sleeves, sun glasses (UVA+UVB protection) and wide brim hats (4-inch brim around the entire circumference of the hat) are also recommended for sun protection.     Due to recent changes in healthcare laws, you may see results of your pathology and/or laboratory studies on MyChart before the doctors have had a chance to review them. We understand that in some cases there may be results that are confusing or concerning to you. Please understand that not all results are received at the same time and often the doctors may need to interpret multiple results in order to provide you with the best plan of care or course of treatment. Therefore, we ask that you please give [place] 2 business days to thoroughly review all your results before contacting the office for clarification. Should we see a critical lab result, you will be contacted sooner.   If You Need Anything After Your Visit  If  you have any questions or concerns for your doctor, please call our main line at [phone] and press option 4 to reach your doctor's medical assistant. If no one answers, please leave a voicemail as directed and we will return your call as soon as possible. Messages left after 4 pm will be answered the following business day.   You may also send [place] a message via MyChart. We typically respond to MyChart messages within 1-2 business days.  For prescription refills, please ask your pharmacy to contact our office. Our fax number  is [phone].  If you have an urgent issue when the clinic is closed that cannot wait until the next business day, you can page your doctor at the number below.    Please note that while we do our best to be available for urgent issues outside of office hours, we are not available 24/7.   If you have an urgent issue and are unable to reach [place], you may choose to seek medical care at your doctor's office, retail clinic, urgent care center, or emergency room.  If you have a medical emergency, please immediately call 911 or go to the emergency department.  Pager Numbers  - Dr. Gwen Pounds: [phone]  - Dr. Roseanne Reno: [phone]  - Dr. Katrinka Blazing: [phone]   In the event of inclement weather, please call our main line at [phone] for an update on the status of any delays or closures.  Dermatology Medication Tips: Please keep the boxes that topical medications come in in order to help keep track of the instructions about where and how to use these. Pharmacies typically print the medication instructions only on the boxes and not directly on the medication tubes.   If your medication is too expensive, please contact our office at [phone] option 4 or send [place] a message through MyChart.   We are unable to tell what your co-pay for medications will be in advance as this is different depending on your insurance coverage. However, we may be able to find a substitute  medication at lower cost or fill out paperwork to get insurance to cover a needed medication.   If a prior authorization is required to get your medication covered by your insurance company, please allow [place] 1-2 business days to complete this process.  Drug prices often vary depending on where the prescription is filled and some pharmacies may offer cheaper prices.  The website www.goodrx.com contains coupons for medications through different pharmacies. The prices here do not account for what the cost may be with help from insurance (it may be cheaper with your insurance), but the website can give you the price if you did not use any insurance.  - You can print the associated coupon and take it with your prescription to the pharmacy.  - You may also stop by our office during regular business hours and pick up a GoodRx coupon card.  - If you need your prescription sent electronically to a different pharmacy, notify our office through [facility] or by phone at [phone] option 4.     Si Usted Necesita Algo Despus de Su Visita  Tambin puede enviarnos un mensaje a travs de Clinical cytogeneticist. Por lo general respondemos a los mensajes de MyChart en el transcurso de 1 a 2 das hbiles.  Para renovar recetas, por favor pida a su farmacia que se ponga en contacto con nuestra oficina. Annie Sable de fax es [place] [phone].  Si tiene un asunto urgente cuando la clnica est cerrada y que no puede esperar hasta el siguiente da hbil, puede llamar/localizar a su doctor(a) al nmero que aparece a continuacin.   Por favor, tenga en cuenta que aunque hacemos todo lo posible para estar disponibles para asuntos urgentes fuera del horario de [place], no estamos disponibles las 24 horas del da, los 7 [address]  [address] de la [place].   Si tiene un problema urgente y no puede comunicarse con nosotros, puede optar por buscar atencin mdica  en el consultorio de su doctor(a), en una clnica privada, en un centro de  atencin urgente o en una sala de emergencias.  Si tiene Engineer, drilling, por favor llame inmediatamente al 911 o vaya a la sala de emergencias.  Nmeros de bper  - Dr. Gwen Pounds: [phone]  - Dra. Roseanne Reno: [phone]  - Dr. Katrinka Blazing: [phone]   En caso de inclemencias del tiempo, por favor llame a Lacy Duverney principal al [phone] para una actualizacin sobre el [place] de cualquier retraso o cierre.  Consejos para la medicacin en dermatologa: Por favor, guarde las cajas en las que vienen los medicamentos de uso tpico para ayudarle a seguir las instrucciones sobre dnde y cmo usarlos. Las farmacias generalmente imprimen las instrucciones del medicamento slo en las cajas y no directamente en los tubos del [place].   Si su medicamento es muy caro, por favor, pngase en contacto con Rolm Gala llamando al [phone] y presione la opcin 4 o envenos un mensaje a travs de Clinical cytogeneticist.   No podemos decirle cul ser su copago por los medicamentos por adelantado ya que esto es diferente dependiendo de la cobertura de su seguro. Sin embargo, es posible que podamos encontrar un medicamento sustituto a Audiological scientist un formulario para que el seguro cubra el medicamento que se considera necesario.   Si se requiere una autorizacin previa para que su compaa de seguros [place] su medicamento, por favor permtanos de 1 a 2 das hbiles para completar [address]  Los precios de los medicamentos varan con frecuencia dependiendo del Environmental consultant de dnde se surte la receta y alguna farmacias pueden ofrecer precios ms baratos.  El sitio web www.goodrx.com tiene cupones para medicamentos de Health and safety inspector. Los precios aqu no tienen en cuenta lo que podra costar con la ayuda del seguro (puede ser ms barato con su seguro), pero el sitio web puede darle el precio si no utiliz Tourist information centre manager.  - Puede imprimir el cupn correspondiente y llevarlo con su receta a la  farmacia.  - Tambin puede pasar por nuestra oficina durante el horario de atencin regular y Education officer, museum una tarjeta de cupones de GoodRx.  - Si necesita que su receta se enve electrnicamente a una farmacia diferente, informe a nuestra oficina a travs de MyChart de [place] o por telfono llamando al [phone] y presione la opcin 4.

## 2022-12-03 NOTE — Progress Notes (Signed)
Follow-Up Visit   Subjective  Hayley Nolan is a 89 y.o. female who presents for the following: Skin Cancer Screening and Full Body Skin Exam Left side face noticed a spot she would like checked.    The patient presents for Total-Body Skin Exam (TBSE) for skin cancer screening and mole check. The patient has spots, moles and lesions to be evaluated, some may be new or changing and the patient may have concern these could be cancer.    The following portions of the chart were reviewed this encounter and updated as appropriate: medications, allergies, medical history  Review of Systems:  No other skin or systemic complaints except as noted in HPI or Assessment and Plan.  Objective  Well appearing patient in no apparent distress; mood and affect are within normal limits.  A full examination was performed including scalp, head, eyes, ears, nose, lips, neck, chest, axillae, abdomen, back, buttocks, bilateral upper extremities, bilateral lower extremities, hands, feet, fingers, toes, fingernails, and toenails. All findings within normal limits unless otherwise noted below.   Relevant physical exam findings are noted in the Assessment and Plan.  left lateral nose x 1, left temple x 1 (2) Erythematous thin papules/macules with gritty scale.   left mandible neck x 1 Erythematous stuck-on, waxy papule or plaque    Assessment & Plan   SKIN CANCER SCREENING PERFORMED TODAY.  ACTINIC DAMAGE - Chronic condition, secondary to cumulative UV/sun exposure - diffuse scaly erythematous macules with underlying dyspigmentation - Recommend daily broad spectrum sunscreen SPF 30+ to sun-exposed areas, reapply every 2 hours as needed.  - Staying in the shade or wearing long sleeves, sun glasses (UVA+UVB protection) and wide brim hats (4-inch brim around the entire circumference of the hat) are also recommended for sun protection.  - Call for new or changing lesions.  MILIA Exam: tiny  erythematous firm white papule  Discussed this is a type of cyst. Benign-appearing. Sometimes these will clear with OTC adapalene/Differin 0.1% cream QHS or retinol.  Discussed extraction if symptomatic.  Treatment Plan: Symptomatic, irritating, patient would like treated. Advised may not be covered by insurance.  ROSACEA Exam Erythema at cheeks and nose   Chronic and persistent condition with duration or expected duration over one year. Condition is bothersome/symptomatic for patient. Currently flared.   Rosacea is a chronic progressive skin condition usually affecting the face of adults, causing redness and/or acne bumps. It is treatable but not curable. It sometimes affects the eyes (ocular rosacea) as well. It may respond to topical and/or systemic medication and can flare with stress, sun exposure, alcohol, exercise, topical steroids (including hydrocortisone/cortisone 10) and some foods.  Daily application of broad spectrum spf 30+ sunscreen to face is recommended to reduce flares.  Patient denies grittiness of the eyes  Treatment Plan Metrogel once or twice daily on cheeks for redness   Xerosis - diffuse xerotic patches - recommend gentle, hydrating skin care - gentle skin care handout given   LENTIGINES, SEBORRHEIC KERATOSES, HEMANGIOMAS - Benign normal skin lesions - Benign-appearing - Call for any changes  MELANOCYTIC NEVI - Tan-brown and/or pink-flesh-colored symmetric macules and papules - Benign appearing on exam today - Observation - Call clinic for new or changing moles - Recommend daily use of broad spectrum spf 30+ sunscreen to sun-exposed areas.   HISTORY OF BASAL CELL CARCINOMA OF THE SKIN Left lateral chin 05/2016 - No evidence of recurrence today - Recommend regular full body skin exams - Recommend daily broad spectrum sunscreen SPF  30+ to sun-exposed areas, reapply every 2 hours as needed.  - Call if any new or changing lesions are noted between office  visits     Rosacea  Related Medications metroNIDAZOLE (METROCREAM) 0.75 % cream Apply topically to redness at cheeks and nose 1 to 2 times daily for rosacea  Actinic keratosis (2) left lateral nose x 1, left temple x 1  Actinic keratoses are precancerous spots that appear secondary to cumulative UV radiation exposure/sun exposure over time. They are chronic with expected duration over 1 year. A portion of actinic keratoses will progress to squamous cell carcinoma of the skin. It is not possible to reliably predict which spots will progress to skin cancer and so treatment is recommended to prevent development of skin cancer.  Recommend daily broad spectrum sunscreen SPF 30+ to sun-exposed areas, reapply every 2 hours as needed.  Recommend staying in the shade or wearing long sleeves, sun glasses (UVA+UVB protection) and wide brim hats (4-inch brim around the entire circumference of the hat). Call for new or changing lesions.  Destruction of lesion - left lateral nose x 1, left temple x 1 (2) Complexity: simple   Destruction method: cryotherapy   Informed consent: discussed and consent obtained   Timeout:  patient name, date of birth, surgical site, and procedure verified Lesion destroyed using liquid nitrogen: Yes   Region frozen until ice ball extended beyond lesion: Yes   Outcome: patient tolerated procedure well with no complications   Post-procedure details: wound care instructions given    Inflamed seborrheic keratosis left mandible neck x 1  Symptomatic, irritating, patient would like treated.  Destruction of lesion - left mandible neck x 1 Complexity: simple   Destruction method: cryotherapy   Informed consent: discussed and consent obtained   Timeout:  patient name, date of birth, surgical site, and procedure verified Lesion destroyed using liquid nitrogen: Yes   Region frozen until ice ball extended beyond lesion: Yes   Outcome: patient tolerated procedure well with  no complications   Post-procedure details: wound care instructions given     Return in about 1 year (around 12/03/2023) for TBSE.  IAsher Muir, CMA, am acting as scribe for Armida Sans, MD.   Documentation: I have reviewed the above documentation for accuracy and completeness, and I agree with the above.  Armida Sans, MD

## 2022-12-04 ENCOUNTER — Encounter: Payer: Self-pay | Admitting: Dermatology

## 2023-01-21 DIAGNOSIS — H6123 Impacted cerumen, bilateral: Secondary | ICD-10-CM | POA: Diagnosis not present

## 2023-01-21 DIAGNOSIS — H903 Sensorineural hearing loss, bilateral: Secondary | ICD-10-CM | POA: Diagnosis not present

## 2023-02-08 DIAGNOSIS — B351 Tinea unguium: Secondary | ICD-10-CM | POA: Diagnosis not present

## 2023-02-08 DIAGNOSIS — M6281 Muscle weakness (generalized): Secondary | ICD-10-CM | POA: Diagnosis not present

## 2023-02-08 DIAGNOSIS — R2689 Other abnormalities of gait and mobility: Secondary | ICD-10-CM | POA: Diagnosis not present

## 2023-02-08 DIAGNOSIS — R262 Difficulty in walking, not elsewhere classified: Secondary | ICD-10-CM | POA: Diagnosis not present

## 2023-02-08 DIAGNOSIS — R238 Other skin changes: Secondary | ICD-10-CM | POA: Diagnosis not present

## 2023-03-02 DIAGNOSIS — H2512 Age-related nuclear cataract, left eye: Secondary | ICD-10-CM | POA: Diagnosis not present

## 2023-03-02 DIAGNOSIS — Z961 Presence of intraocular lens: Secondary | ICD-10-CM | POA: Diagnosis not present

## 2023-03-18 ENCOUNTER — Inpatient Hospital Stay: Payer: PPO | Admitting: Oncology

## 2023-03-18 ENCOUNTER — Inpatient Hospital Stay: Payer: PPO

## 2023-03-18 DIAGNOSIS — N1831 Chronic kidney disease, stage 3a: Secondary | ICD-10-CM | POA: Diagnosis not present

## 2023-03-18 DIAGNOSIS — E7800 Pure hypercholesterolemia, unspecified: Secondary | ICD-10-CM | POA: Diagnosis not present

## 2023-03-18 DIAGNOSIS — J4521 Mild intermittent asthma with (acute) exacerbation: Secondary | ICD-10-CM | POA: Diagnosis not present

## 2023-03-18 DIAGNOSIS — I129 Hypertensive chronic kidney disease with stage 1 through stage 4 chronic kidney disease, or unspecified chronic kidney disease: Secondary | ICD-10-CM | POA: Diagnosis not present

## 2023-03-23 DIAGNOSIS — E7800 Pure hypercholesterolemia, unspecified: Secondary | ICD-10-CM | POA: Diagnosis not present

## 2023-03-23 DIAGNOSIS — I129 Hypertensive chronic kidney disease with stage 1 through stage 4 chronic kidney disease, or unspecified chronic kidney disease: Secondary | ICD-10-CM | POA: Diagnosis not present

## 2023-03-23 DIAGNOSIS — J4521 Mild intermittent asthma with (acute) exacerbation: Secondary | ICD-10-CM | POA: Diagnosis not present

## 2023-03-23 DIAGNOSIS — Z0000 Encounter for general adult medical examination without abnormal findings: Secondary | ICD-10-CM | POA: Diagnosis not present

## 2023-03-23 DIAGNOSIS — N1831 Chronic kidney disease, stage 3a: Secondary | ICD-10-CM | POA: Diagnosis not present

## 2023-03-23 DIAGNOSIS — R3915 Urgency of urination: Secondary | ICD-10-CM | POA: Diagnosis not present

## 2023-03-23 DIAGNOSIS — F0390 Unspecified dementia without behavioral disturbance: Secondary | ICD-10-CM | POA: Diagnosis not present

## 2023-03-29 ENCOUNTER — Ambulatory Visit
Admission: RE | Admit: 2023-03-29 | Discharge: 2023-03-29 | Disposition: A | Discharge status: Home / Self Care | Payer: PPO | Source: Ambulatory Visit | Attending: Oncology | Admitting: Oncology

## 2023-03-29 DIAGNOSIS — M810 Age-related osteoporosis without current pathological fracture: Secondary | ICD-10-CM | POA: Insufficient documentation

## 2023-03-29 DIAGNOSIS — C50411 Malignant neoplasm of upper-outer quadrant of right female breast: Secondary | ICD-10-CM | POA: Diagnosis not present

## 2023-03-29 DIAGNOSIS — M85851 Other specified disorders of bone density and structure, right thigh: Secondary | ICD-10-CM | POA: Diagnosis not present

## 2023-03-29 DIAGNOSIS — R92333 Mammographic heterogeneous density, bilateral breasts: Secondary | ICD-10-CM | POA: Diagnosis not present

## 2023-04-01 ENCOUNTER — Inpatient Hospital Stay
Admission: EM | Admit: 2023-04-01 | Discharge: 2023-04-03 | DRG: 378 | Disposition: A | Discharge status: Home / Self Care | Payer: PPO | Attending: Internal Medicine | Admitting: Internal Medicine

## 2023-04-01 ENCOUNTER — Other Ambulatory Visit: Payer: Self-pay

## 2023-04-01 ENCOUNTER — Emergency Department: Payer: PPO

## 2023-04-01 DIAGNOSIS — Z79810 Long term (current) use of selective estrogen receptor modulators (SERMs): Secondary | ICD-10-CM | POA: Diagnosis not present

## 2023-04-01 DIAGNOSIS — K5791 Diverticulosis of intestine, part unspecified, without perforation or abscess with bleeding: Secondary | ICD-10-CM | POA: Diagnosis not present

## 2023-04-01 DIAGNOSIS — K5731 Diverticulosis of large intestine without perforation or abscess with bleeding: Secondary | ICD-10-CM | POA: Diagnosis not present

## 2023-04-01 DIAGNOSIS — K625 Hemorrhage of anus and rectum: Secondary | ICD-10-CM | POA: Diagnosis not present

## 2023-04-01 DIAGNOSIS — R109 Unspecified abdominal pain: Secondary | ICD-10-CM | POA: Diagnosis not present

## 2023-04-01 DIAGNOSIS — Z923 Personal history of irradiation: Secondary | ICD-10-CM

## 2023-04-01 DIAGNOSIS — R42 Dizziness and giddiness: Secondary | ICD-10-CM | POA: Diagnosis not present

## 2023-04-01 DIAGNOSIS — L719 Rosacea, unspecified: Secondary | ICD-10-CM | POA: Diagnosis not present

## 2023-04-01 DIAGNOSIS — Z79899 Other long term (current) drug therapy: Secondary | ICD-10-CM | POA: Diagnosis not present

## 2023-04-01 DIAGNOSIS — Z85828 Personal history of other malignant neoplasm of skin: Secondary | ICD-10-CM

## 2023-04-01 DIAGNOSIS — D630 Anemia in neoplastic disease: Secondary | ICD-10-CM | POA: Diagnosis not present

## 2023-04-01 DIAGNOSIS — Z853 Personal history of malignant neoplasm of breast: Secondary | ICD-10-CM | POA: Diagnosis not present

## 2023-04-01 DIAGNOSIS — K219 Gastro-esophageal reflux disease without esophagitis: Secondary | ICD-10-CM | POA: Diagnosis not present

## 2023-04-01 DIAGNOSIS — N183 Chronic kidney disease, stage 3 unspecified: Secondary | ICD-10-CM | POA: Diagnosis not present

## 2023-04-01 DIAGNOSIS — Z9221 Personal history of antineoplastic chemotherapy: Secondary | ICD-10-CM | POA: Diagnosis not present

## 2023-04-01 DIAGNOSIS — C50411 Malignant neoplasm of upper-outer quadrant of right female breast: Secondary | ICD-10-CM | POA: Diagnosis not present

## 2023-04-01 DIAGNOSIS — Z7951 Long term (current) use of inhaled steroids: Secondary | ICD-10-CM

## 2023-04-01 DIAGNOSIS — I129 Hypertensive chronic kidney disease with stage 1 through stage 4 chronic kidney disease, or unspecified chronic kidney disease: Secondary | ICD-10-CM | POA: Diagnosis not present

## 2023-04-01 DIAGNOSIS — D62 Acute posthemorrhagic anemia: Secondary | ICD-10-CM | POA: Diagnosis not present

## 2023-04-01 DIAGNOSIS — F0390 Unspecified dementia without behavioral disturbance: Secondary | ICD-10-CM | POA: Diagnosis present

## 2023-04-01 DIAGNOSIS — Z881 Allergy status to other antibiotic agents status: Secondary | ICD-10-CM

## 2023-04-01 DIAGNOSIS — R58 Hemorrhage, not elsewhere classified: Secondary | ICD-10-CM | POA: Diagnosis not present

## 2023-04-01 DIAGNOSIS — C50919 Malignant neoplasm of unspecified site of unspecified female breast: Secondary | ICD-10-CM

## 2023-04-01 DIAGNOSIS — Z7983 Long term (current) use of bisphosphonates: Secondary | ICD-10-CM

## 2023-04-01 DIAGNOSIS — D631 Anemia in chronic kidney disease: Secondary | ICD-10-CM | POA: Diagnosis not present

## 2023-04-01 DIAGNOSIS — Z9012 Acquired absence of left breast and nipple: Secondary | ICD-10-CM | POA: Diagnosis not present

## 2023-04-01 DIAGNOSIS — J45909 Unspecified asthma, uncomplicated: Secondary | ICD-10-CM | POA: Diagnosis present

## 2023-04-01 DIAGNOSIS — N3289 Other specified disorders of bladder: Secondary | ICD-10-CM | POA: Diagnosis not present

## 2023-04-01 DIAGNOSIS — K921 Melena: Secondary | ICD-10-CM | POA: Diagnosis not present

## 2023-04-01 DIAGNOSIS — K5730 Diverticulosis of large intestine without perforation or abscess without bleeding: Secondary | ICD-10-CM | POA: Diagnosis not present

## 2023-04-01 LAB — COMPREHENSIVE METABOLIC PANEL
ALT: 12 U/L (ref 0–44)
AST: 16 U/L (ref 15–41)
Albumin: 2.9 g/dL — ABNORMAL LOW (ref 3.5–5.0)
Alkaline Phosphatase: 29 U/L — ABNORMAL LOW (ref 38–126)
Anion gap: 7 (ref 5–15)
BUN: 37 mg/dL — ABNORMAL HIGH (ref 8–23)
CO2: 25 mmol/L (ref 22–32)
Calcium: 8.3 mg/dL — ABNORMAL LOW (ref 8.9–10.3)
Chloride: 108 mmol/L (ref 98–111)
Creatinine, Ser: 1.06 mg/dL — ABNORMAL HIGH (ref 0.44–1.00)
GFR, Estimated: 49 mL/min — ABNORMAL LOW (ref >60)
Glucose, Bld: 140 mg/dL — ABNORMAL HIGH (ref 70–99)
Potassium: 4.2 mmol/L (ref 3.5–5.1)
Sodium: 140 mmol/L (ref 135–145)
Total Bilirubin: 0.4 mg/dL (ref 0.0–1.2)
Total Protein: 4.7 g/dL — ABNORMAL LOW (ref 6.5–8.1)

## 2023-04-01 LAB — TYPE AND SCREEN
ABO/RH(D): O POS
Antibody Screen: NEGATIVE

## 2023-04-01 LAB — CBC WITH DIFFERENTIAL/PLATELET
Abs Immature Granulocytes: 0.08 10*3/uL — ABNORMAL HIGH (ref 0.00–0.07)
Basophils Absolute: 0 10*3/uL (ref 0.0–0.1)
Basophils Relative: 0 %
Eosinophils Absolute: 0.1 10*3/uL (ref 0.0–0.5)
Eosinophils Relative: 1 %
HCT: 28.4 % — ABNORMAL LOW (ref 36.0–46.0)
Hemoglobin: 9.3 g/dL — ABNORMAL LOW (ref 12.0–15.0)
Immature Granulocytes: 1 %
Lymphocytes Relative: 10 %
Lymphs Abs: 1.2 10*3/uL (ref 0.7–4.0)
MCH: 29.8 pg (ref 26.0–34.0)
MCHC: 32.7 g/dL (ref 30.0–36.0)
MCV: 91 fL (ref 80.0–100.0)
Monocytes Absolute: 1 10*3/uL (ref 0.1–1.0)
Monocytes Relative: 8 %
Neutro Abs: 9.7 10*3/uL — ABNORMAL HIGH (ref 1.7–7.7)
Neutrophils Relative %: 80 %
Platelets: 198 10*3/uL (ref 150–400)
RBC: 3.12 MIL/uL — ABNORMAL LOW (ref 3.87–5.11)
RDW: 14.4 % (ref 11.5–15.5)
WBC: 12.1 10*3/uL — ABNORMAL HIGH (ref 4.0–10.5)
nRBC: 0 % (ref 0.0–0.2)

## 2023-04-01 LAB — HEMOGLOBIN
Hemoglobin: 9.4 g/dL — ABNORMAL LOW (ref 12.0–15.0)
Hemoglobin: 8.5 g/dL — ABNORMAL LOW (ref 12.0–15.0)
Hemoglobin: 7.6 g/dL — ABNORMAL LOW (ref 12.0–15.0)

## 2023-04-01 LAB — PROTIME-INR
INR: 1.2 (ref 0.8–1.2)
Prothrombin Time: 15.1 s (ref 11.4–15.2)

## 2023-04-01 LAB — LIPASE, BLOOD: Lipase: 47 U/L (ref 11–51)

## 2023-04-01 LAB — TROPONIN I (HIGH SENSITIVITY): Troponin I (High Sensitivity): 10 ng/L (ref <18)

## 2023-04-01 MED ORDER — PANTOPRAZOLE SODIUM 40 MG IV SOLR
80.0000 mg | Freq: Once | INTRAVENOUS | Status: AC
  Administered 2023-04-01: 80 mg via INTRAVENOUS
  Filled 2023-04-01: qty 20

## 2023-04-01 MED ORDER — ONDANSETRON HCL 4 MG PO TABS: 4.0000 mg | ORAL_TABLET | Freq: Four times a day (QID) | ORAL | Status: DC | PRN

## 2023-04-01 MED ORDER — IOHEXOL 350 MG/ML SOLN
100.0000 mL | Freq: Once | INTRAVENOUS | Status: AC | PRN
  Administered 2023-04-01: 100 mL via INTRAVENOUS

## 2023-04-01 MED ORDER — ONDANSETRON HCL 4 MG/2ML IJ SOLN
4.0000 mg | Freq: Four times a day (QID) | INTRAMUSCULAR | Status: DC | PRN
  Filled 2023-04-01 (×2): qty 2

## 2023-04-01 MED ORDER — SODIUM CHLORIDE 0.9 % IV SOLN: INTRAVENOUS | Status: AC

## 2023-04-01 MED ORDER — SODIUM CHLORIDE 0.9 % IV BOLUS
1000.0000 mL | Freq: Once | INTRAVENOUS | Status: AC
  Administered 2023-04-01: 1000 mL via INTRAVENOUS

## 2023-04-01 MED ORDER — ALBUTEROL SULFATE (2.5 MG/3ML) 0.083% IN NEBU
2.5000 mg | INHALATION_SOLUTION | RESPIRATORY_TRACT | Status: DC | PRN
  Administered 2023-04-01: 2.5 mg via RESPIRATORY_TRACT
  Filled 2023-04-01: qty 3

## 2023-04-01 NOTE — Assessment & Plan Note (Signed)
Stable from resp standpoint Cont home inhalers

## 2023-04-01 NOTE — TOC Progression Note (Signed)
Transition of Care [facility]) - Progression Note    Patient Details  Name: Hayley Nolan MRN: [id number] Date of Birth: October 25, 1927  [facility] CM/SW Contact  Tory Emerald, [place] Phone Number: 04/01/2023, 11:06 AM  Clinical Narrative:     CSW noted [facility] consult; however, CSW unable to complete consult until PT/OT complete assessment and make recommendations. CSW asked attending to re-consult TOC once PT/OT assess and make recs. CSW closing consult.         Expected Discharge Plan and Services                                               Social Determinants of Health (SDOH) Interventions SDOH Screenings   Food Insecurity: No Food Insecurity (03/23/2023)   Received from [facility]  Housing: Unknown (03/23/2023)   Received from [facility]  Transportation Needs: No Transportation Needs (03/23/2023)   [facility] from [facility]  Utilities: Not At Risk (03/23/2023)   Received from [facility]  Financial Resource Strain: Low Risk  (03/23/2023)   Received from [facility]  Tobacco Use: Low Risk  (04/01/2023)    Readmission Risk Interventions     No data to display

## 2023-04-01 NOTE — ED Notes (Incomplete)
BSC brought into rm at this time. Pt assisted to stand and pivot to [facility] to urinate.

## 2023-04-01 NOTE — Assessment & Plan Note (Signed)
BP stable Titrate home regimen

## 2023-04-01 NOTE — ED Notes (Signed)
Pt ambulated with walker to the bathroom with steady gait. Pt back into bed, family at bedside. PT has her home inhaler with her and educated that she is supposed to use hospital provided medications so it is properly documented.

## 2023-04-01 NOTE — Progress Notes (Signed)
OT Screen Note  Patient Details Name: SUAN PYEATT MRN: [id number] DOB: 06-09-1927   Cancelled Treatment:    Reason Eval/Treat Not Completed: OT screened, no needs identified, will sign off. Consult received, chart reviewed. PT/family reports pt at baseline and has no skilled OT needs. Will sign off.   Arman Filter., MPH, MS, OTR/L ascom [phone] 04/01/23, 4:52 PM

## 2023-04-01 NOTE — Plan of Care (Signed)

## 2023-04-01 NOTE — Assessment & Plan Note (Addendum)
Acute blood loss anemia Lower GI bleeding with concern for diverticular etiology Hemoglobin 13.3-->9.3 Will trend hemoglobin Check anemia panel Transfuse for hemoglobin less than 7 Hold offending agents Otherwise monitor

## 2023-04-01 NOTE — ED Notes (Signed)
Pt assisted to use BSC. Pt has no further needs at this time.

## 2023-04-01 NOTE — ED Provider Notes (Signed)
[facility] Provider Note    Event Date/Time   First MD Initiated Contact with Patient 04/01/23 [phone]     (approximate)   History   No chief complaint on file.   HPI  Hayley Nolan is a 89 year old female presenting to the emergency department for evaluation of rectal bleeding.  Shortly prior to presentation, patient woke up and noticed that she felt wet, thought that she had stooled on herself.  Nurse at her facility helped her and noted that she had bright red blood and she was directed to the ER.  Denies history of similar.  Denies abdominal pain.  No fevers or chills.  No history of diverticulosis.     Physical Exam   Triage Vital Signs: ED Triage Vitals  Encounter Vitals Group     BP 04/01/23 0055 121/63     Systolic BP Percentile --      Diastolic BP Percentile --      Pulse Rate 04/01/23 0055 99     Resp --      Temp 04/01/23 0048 98.5 F (36.9 C)     Temp Source 04/01/23 0048 Oral     SpO2 04/01/23 0055 97 %     Weight 04/01/23 0048 129 lb (58.5 kg)     Height 04/01/23 0048 5\' 5"  (1.651 m)     Head Circumference --      Peak Flow --      Pain Score 04/01/23 0054 0     Pain Loc --      Pain Education --      Exclude from Growth Chart --     Most recent vital signs: Vitals:   04/01/23 0048 04/01/23 0055  BP:  121/63  Pulse:  99  Temp: 98.5 F (36.9 C)   SpO2:  97%     General: Awake, interactive  CV:  Regular rate, good peripheral perfusion.  Resp:  Unlabored respirations, lungs good auscultation Abd:  Nondistended, soft, nontender to palpation, red to maroon-colored overt blood on rectal exam Neuro:  Symmetric facial movement, fluid speech   ED Results / Procedures / Treatments   Labs (all labs ordered are listed, but only abnormal results are displayed) Labs Reviewed  CBC WITH DIFFERENTIAL/PLATELET - Abnormal; Notable for the following components:      Result Value   WBC 12.1 (*)    RBC 3.12 (*)    Hemoglobin  9.3 (*)    HCT 28.4 (*)    Neutro Abs 9.7 (*)    Abs Immature Granulocytes 0.08 (*)    All other components within normal limits  COMPREHENSIVE METABOLIC PANEL - Abnormal; Notable for the following components:   Glucose, Bld 140 (*)    BUN 37 (*)    Creatinine, Ser 1.06 (*)    Calcium 8.3 (*)    Total Protein 4.7 (*)    Albumin 2.9 (*)    Alkaline Phosphatase 29 (*)    GFR, Estimated 49 (*)    All other components within normal limits  LIPASE, BLOOD  PROTIME-INR  TYPE AND SCREEN  TROPONIN I (HIGH SENSITIVITY)     EKG EKG independently reviewed interpreted by myself (ER attending) demonstrates:    RADIOLOGY Imaging independently reviewed and interpreted by myself demonstrates:  CTA GI bleed protocol without obvious active bleed, but does demonstrate diverticulosis  PROCEDURES:  Critical Care performed: Yes, see critical care procedure note(s)  CRITICAL CARE Performed by: Trinna Post   Total critical care time:  32 minutes  Critical care time was exclusive of separately billable procedures and treating other patients.  Critical care was necessary to treat or prevent imminent or life-threatening deterioration.  Critical care was time spent personally by me on the following activities: development of treatment plan with patient and/or surrogate as well as nursing, discussions with consultants, evaluation of patient's response to treatment, examination of patient, obtaining history from patient or surrogate, ordering and performing treatments and interventions, ordering and review of laboratory studies, ordering and review of radiographic studies, pulse oximetry and re-evaluation of patient's condition.   Procedures   MEDICATIONS ORDERED IN ED: Medications  sodium chloride 0.9 % bolus 1,000 mL (1,000 mLs Intravenous New Bag/Given 04/01/23 0159)  iohexol (OMNIPAQUE) 350 MG/ML injection 100 mL (100 mLs Intravenous Contrast Given 04/01/23 0216)  pantoprazole (PROTONIX)  injection 80 mg (80 mg Intravenous Given 04/01/23 0307)     IMPRESSION / MDM / ASSESSMENT AND PLAN / ED COURSE  I reviewed the triage vital signs and the nursing notes.  Differential diagnosis includes, but is not limited to, diverticulosis, AVM, no obvious hemorrhoids on exam, upper GI bleed source  Patient's presentation is most consistent with acute presentation with potential threat to life or bodily function.  89 year old female presenting with rectal bleeding.  Stable vitals on exam.  Obvious bleeding on rectal exam.  With recent onset and bright red blood reported, will obtain CTA GI bleed protocol to further evaluate.  Labs with acute hemoglobin drop from 13 eight months ago to 9.3. CTA demonstrates no active bleed, diverticulosis noted.  Patient without further bleeding episodes here.  Did discuss admission and patient is agreeable with this plan.  Reports she is a full code.  Will reach out to hospitalist team.  Case reviewed with Dr. Para March.  She will evaluate the patient for anticipated admission.      FINAL CLINICAL IMPRESSION(S) / ED DIAGNOSES   Final diagnoses:  Rectal bleeding     Rx / DC Orders   ED Discharge Orders     None        Note:  This document was prepared using Dragon voice recognition software and may include unintentional dictation errors.   Trinna Post, MD 04/01/23 [phone]

## 2023-04-01 NOTE — ED Triage Notes (Signed)
Pt arrives via [facility] from [facility] with CC of large amount of dark red stools that started prior to arrival. Dizziness when standing. A&Ox4. Denies blood thinners.

## 2023-04-01 NOTE — Assessment & Plan Note (Signed)
Followed by Dr. Orlie Dakin On tamoxifen Monitor

## 2023-04-01 NOTE — H&P (Signed)
History and Physical    Patient: Hayley Nolan ZOX:[id number] DOB: 1927-12-05 DOA: 04/01/2023 DOS: the patient was seen and examined on 04/01/2023 PCP: Lauro Regulus, MD  Patient coming from: Home  Chief Complaint: No chief complaint on file.  HPI: Hayley Nolan is a 89 y.o. female with medical history significant of asthma, breast cancer, HTN presenting w/ diverticular bleeding. Pt resident of [facility]. Per report patient w/ large dark red bowel movement earlier today. Pt reported adominal fullness prior to event. Denies any chest pain, SOB, abd pain. No fevers or chills. No reported antiplatelet, NSAID or anticoagulant use. No focal hemiparesis or confusion. No reported falls or head trauma.  Presented to ER afebrile, hemodynamically stable. Hgb 13.3-->9.3. Cr 1.06. labs otherwise stable. CT Angio GI bleed with noted diverticulosis without diverticulitis.  Review of Systems: As mentioned in the history of present illness. All other systems reviewed and are negative. Past Medical History:  Diagnosis Date   Asthma    Basal cell carcinoma 06/29/2016   L lat chin nodular pattern   Breast cancer (HCC) 1999   right breast   Breast cancer (HCC) 12/2018   left breast   Cancer (HCC) 1999   Left breast pre cancerous- mastectomy with reconstruction   Hypertension    Personal history of chemotherapy    Past Surgical History:  Procedure Laterality Date   AUGMENTATION MAMMAPLASTY Left 1999   BREAST BIOPSY Right 11/30/2018   affirm stereo bx of calcs, x marker, positive   BREAST BIOPSY Right 11/30/2018   affirm bx of calcs, ribbon marker,positive   BREAST LUMPECTOMY Left 12/16/2018   BREAST LUMPECTOMY WITH NEEDLE LOCALIZATION AND AXILLARY SENTINEL LYMPH NODE BX Right 12/16/2018   Procedure: BREAST LUMPECTOMY WITH NEEDLE LOCALIZATION AND AXILLARY SENTINEL LYMPH NODE BX;  Surgeon: Earline Mayotte, MD;  Location: [facility] ORS;  Service: General;  Laterality: Right;    KIDNEY CYST REMOVAL     MASTECTOMY Left 1999   Implant   REDUCTION MAMMAPLASTY Right 1997   Social History:  reports that she has never smoked. She has never used smokeless tobacco. She reports that she does not drink alcohol and does not use drugs.  Allergies  Allergen Reactions   Cephalexin Other (See Comments)    Possible headache     Family History  Problem Relation Age of Onset   Breast cancer Neg Hx     Prior to Admission medications   Medication Sig Start Date End Date Taking? Authorizing Provider  ADVAIR DISKUS 250-50 MCG/DOSE AEPB Inhale 1 puff into the lungs every 12 (twelve) hours. 03/03/18  Yes [provider]  alendronate (FOSAMAX) 70 MG tablet TAKE 1 TABLET EVERY 7 DAYS WITH A FULL GLASS OF WATER ON AN EMPTY STOMACH DO NOT LIE DOWN FOR AT LEAST 30 MIN 04/24/21  Yes Jeralyn Ruths, MD  Biotin 5 MG CAPS Take 5 mg by mouth daily.   Yes [provider]  Cholecalciferol 25 MCG (1000 UT) tablet Take 1,000 Units by mouth daily.   Yes [provider]  latanoprost (XALATAN) 0.005 % ophthalmic solution Place 1 drop into both eyes at bedtime.   Yes [provider]  Multiple Vitamins-Minerals (MULTIVITAL PLATINUM SILVER PO) Take 1 tablet by mouth daily.   Yes [provider]  Multiple Vitamins-Minerals (PRESERVISION AREDS) CAPS Take 1 capsule by mouth 2 (two) times daily.   Yes [provider]  potassium chloride (K-DUR,KLOR-CON) 10 MEQ tablet Take 10 mEq by mouth 2 (two)  times daily. 04/30/18  Yes [provider]  PROAIR HFA 108 (90 Base) MCG/ACT inhaler Inhale 2 puffs into the lungs every 4 (four) hours as needed for wheezing. 04/02/18  Yes [provider]  spironolactone (ALDACTONE) 25 MG tablet Take 25 mg by mouth daily. 07/09/20  Yes [provider]  tamoxifen (NOLVADEX) 10 MG tablet TAKE ONE (1) TABLET BY MOUTH TWO TIMES PER DAY 06/30/22  Yes Jeralyn Ruths, MD  metroNIDAZOLE (METROCREAM) 0.75 %  cream Apply topically to redness at cheeks and nose 1 to 2 times daily for rosacea Patient not taking: Reported on 04/01/2023 12/03/22   Deirdre Evener, MD  mupirocin ointment (BACTROBAN) 2 % Apply 1 Application topically 3 (three) times daily. Patient not taking: Reported on 04/01/2023 12/15/21   Deirdre Evener, MD  trastuzumab-dkst 2 mg/kg in sodium chloride 0.9 % 250 mL Inject 2 mg/kg into the vein once. Patient not taking: Reported on 04/01/2023    [provider]    Physical Exam: Vitals:   04/01/23 0048 04/01/23 0055 04/01/23 0749  BP:  121/63 120/60  Pulse:  99 98  Resp:   (!) 22  Temp: 98.5 F (36.9 C)  98 F (36.7 C)  TempSrc: Oral  Oral  SpO2:  97% 97%  Weight: 58.5 kg    Height: 5\' 5"  (1.651 m)     Physical Exam Constitutional:      Appearance: She is normal weight.  HENT:     Head: Normocephalic and atraumatic.     Nose: Nose normal.     Mouth/Throat:     Mouth: Mucous membranes are moist.  Cardiovascular:     Rate and Rhythm: Normal rate and regular rhythm.  Pulmonary:     Effort: Pulmonary effort is normal.  Abdominal:     General: Bowel sounds are normal.  Musculoskeletal:     Cervical back: Normal range of motion.  Skin:    General: Skin is warm.  Neurological:     General: No focal deficit present.  Psychiatric:        Mood and Affect: Mood normal.     Data Reviewed:  There are no new results to review at this time.  CT ANGIO GI BLEED CLINICAL DATA:  Left-sided abdominal pain and dark red stools, initial encounter  EXAM: CTA ABDOMEN AND PELVIS WITHOUT AND WITH CONTRAST  TECHNIQUE: Multidetector CT imaging of the abdomen and pelvis was performed using the standard protocol during bolus administration of intravenous contrast. Multiplanar reconstructed images and MIPs were obtained and reviewed to evaluate the vascular anatomy.  RADIATION DOSE REDUCTION: This exam was performed according to the departmental dose-optimization  program which includes automated exposure control, adjustment of the mA and/or kV according to patient size and/or use of iterative reconstruction technique.  CONTRAST:  OMNIPAQUE IOHEXOL 350 MG/ML SOLN  COMPARISON:  None Available.  FINDINGS: VASCULAR  Aorta: Abdominal aorta demonstrates atherosclerotic calcification without aneurysmal dilatation or dissection.  Celiac: Patent without evidence of aneurysm, dissection, vasculitis or significant stenosis.  SMA: Patent without evidence of aneurysm, dissection, vasculitis or significant stenosis.  Renals: Both renal arteries are patent without evidence of aneurysm, dissection, vasculitis, fibromuscular dysplasia or significant stenosis.  IMA: Patent without evidence of aneurysm, dissection, vasculitis or significant stenosis.  Inflow: Iliacs demonstrate atherosclerotic calcification without focal stenosis.  Veins: No specific venous abnormality is noted.  Review of the MIP images confirms the above findings.  NON-VASCULAR  Lower chest: No acute abnormality.  Left breast implant  is noted.  Hepatobiliary: Multiple hepatic cysts are identified. The largest of these demonstrates peripheral calcification. No significant interval changes noted. The gallbladder is decompressed with multiple stones within.  Pancreas: Scattered pancreatic cysts are noted. The largest of these lies in the tail measuring 20 mm. Given the patient's age no further follow-up is recommended.  Spleen: Normal in size without focal abnormality.  Adrenals/Urinary Tract: Multiple renal cysts are noted. No follow-up is recommended. No renal calculi or obstructive changes are seen. The bladder is well distended.  Stomach/Bowel: Scattered diverticular change of the colon is noted without evidence of diverticulitis. The appendix is well visualized and within normal limits. No pooling of contrast to suggest active GI hemorrhage is seen. Stomach and  small bowel appear within normal limits.  Lymphatic: No lymphadenopathy is noted.  Reproductive: Status post hysterectomy. No adnexal masses.  Other: No abdominal wall hernia or abnormality. No abdominopelvic ascites.  Musculoskeletal: No acute or significant osseous findings.  IMPRESSION: VASCULAR  No findings to suggest active GI hemorrhage.  Scattered atherosclerotic changes are seen.  NON-VASCULAR  Diverticulosis without diverticulitis.  Hepatic, pancreatic and renal cysts. Given the patient's age and appearance no further follow-up is recommended.  Electronically Signed   By: Alcide Clever M.D.   On: 04/01/2023 03:29  Lab Results  Component Value Date   WBC 12.1 (H) 04/01/2023   HGB 9.3 (L) 04/01/2023   HCT 28.4 (L) 04/01/2023   MCV 91.0 04/01/2023   PLT 198 04/01/2023   Last metabolic panel Lab Results  Component Value Date   GLUCOSE 140 (H) 04/01/2023   NA 140 04/01/2023   K 4.2 04/01/2023   CL 108 04/01/2023   CO2 25 04/01/2023   BUN 37 (H) 04/01/2023   CREATININE 1.06 (H) 04/01/2023   GFRNONAA 49 (L) 04/01/2023   CALCIUM 8.3 (L) 04/01/2023   PROT 4.7 (L) 04/01/2023   ALBUMIN 2.9 (L) 04/01/2023   BILITOT 0.4 04/01/2023   ALKPHOS 29 (L) 04/01/2023   AST 16 04/01/2023   ALT 12 04/01/2023   ANIONGAP 7 04/01/2023    Assessment and Plan: * Rectal bleeding Acute blood loss anemia Lower GI bleeding with concern for diverticular etiology Hemoglobin 13.3-->9.3 Will trend hemoglobin Check anemia panel Transfuse for hemoglobin less than 7 Hold offending agents Otherwise monitor  Hypertensive kidney disease with CKD stage III (HCC) BP stable  Titrate home regimen    GERD (gastroesophageal reflux disease) PPI  Asthma Stable from resp standpoint Cont home inhalers    Primary cancer of upper outer quadrant of right female breast (HCC) Followed by Dr. Orlie Dakin On tamoxifen Monitor      Advance Care Planning:   Code Status: Full Code    Consults: None at present. Consider GI if significant clinical decline   Family Communication: Daughters at the bedside   Severity of Illness: The appropriate patient status for this patient is INPATIENT. Inpatient status is judged to be reasonable and necessary in order to provide the required intensity of service to ensure the patient's safety. The patient's presenting symptoms, physical exam findings, and initial radiographic and laboratory data in the context of their chronic comorbidities is felt to place them at high risk for further clinical deterioration. Furthermore, it is not anticipated that the patient will be medically stable for discharge from the hospital within 2 midnights of admission.   * I certify that at the point of admission it is my clinical judgment that the patient will require inpatient hospital care spanning beyond 2  midnights from the point of admission due to high intensity of service, high risk for further deterioration and high frequency of surveillance required.*  Author: Floydene Flock, MD 04/01/2023 11:02 AM  For on call review [email or web address].

## 2023-04-01 NOTE — Progress Notes (Signed)
OT Cancellation Note  Patient Details Name: Hayley Nolan MRN: [id number] DOB: 1928-02-02   Cancelled Treatment:    Reason Eval/Treat Not Completed: Other (comment). Orders received, chart reviewed. Pt just receiving lunch upon arrival, requests that OT return at later time. OT will check back as able.  Rokhaya Quinn L. Brookelle Pellicane, OTR/L  04/01/23, 12:13 PM

## 2023-04-01 NOTE — Progress Notes (Signed)
PT Cancellation Note  Patient Details Name: Hayley Nolan MRN: [id number] DOB: 05-21-1927   Cancelled Treatment:    Reason Eval/Treat Not Completed: Other (comment). Pt received seated in chair. Confirmed that she has been mobile with RN staff using RW which is her baseline. She reports she doesn't receive PT at facility and doesn't feel need for it at this time. Indep with ADLs, walks to dining hall. Family at bedside in agreement and said they would reach out if needs change. Messaged MD -will complete consult at this time.   Bethany Cumming 04/01/2023, 4:27 PM Elizabeth Palau, PT, DPT, GCS [phone]

## 2023-04-01 NOTE — Assessment & Plan Note (Signed)
PPI ?

## 2023-04-02 DIAGNOSIS — K625 Hemorrhage of anus and rectum: Secondary | ICD-10-CM | POA: Diagnosis not present

## 2023-04-02 LAB — COMPREHENSIVE METABOLIC PANEL
ALT: 12 U/L (ref 0–44)
AST: 16 U/L (ref 15–41)
Albumin: 2.6 g/dL — ABNORMAL LOW (ref 3.5–5.0)
Alkaline Phosphatase: 24 U/L — ABNORMAL LOW (ref 38–126)
Anion gap: 8 (ref 5–15)
BUN: 18 mg/dL (ref 8–23)
CO2: 26 mmol/L (ref 22–32)
Calcium: 7.7 mg/dL — ABNORMAL LOW (ref 8.9–10.3)
Chloride: 109 mmol/L (ref 98–111)
Creatinine, Ser: 0.68 mg/dL (ref 0.44–1.00)
GFR, Estimated: >60 mL/min (ref >60)
Glucose, Bld: 84 mg/dL (ref 70–99)
Potassium: 3.7 mmol/L (ref 3.5–5.1)
Sodium: 143 mmol/L (ref 135–145)
Total Bilirubin: 0.7 mg/dL (ref 0.0–1.2)
Total Protein: 4.1 g/dL — ABNORMAL LOW (ref 6.5–8.1)

## 2023-04-02 LAB — HEMOGLOBIN
Hemoglobin: 7.4 g/dL — ABNORMAL LOW (ref 12.0–15.0)
Hemoglobin: 7.7 g/dL — ABNORMAL LOW (ref 12.0–15.0)
Hemoglobin: 8 g/dL — ABNORMAL LOW (ref 12.0–15.0)

## 2023-04-02 MED ORDER — PANTOPRAZOLE SODIUM 40 MG IV SOLR
40.0000 mg | INTRAVENOUS | Status: DC
  Administered 2023-04-03: 40 mg via INTRAVENOUS
  Filled 2023-04-02: qty 10

## 2023-04-02 MED ORDER — LATANOPROST 0.005 % OP SOLN
1.0000 [drp] | Freq: Every day | OPHTHALMIC | Status: DC
  Administered 2023-04-02: 1 [drp] via OPHTHALMIC
  Filled 2023-04-02 (×2): qty 2.5

## 2023-04-02 MED ORDER — MOMETASONE FURO-FORMOTEROL FUM 200-5 MCG/ACT IN AERO
2.0000 | INHALATION_SPRAY | Freq: Two times a day (BID) | RESPIRATORY_TRACT | Status: DC
  Administered 2023-04-02 – 2023-04-03 (×3): 2 via RESPIRATORY_TRACT
  Filled 2023-04-02: qty 8.8

## 2023-04-02 MED ORDER — TAMOXIFEN CITRATE 10 MG PO TABS
10.0000 mg | ORAL_TABLET | Freq: Every day | ORAL | Status: DC
  Administered 2023-04-02 – 2023-04-03 (×2): 10 mg via ORAL
  Filled 2023-04-02 (×3): qty 1

## 2023-04-02 MED ORDER — POLYETHYLENE GLYCOL 3350 17 G PO PACK
17.0000 g | PACK | Freq: Every day | ORAL | Status: DC
Start: 2023-04-02 — End: 2023-04-03
  Administered 2023-04-02 – 2023-04-03 (×2): 17 g via ORAL
  Filled 2023-04-02 (×2): qty 1

## 2023-04-02 MED ORDER — PSYLLIUM 95 % PO PACK
1.0000 | PACK | Freq: Every day | ORAL | Status: DC
Start: 2023-04-02 — End: 2023-04-03
  Administered 2023-04-02 – 2023-04-03 (×2): 1 via ORAL
  Filled 2023-04-02 (×2): qty 1

## 2023-04-02 MED ORDER — PANTOPRAZOLE SODIUM 40 MG IV SOLR
40.0000 mg | Freq: Two times a day (BID) | INTRAVENOUS | Status: DC
  Administered 2023-04-02: 40 mg via INTRAVENOUS
  Filled 2023-04-02: qty 10

## 2023-04-02 NOTE — Progress Notes (Signed)
PROGRESS NOTE    Hayley Nolan  ZOX:[id number] DOB: Oct 20, 1927 DOA: 04/01/2023 PCP: Lauro Regulus, MD  Outpatient Specialists: oncology    Brief Narrative:   Hayley Nolan is a 89 y.o. female with medical history significant of asthma, breast cancer, HTN presenting w/ diverticular bleeding. Pt resident of [facility] . Per report patient w/ large dark red bowel movement earlier today. Pt reported adominal fullness prior to event. Denies any chest pain, SOB, abd pain. No fevers or chills. No reported antiplatelet, NSAID or anticoagulant use. No focal hemiparesis or confusion. No reported falls or head trauma.   Lives in assisted living ([facility]). Ambulates with a walker at baseline.   Daughter says no history GI bleeding. Thinks has been many years since last endoluminal eval.    Assessment & Plan:   Principal Problem:   Rectal bleeding Active Problems:   Primary cancer of upper outer quadrant of right female breast (HCC)   Asthma   GERD (gastroesophageal reflux disease)   Hypertensive kidney disease with CKD stage III (HCC)   Dementia arising in the senium and presenium (HCC)   # Hematochezia Large volume prior to presentation on 1/30. No report of bleeding overnight. Baseline hgb seems to be around 13 though no recent check for comparison. Was 9.3 on presentation, 7.4 today. CTA no active bleed. Hemodynamically stable - continue to trend hgb - continue clears - start ppi, though relatively low concern for upper gi bleed - continue fluids - gi consulted (dr. Servando Snare)  # asthma - dulera for home advair  # Breast cancer Follows w/ dr. Orlie Dakin  # HTN Mild bp elevation - will hold home spiro in setting of bleeding  # Breast cancer - cont home tamoxifen   DVT prophylaxis: SCDs Code Status: full code Family Communication: daughter joann updated telephonically 1/31  Level of care: Med-Surg Status is: Inpatient Remains inpatient appropriate  because: severity of illness    Consultants:  GI  Procedures: pending  Antimicrobials:  none    Subjective: No pain, no vomiting, no stool or bleeding overnight  Objective: Vitals:   04/01/23 1611 04/01/23 1611 04/01/23 2234 04/02/23 0731  BP: (!) 153/62 (!) 153/62 (!) 148/67 (!) 150/60  Pulse: 91 92 88 77  Resp: 16 16 16 16   Temp: 98.6 F (37 C) 98.6 F (37 C) 98.7 F (37.1 C) 97.8 F (36.6 C)  TempSrc:    Oral  SpO2: 100% 99% 98% 97%  Weight:      Height:        Intake/Output Summary (Last 24 hours) at 04/02/2023 0749 Last data filed at 04/02/2023 0500 Gross per 24 hour  Intake 2548.23 ml  Output --  Net 2548.23 ml   Filed Weights   04/01/23 0048  Weight: 58.5 kg    Examination:  General exam: Appears calm and comfortable  Respiratory system: Clear to auscultation. Respiratory effort normal. Cardiovascular system: S1 & S2 heard, RRR. Soft systolic murmur Gastrointestinal system: Abdomen is nondistended, soft and nontender. No organomegaly or masses felt.   Central nervous system: Alert and oriented to self. No focal neurological deficits. Extremities: Symmetric 5 x 5 power. LE edema to knees Skin: No rashes, lesions or ulcers Psychiatry: confused, calm    Data Reviewed: I have personally reviewed following labs and imaging studies  CBC: Recent Labs  Lab 04/01/23 0115 04/01/23 1135 04/01/23 1555 04/01/23 2253 04/02/23 0602  WBC 12.1*  --   --   --   --  NEUTROABS 9.7*  --   --   --   --   HGB 9.3* 9.4* 8.5* 7.6* 7.4*  HCT 28.4*  --   --   --   --   MCV 91.0  --   --   --   --   PLT 198  --   --   --   --    Basic Metabolic Panel: Recent Labs  Lab 04/01/23 0115 04/02/23 0602  NA 140 143  K 4.2 3.7  CL 108 109  CO2 25 26  GLUCOSE 140* 84  BUN 37* 18  CREATININE 1.06* 0.68  CALCIUM 8.3* 7.7*   GFR: Estimated Creatinine Clearance: 39.5 mL/min (by C-G formula based on SCr of 0.68 mg/dL). Liver Function Tests: Recent Labs  Lab  04/01/23 0115 04/02/23 0602  AST 16 16  ALT 12 12  ALKPHOS 29* 24*  BILITOT 0.4 0.7  PROT 4.7* 4.1*  ALBUMIN 2.9* 2.6*   Recent Labs  Lab 04/01/23 0115  LIPASE 47   No results for input(s): "AMMONIA" in the last 168 hours. Coagulation Profile: Recent Labs  Lab 04/01/23 0115  INR 1.2   Cardiac Enzymes: No results for input(s): "CKTOTAL", "CKMB", "CKMBINDEX", "TROPONINI" in the last 168 hours. BNP (last 3 results) No results for input(s): "PROBNP" in the last 8760 hours. HbA1C: No results for input(s): "HGBA1C" in the last 72 hours. CBG: No results for input(s): "GLUCAP" in the last 168 hours. Lipid Profile: No results for input(s): "CHOL", "HDL", "LDLCALC", "TRIG", "CHOLHDL", "LDLDIRECT" in the last 72 hours. Thyroid Function Tests: No results for input(s): "TSH", "T4TOTAL", "FREET4", "T3FREE", "THYROIDAB" in the last 72 hours. Anemia Panel: No results for input(s): "VITAMINB12", "FOLATE", "FERRITIN", "TIBC", "IRON", "RETICCTPCT" in the last 72 hours. Urine analysis:    Component Value Date/Time   COLORURINE YELLOW (A) 07/17/2022 1311   APPEARANCEUR HAZY (A) 07/17/2022 1311   LABSPEC 1.012 07/17/2022 1311   PHURINE 5.0 07/17/2022 1311   GLUCOSEU NEGATIVE 07/17/2022 1311   HGBUR NEGATIVE 07/17/2022 1311   BILIRUBINUR NEGATIVE 07/17/2022 1311   KETONESUR NEGATIVE 07/17/2022 1311   PROTEINUR NEGATIVE 07/17/2022 1311   NITRITE NEGATIVE 07/17/2022 1311   LEUKOCYTESUR TRACE (A) 07/17/2022 1311   Sepsis Labs: @LABRCNTIP (procalcitonin:4,lacticidven:4)  )No results found for this or any previous visit (from the past 240 hours).       Radiology Studies: CT ANGIO GI BLEED Result Date: 04/01/2023 CLINICAL DATA:  Left-sided abdominal pain and dark red stools, initial encounter EXAM: CTA ABDOMEN AND PELVIS WITHOUT AND WITH CONTRAST TECHNIQUE: Multidetector CT imaging of the abdomen and pelvis was performed using the standard protocol during bolus administration of  intravenous contrast. Multiplanar reconstructed images and MIPs were obtained and reviewed to evaluate the vascular anatomy. RADIATION DOSE REDUCTION: This exam was performed according to the departmental dose-optimization program which includes automated exposure control, adjustment of the mA and/or kV according to patient size and/or use of iterative reconstruction technique. CONTRAST:  OMNIPAQUE IOHEXOL 350 MG/ML SOLN COMPARISON:  None Available. FINDINGS: VASCULAR Aorta: Abdominal aorta demonstrates atherosclerotic calcification without aneurysmal dilatation or dissection. Celiac: Patent without evidence of aneurysm, dissection, vasculitis or significant stenosis. SMA: Patent without evidence of aneurysm, dissection, vasculitis or significant stenosis. Renals: Both renal arteries are patent without evidence of aneurysm, dissection, vasculitis, fibromuscular dysplasia or significant stenosis. IMA: Patent without evidence of aneurysm, dissection, vasculitis or significant stenosis. Inflow: Iliacs demonstrate atherosclerotic calcification without focal stenosis. Veins: No specific venous abnormality is noted. Review of the MIP images  confirms the above findings. NON-VASCULAR Lower chest: No acute abnormality.  Left breast implant is noted. Hepatobiliary: Multiple hepatic cysts are identified. The largest of these demonstrates peripheral calcification. No significant interval changes noted. The gallbladder is decompressed with multiple stones within. Pancreas: Scattered pancreatic cysts are noted. The largest of these lies in the tail measuring 20 mm. Given the patient's age no further follow-up is recommended. Spleen: Normal in size without focal abnormality. Adrenals/Urinary Tract: Multiple renal cysts are noted. No follow-up is recommended. No renal calculi or obstructive changes are seen. The bladder is well distended. Stomach/Bowel: Scattered diverticular change of the colon is noted without evidence of  diverticulitis. The appendix is well visualized and within normal limits. No pooling of contrast to suggest active GI hemorrhage is seen. Stomach and small bowel appear within normal limits. Lymphatic: No lymphadenopathy is noted. Reproductive: Status post hysterectomy. No adnexal masses. Other: No abdominal wall hernia or abnormality. No abdominopelvic ascites. Musculoskeletal: No acute or significant osseous findings. IMPRESSION: VASCULAR No findings to suggest active GI hemorrhage. Scattered atherosclerotic changes are seen. NON-VASCULAR Diverticulosis without diverticulitis. Hepatic, pancreatic and renal cysts. Given the patient's age and appearance no further follow-up is recommended. Electronically Signed   By: Alcide Clever M.D.   On: 04/01/2023 03:29        Scheduled Meds: Continuous Infusions:  sodium chloride 100 mL/hr at 04/01/23 1300     LOS: 1 day     Silvano Bilis, MD [facility]   If 7PM-7AM, please contact night-coverage [email or web address] Password [facility] 04/02/2023, 7:49 AM
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
## 2023-04-02 NOTE — Consult Note (Signed)
Consultation  Referring Provider:  Hospitalist    Admit date: 04/01/2023 Consult date: 04/02/2023         Reason for Consultation:     Hematochezia         HPI:   Hayley Nolan is a 89 y.o. lady with history of breast cancer and hypertension who was admitted for large volume hematochezia that happened on the 29th. Has not had any recurrent episodes since then. Hemoglobin has drifted lower. Patient with dementia. Knows her name but not place or time. Daughter provides the history. Her last colonoscopy was many years ago and according to Daughter, it was negative. She did have an episode of diverticulitis many years ago. CTA was done in the ED which was negative. No significant abdominal surgeries.  Past Medical History:  Diagnosis Date   Asthma    Basal cell carcinoma 06/29/2016   L lat chin nodular pattern   Breast cancer (HCC) 1999   right breast   Breast cancer (HCC) 12/2018   left breast   Cancer (HCC) 1999   Left breast pre cancerous- mastectomy with reconstruction   Hypertension    Personal history of chemotherapy     Past Surgical History:  Procedure Laterality Date   AUGMENTATION MAMMAPLASTY Left 1999   BREAST BIOPSY Right 11/30/2018   affirm stereo bx of calcs, x marker, positive   BREAST BIOPSY Right 11/30/2018   affirm bx of calcs, ribbon marker,positive   BREAST LUMPECTOMY Left 12/16/2018   BREAST LUMPECTOMY WITH NEEDLE LOCALIZATION AND AXILLARY SENTINEL LYMPH NODE BX Right 12/16/2018   Procedure: BREAST LUMPECTOMY WITH NEEDLE LOCALIZATION AND AXILLARY SENTINEL LYMPH NODE BX;  Surgeon: Earline Mayotte, MD;  Location: [facility] ORS;  Service: General;  Laterality: Right;   KIDNEY CYST REMOVAL     MASTECTOMY Left 1999   Implant   REDUCTION MAMMAPLASTY Right 1997    Family History  Problem Relation Age of Onset   Breast cancer Neg Hx     Social History   Tobacco Use   Smoking status: Never   Smokeless tobacco: Never  Vaping Use   Vaping status: Never Used   Substance Use Topics   Alcohol use: Never   Drug use: Never    Prior to Admission medications   Medication Sig Start Date End Date Taking? Authorizing Provider  ADVAIR DISKUS 250-50 MCG/DOSE AEPB Inhale 1 puff into the lungs every 12 (twelve) hours. 03/03/18  Yes [provider]  alendronate (FOSAMAX) 70 MG tablet TAKE 1 TABLET EVERY 7 DAYS WITH A FULL GLASS OF WATER ON AN EMPTY STOMACH DO NOT LIE DOWN FOR AT LEAST 30 MIN 04/24/21  Yes Jeralyn Ruths, MD  Biotin 5 MG CAPS Take 5 mg by mouth daily.   Yes [provider]  Cholecalciferol 25 MCG (1000 UT) tablet Take 1,000 Units by mouth daily.   Yes [provider]  latanoprost (XALATAN) 0.005 % ophthalmic solution Place 1 drop into both eyes at bedtime.   Yes [provider]  Multiple Vitamins-Minerals (MULTIVITAL PLATINUM SILVER PO) Take 1 tablet by mouth daily.   Yes [provider]  Multiple Vitamins-Minerals (PRESERVISION AREDS) CAPS Take 1 capsule by mouth 2 (two) times daily.   Yes [provider]  potassium chloride (K-DUR,KLOR-CON) 10 MEQ tablet Take 10 mEq by mouth 2 (two) times daily. 04/30/18  Yes [provider]  PROAIR HFA 108 (90 Base) MCG/ACT inhaler Inhale 2 puffs into the lungs every 4 (four) hours as needed for wheezing.  04/02/18  Yes [provider]  spironolactone (ALDACTONE) 25 MG tablet Take 25 mg by mouth daily. 07/09/20  Yes [provider]  tamoxifen (NOLVADEX) 10 MG tablet TAKE ONE (1) TABLET BY MOUTH TWO TIMES PER DAY 06/30/22  Yes Jeralyn Ruths, MD  metroNIDAZOLE (METROCREAM) 0.75 % cream Apply topically to redness at cheeks and nose 1 to 2 times daily for rosacea Patient not taking: Reported on 04/01/2023 12/03/22   Deirdre Evener, MD  mupirocin ointment (BACTROBAN) 2 % Apply 1 Application topically 3 (three) times daily. Patient not taking: Reported on 04/01/2023 12/15/21   Deirdre Evener, MD  trastuzumab-dkst 2 mg/kg in sodium  chloride 0.9 % 250 mL Inject 2 mg/kg into the vein once. Patient not taking: Reported on 04/01/2023    [provider]    Current Facility-Administered Medications  Medication Dose Route Frequency Provider Last Rate Last Admin   albuterol (PROVENTIL) (2.5 MG/3ML) 0.083% nebulizer solution 2.5 mg  2.5 mg Nebulization Q2H PRN Floydene Flock, MD   2.5 mg at 04/01/23 1132   latanoprost (XALATAN) 0.005 % ophthalmic solution 1 drop  1 drop Both Eyes QHS Wouk, Wilfred Curtis, MD       mometasone-formoterol [facility]) 200-5 MCG/ACT inhaler 2 puff  2 puff Inhalation BID Kathrynn Running, MD   2 puff at 04/02/23 1201   ondansetron (ZOFRAN) tablet 4 mg  4 mg Oral Q6H PRN Floydene Flock, MD       Or   ondansetron [facility]) injection 4 mg  4 mg Intravenous Q6H PRN Floydene Flock, MD       pantoprazole (PROTONIX) injection 40 mg  40 mg Intravenous Q12H Wouk, Wilfred Curtis, MD   40 mg at 04/02/23 1200   tamoxifen (NOLVADEX) tablet 10 mg  10 mg Oral Daily Kathrynn Running, MD   10 mg at 04/02/23 1200    Allergies as of 04/01/2023 - Review Complete 04/01/2023  Allergen Reaction Noted   Cephalexin Other (See Comments) 05/10/2018     Review of Systems:    All systems reviewed and negative except where noted in HPI.  Unable to assess due to mental status    Physical Exam:  Vital signs in last 24 hours: Temp:  [97.8 F (36.6 C)-98.7 F (37.1 C)] 97.8 F (36.6 C) (01/31 0731) Pulse Rate:  [77-92] 77 (01/31 0731) Resp:  [16-18] 16 (01/31 0731) BP: (133-153)/(58-67) 150/60 (01/31 0731) SpO2:  [97 %-100 %] 97 % (01/31 0731) Last BM Date : 04/02/23 General:   Pleasant in NAD Head:  Normocephalic and atraumatic. Eyes:   No icterus.   Conjunctiva pink. Mouth: Mucosa pink moist, no lesions. Neck:  Supple; no masses felt Lungs: No respiratory distress Abdomen:   Flat, soft, nondistended, nontender Msk:  No clubbing or cyanosis. Symmetrical without gross deformities. Neurologic:  No focal  deficits Skin:  Warm, dry, pink without significant lesions or rashes. Psych:  Alert and cooperative. Normal affect.  LAB RESULTS: Recent Labs    04/01/23 0115 04/01/23 1135 04/01/23 2253 04/02/23 0602 04/02/23 1045  WBC 12.1*  --   --   --   --   HGB 9.3*   < > 7.6* 7.4* 7.7*  HCT 28.4*  --   --   --   --   PLT 198  --   --   --   --    < > = values in this interval not displayed.   BMET Recent Labs  04/01/23 0115 04/02/23 0602  NA 140 143  K 4.2 3.7  CL 108 109  CO2 25 26  GLUCOSE 140* 84  BUN 37* 18  CREATININE 1.06* 0.68  CALCIUM 8.3* 7.7*   LFT Recent Labs    04/02/23 0602  PROT 4.1*  ALBUMIN 2.6*  AST 16  ALT 12  ALKPHOS 24*  BILITOT 0.7   PT/INR Recent Labs    04/01/23 0115  LABPROT 15.1  INR 1.2    STUDIES: CT ANGIO GI BLEED Result Date: 04/01/2023 CLINICAL DATA:  Left-sided abdominal pain and dark red stools, initial encounter EXAM: CTA ABDOMEN AND PELVIS WITHOUT AND WITH CONTRAST TECHNIQUE: Multidetector CT imaging of the abdomen and pelvis was performed using the standard protocol during bolus administration of intravenous contrast. Multiplanar reconstructed images and MIPs were obtained and reviewed to evaluate the vascular anatomy. RADIATION DOSE REDUCTION: This exam was performed according to the departmental dose-optimization program which includes automated exposure control, adjustment of the mA and/or kV according to patient size and/or use of iterative reconstruction technique. CONTRAST:  OMNIPAQUE IOHEXOL 350 MG/ML SOLN COMPARISON:  None Available. FINDINGS: VASCULAR Aorta: Abdominal aorta demonstrates atherosclerotic calcification without aneurysmal dilatation or dissection. Celiac: Patent without evidence of aneurysm, dissection, vasculitis or significant stenosis. SMA: Patent without evidence of aneurysm, dissection, vasculitis or significant stenosis. Renals: Both renal arteries are patent without evidence of aneurysm, dissection,  vasculitis, fibromuscular dysplasia or significant stenosis. IMA: Patent without evidence of aneurysm, dissection, vasculitis or significant stenosis. Inflow: Iliacs demonstrate atherosclerotic calcification without focal stenosis. Veins: No specific venous abnormality is noted. Review of the MIP images confirms the above findings. NON-VASCULAR Lower chest: No acute abnormality.  Left breast implant is noted. Hepatobiliary: Multiple hepatic cysts are identified. The largest of these demonstrates peripheral calcification. No significant interval changes noted. The gallbladder is decompressed with multiple stones within. Pancreas: Scattered pancreatic cysts are noted. The largest of these lies in the tail measuring 20 mm. Given the patient's age no further follow-up is recommended. Spleen: Normal in size without focal abnormality. Adrenals/Urinary Tract: Multiple renal cysts are noted. No follow-up is recommended. No renal calculi or obstructive changes are seen. The bladder is well distended. Stomach/Bowel: Scattered diverticular change of the colon is noted without evidence of diverticulitis. The appendix is well visualized and within normal limits. No pooling of contrast to suggest active GI hemorrhage is seen. Stomach and small bowel appear within normal limits. Lymphatic: No lymphadenopathy is noted. Reproductive: Status post hysterectomy. No adnexal masses. Other: No abdominal wall hernia or abnormality. No abdominopelvic ascites. Musculoskeletal: No acute or significant osseous findings. IMPRESSION: VASCULAR No findings to suggest active GI hemorrhage. Scattered atherosclerotic changes are seen. NON-VASCULAR Diverticulosis without diverticulitis. Hepatic, pancreatic and renal cysts. Given the patient's age and appearance no further follow-up is recommended. Electronically Signed   By: Alcide Clever M.D.   On: 04/01/2023 03:29       Impression / Plan:   89 y/o lady with history of dementia, breast cancer,  and hypertension here with likely diverticular bleed that has resolved. Given no further bleeding, age, and mental status would recommend monitoring hemoglobin. Daughter is in agreement with this plan.  - check daily cbc - consider IV iron - transfuse if hemoglobin < 7 - maintain active type and screen - monitor for recurrent GI bleeding - ok to resume solid diet - will add laxatives - avoid NSAIDS - ok to switch to daily PPI - if recurrent hemodynamically significant hematochezia, would recommend stat  CTA  Will continue to follow, please call with any questions or concerns.  Merlyn Lot MD, MPH [facility] GI

## 2023-04-03 DIAGNOSIS — K625 Hemorrhage of anus and rectum: Secondary | ICD-10-CM | POA: Diagnosis not present

## 2023-04-03 LAB — PROTIME-INR
INR: 1.2 (ref 0.8–1.2)
Prothrombin Time: 14.9 s (ref 11.4–15.2)

## 2023-04-03 LAB — CBC
HCT: 22.4 % — ABNORMAL LOW (ref 36.0–46.0)
Hemoglobin: 7.4 g/dL — ABNORMAL LOW (ref 12.0–15.0)
MCH: 29.8 pg (ref 26.0–34.0)
MCHC: 33 g/dL (ref 30.0–36.0)
MCV: 90.3 fL (ref 80.0–100.0)
Platelets: 175 10*3/uL (ref 150–400)
RBC: 2.48 MIL/uL — ABNORMAL LOW (ref 3.87–5.11)
RDW: 14.7 % (ref 11.5–15.5)
WBC: 6.5 10*3/uL (ref 4.0–10.5)
nRBC: 0 % (ref 0.0–0.2)

## 2023-04-03 LAB — BASIC METABOLIC PANEL
Anion gap: 7 (ref 5–15)
BUN: 17 mg/dL (ref 8–23)
CO2: 27 mmol/L (ref 22–32)
Calcium: 8.2 mg/dL — ABNORMAL LOW (ref 8.9–10.3)
Chloride: 109 mmol/L (ref 98–111)
Creatinine, Ser: 0.67 mg/dL (ref 0.44–1.00)
GFR, Estimated: >60 mL/min (ref >60)
Glucose, Bld: 91 mg/dL (ref 70–99)
Potassium: 3.4 mmol/L — ABNORMAL LOW (ref 3.5–5.1)
Sodium: 143 mmol/L (ref 135–145)

## 2023-04-03 MED ORDER — POLYETHYLENE GLYCOL 3350 17 G PO PACK: 17.0000 g | PACK | Freq: Every day | ORAL | 0 refills | Status: AC

## 2023-04-03 MED ORDER — POLYSACCHARIDE IRON COMPLEX 150 MG PO CAPS
150.0000 mg | ORAL_CAPSULE | Freq: Every day | ORAL | Status: DC
  Filled 2023-04-03: qty 1

## 2023-04-03 MED ORDER — POLYSACCHARIDE IRON COMPLEX 150 MG PO CAPS: 150.0000 mg | ORAL_CAPSULE | Freq: Every day | ORAL | 1 refills | Status: AC

## 2023-04-03 MED ORDER — PANTOPRAZOLE SODIUM 40 MG PO TBEC: 40.0000 mg | DELAYED_RELEASE_TABLET | Freq: Every day | ORAL | 1 refills | Status: AC

## 2023-04-03 NOTE — Plan of Care (Signed)

## 2023-04-03 NOTE — TOC Transition Note (Signed)
Transition of Care [facility]) - Discharge Note   Patient Details  Name: Hayley Nolan MRN: [id number] Date of Birth: 08-14-1927  [facility] CM/SW Contact:  Hetty Ely, RN Phone Number: 04/03/2023, 11:56 AM   Final next level of care: Other (comment) (Independent Living Facility, [facility].) Barriers to Discharge: Barriers Resolved   Patient Goals and CMS Choice Patient states their goals for this hospitalization and ongoing recovery are:: Per Daughter, Jerolyn Center to return to [facility]          Discharge Placement                Patient to be transferred to facility by: Daughter, Jerolyn Center Name of family member notified: Jerolyn Center Patient and family notified of of transfer: 04/03/23  Discharge Plan and Services Additional resources added to the After Visit Summary for                  DME Arranged: N/A DME Agency: NA       HH Arranged: NA HH Agency: NA        Social Drivers of Health (SDOH) Interventions SDOH Screenings   Food Insecurity: No Food Insecurity (04/01/2023)  Housing: Low Risk  (04/01/2023)  Transportation Needs: No Transportation Needs (04/01/2023)  Utilities: Not At Risk (04/01/2023)  Financial Resource Strain: Low Risk  (03/23/2023)   Received from [facility]  Social Connections: Moderately Isolated (04/01/2023)  Tobacco Use: Low Risk  (04/01/2023)     Readmission Risk Interventions     No data to display

## 2023-04-03 NOTE — Discharge Summary (Signed)
Physician Discharge Summary   Patient: Hayley Nolan MRN: [id number] DOB: [birth year]  Admit date:     04/01/2023  Discharge date: 04/03/23  Discharge Physician: Enedina Finner   PCP: Lauro Regulus, MD   Recommendations at discharge:   follow-up PCP in 1 to 2 weeks follow-up G.I. Dr. Mia Creek in one week and get CBC checked.  Discharge Diagnoses: Principal Problem:   Rectal bleeding Active Problems:   Primary cancer of upper outer quadrant of right female breast (HCC)   Asthma   GERD (gastroesophageal reflux disease)   Hypertensive kidney disease with CKD stage III (HCC)   Dementia arising in the senium and presenium (HCC)  Hayley Nolan is a 89 y.o. female with medical history significant of asthma, breast cancer, HTN presenting w/ diverticular bleeding. Pt resident of [facility]. Per report patient w/ large dark red bowel movement earlier today. Pt reported adominal fullness prior to event. Denies any chest pain, SOB, abd pain. No fevers or chills. No reported antiplatelet, NSAID or anticoagulant use. No focal hemiparesis or confusion. No reported falls or head trauma.   [facility]. Ambulates by herself with a walker daughter checks on her.  Hematochezia -- etiology unclear. Could be diverticular. -- CTA no active bleed -- hemoglobin was 9.3 now remains stable at 7.4 -- G.I. consult with Dr. Schuyler Amor-- no more G.I. bleed. Patient eager to go home -- will start on PO iron pills -- patient will follow-up with G.I. as outpatient  Asthma -- continue home inhalers  History of breast cancer -- follow-up with Dr. Orlie Dakin -- continue tamoxifen  Overall hemodynamically stable. Discharge plan discussed with patient's daughter at bedside. She is in agreement. G.I. Dr. Mia Creek is in agreement      Consultants: G.I. Procedures performed: none Disposition: Home Diet recommendation:  Cardiac diet DISCHARGE  MEDICATION: Allergies as of 04/03/2023       Reactions   Cephalexin Other (See Comments)   Possible headache         Medication List     STOP taking these medications    metroNIDAZOLE 0.75 % cream Commonly known as: METROCREAM   mupirocin ointment 2 % Commonly known as: BACTROBAN   trastuzumab-dkst 2 mg/kg in sodium chloride 0.9 % 250 mL       TAKE these medications    Advair Diskus 250-50 MCG/DOSE Aepb Generic drug: fluticasone-salmeterol Inhale 1 puff into the lungs every 12 (twelve) hours.   alendronate 70 MG tablet Commonly known as: FOSAMAX TAKE 1 TABLET EVERY 7 DAYS WITH A FULL GLASS OF WATER ON AN EMPTY STOMACH DO NOT LIE DOWN FOR AT LEAST 30 MIN   Biotin 5 MG Caps Take 5 mg by mouth daily.   Cholecalciferol 25 MCG (1000 UT) tablet Take 1,000 Units by mouth daily.   iron polysaccharides 150 MG capsule Commonly known as: NIFEREX Take 1 capsule (150 mg total) by mouth daily.   latanoprost 0.005 % ophthalmic solution Commonly known as: XALATAN Place 1 drop into both eyes at bedtime.   MULTIVITAL PLATINUM SILVER PO Take 1 tablet by mouth daily.   PreserVision AREDS Caps Take 1 capsule by mouth 2 (two) times daily.   pantoprazole 40 MG tablet Commonly known as: Protonix Take 1 tablet (40 mg total) by mouth daily.   polyethylene glycol 17 g packet Commonly known as: MIRALAX / GLYCOLAX Take 17 g by mouth daily. Start taking on: April 04, 2023   potassium chloride 10 MEQ  tablet Commonly known as: KLOR-CON M Take 10 mEq by mouth 2 (two) times daily.   ProAir HFA 108 (90 Base) MCG/ACT inhaler Generic drug: albuterol Inhale 2 puffs into the lungs every 4 (four) hours as needed for wheezing.   spironolactone 25 MG tablet Commonly known as: ALDACTONE Take 25 mg by mouth daily.   tamoxifen 10 MG tablet Commonly known as: NOLVADEX TAKE ONE (1) TABLET BY MOUTH TWO TIMES PER DAY        Follow-up Information     Lauro Regulus, MD.  Schedule an appointment as soon as possible for a visit in 1 week(s).   Specialty: Internal Medicine Contact information: [address] [facility] [place] [address] [phone]         Regis Bill, MD. Schedule an appointment as soon as possible for a visit in 1 week(s).   Specialty: Gastroenterology Why: Rectal bleed--chack blood work Contact information: [address] [place] [address] [phone]                Discharge Exam: Ceasar Mons Weights   04/01/23 0048  Weight: 58.5 kg   Alert and oriented times three respiratory clear to auscultation cardiovascular both heart sounds normal abdomen soft benign  Condition at discharge: fair  The results of significant diagnostics from this hospitalization (including imaging, microbiology, ancillary and laboratory) are listed below for reference.   Imaging Studies: CT ANGIO GI BLEED Result Date: 04/01/2023 CLINICAL DATA:  Left-sided abdominal pain and dark red stools, initial encounter EXAM: CTA ABDOMEN AND PELVIS WITHOUT AND WITH CONTRAST TECHNIQUE: Multidetector CT imaging of the abdomen and pelvis was performed using the standard protocol during bolus administration of intravenous contrast. Multiplanar reconstructed images and MIPs were obtained and reviewed to evaluate the vascular anatomy. RADIATION DOSE REDUCTION: This exam was performed according to the departmental dose-optimization program which includes automated exposure control, adjustment of the mA and/or kV according to patient size and/or use of iterative reconstruction technique. CONTRAST:  OMNIPAQUE IOHEXOL 350 MG/ML SOLN COMPARISON:  None Available. FINDINGS: VASCULAR Aorta: Abdominal aorta demonstrates atherosclerotic calcification without aneurysmal dilatation or dissection. Celiac: Patent without evidence of aneurysm, dissection, vasculitis or significant stenosis. SMA: Patent without evidence of aneurysm,  dissection, vasculitis or significant stenosis. Renals: Both renal arteries are patent without evidence of aneurysm, dissection, vasculitis, fibromuscular dysplasia or significant stenosis. IMA: Patent without evidence of aneurysm, dissection, vasculitis or significant stenosis. Inflow: Iliacs demonstrate atherosclerotic calcification without focal stenosis. Veins: No specific venous abnormality is noted. Review of the MIP images confirms the above findings. NON-VASCULAR Lower chest: No acute abnormality.  Left breast implant is noted. Hepatobiliary: Multiple hepatic cysts are identified. The largest of these demonstrates peripheral calcification. No significant interval changes noted. The gallbladder is decompressed with multiple stones within. Pancreas: Scattered pancreatic cysts are noted. The largest of these lies in the tail measuring 20 mm. Given the patient's age no further follow-up is recommended. Spleen: Normal in size without focal abnormality. Adrenals/Urinary Tract: Multiple renal cysts are noted. No follow-up is recommended. No renal calculi or obstructive changes are seen. The bladder is well distended. Stomach/Bowel: Scattered diverticular change of the colon is noted without evidence of diverticulitis. The appendix is well visualized and within normal limits. No pooling of contrast to suggest active GI hemorrhage is seen. Stomach and small bowel appear within normal limits. Lymphatic: No lymphadenopathy is noted. Reproductive: Status post hysterectomy. No adnexal masses. Other: No abdominal wall hernia or abnormality. No  abdominopelvic ascites. Musculoskeletal: No acute or significant osseous findings. IMPRESSION: VASCULAR No findings to suggest active GI hemorrhage. Scattered atherosclerotic changes are seen. NON-VASCULAR Diverticulosis without diverticulitis. Hepatic, pancreatic and renal cysts. Given the patient's age and appearance no further follow-up is recommended. Electronically Signed   By:  Alcide Clever M.D.   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 Patient opted to not undergo re-excision due to her age and goals of care. She completed adjuvant radiation therapy and declined adjuvant chemotherapy. She is currently taking tamoxifen. She is status post a remote left mastectomy in 1999. EXAM: DIGITAL DIAGNOSTIC UNILATERAL RIGHT MAMMOGRAM WITH TOMOSYNTHESIS AND CAD TECHNIQUE: Right digital diagnostic mammography and breast tomosynthesis was performed. The images were evaluated with computer-aided detection. COMPARISON:  Previous exam(s). ACR Breast Density Category c: The breasts are heterogeneously dense, which may obscure small masses. FINDINGS: Stable postlumpectomy changes in the outer and central right breast. There is also unchanged diffuse right breast skin thickening, consistent with postradiation changes. Unchanged chronic right nipple inversion. No new suspicious mass, calcification, or other findings are identified in the right breast. IMPRESSION: Stable posttreatment changes in the right breast. No mammographic evidence of malignancy. RECOMMENDATION: Screening mammogram in one year. The patient is now 2 or more years post lumpectomy and therefore she may return to annual screening mammography, however the patient does remain eligible for annual diagnostic mammography if preferred given history of breast cancer. I have discussed the findings and recommendations with the patient. If applicable, a reminder letter will be sent to the patient regarding the next appointment. BI-RADS CATEGORY  2: Benign. Electronically Signed   By: Jacob Moores M.D.   On: 03/29/2023 11:30   DG Bone Density Result Date: 03/29/2023 EXAM: DUAL X-RAY ABSORPTIOMETRY (DXA) FOR BONE MINERAL DENSITY  IMPRESSION: Your patient Jakiyah Stepney completed a BMD test on 03/29/2023 using the [employer] iDXA DXA System (software version: 14.10) manufactured by [employer]. The following summarizes the results of our evaluation. Technologist: [facility] PATIENT BIOGRAPHICAL: Name: Desmond, Tufano Patient ID: [id number] Birth Date: 06-27-1927 Height: 65.0 in. Gender: Female Exam Date: 03/29/2023 Weight: 139.9 lbs. Indications: Advanced Age, Asthma, Caucasian, Height Loss, High Risk Meds, History of Breast Cancer, History of Fracture (Adult), Hysterectomy, Oophorectomy Bilateral, Postmenopausal, Previous Chemo and Radiation, Surgical Induced Menopause Fractures: Left ankle Treatments: Advair Inhaler, Calcium, Multi-Vitamin, ProAir inhaler, Tamoxifen, Vitamin D DENSITOMETRY RESULTS: Site      Region     Measured Date Measured Age WHO Classification Young Adult T-score BMD         %Change vs. Previous Significant Change (*) AP Spine L1-L2 03/29/2023 93.5 Normal 0.4 1.222 g/cm2 0.8% - AP Spine L1-L2 03/26/2022 92.5 Normal 0.3 1.212 g/cm2 3.5% - AP Spine L1-L2 03/25/2021 91.5 Normal 0.0 1.171 g/cm2 2.3% - AP Spine L1-L2 03/21/2020 90.5 Normal -0.2 1.145 g/cm2 1.1% - AP Spine L1-L2 03/21/2019 89.5 Normal -0.3 1.133 g/cm2 - - DualFemur Neck Right 03/29/2023 93.5 Osteopenia -1.4 0.837 g/cm2 1.0% - DualFemur Neck Right 03/26/2022 92.5 Osteopenia -1.5 0.829 g/cm2 2.6% - DualFemur Neck Right 03/25/2021 91.5 Osteopenia -1.7 0.808 g/cm2 -2.3% - DualFemur Neck Right 03/21/2020 90.5 Osteopenia -1.5 0.827 g/cm2 -6.4% Yes DualFemur Neck Right 03/21/2019 89.5 Osteopenia -1.1 0.884 g/cm2 - - DualFemur Total Mean 03/29/2023 93.5 Normal -0.7 0.917 g/cm2  0.3% - DualFemur Total Mean 03/26/2022 92.5 Normal -0.7 0.914 g/cm2 3.7% Yes DualFemur Total Mean 03/25/2021 91.5 Normal -1.0 0.881 g/cm2 0.2% - DualFemur Total Mean 03/21/2020 90.5 Normal -1.0 0.879 g/cm2 -1.6% - DualFemur Total Mean 03/21/2019 89.5 Normal -0.9 0.893 g/cm2 - - ASSESSMENT:  The BMD measured at Femur Neck Right is 0.837 g/cm2 with a T-score of -1.4. This patient is considered osteopenic according to World Health Organization [facility]) criteria. The scan quality is good. L-3 & 4 was excluded due to degenerative changes. Compared with prior study, there has been no significant change in the spine. Compared with prior study, there has been no significant change in the total hip. Patient is not a candidate for FRAX due to exceeding age range. World Science writer [facility]) criteria for post-menopausal, Caucasian Women: Normal:                   T-score at or above -1 SD Osteopenia/low bone mass: T-score between -1 and -2.5 SD Osteoporosis:             T-score at or below -2.5 SD RECOMMENDATIONS: 1. All patients should optimize calcium and vitamin D intake. 2. Consider FDA-approved medical therapies in postmenopausal women and men aged 30 years and older, based on the following: a. A hip or vertebral(clinical or morphometric) fracture b. T-score < -2.5 at the femoral neck or spine after appropriate evaluation to exclude secondary causes c. Low bone mass (T-score between -1.0 and -2.5 at the femoral neck or spine) and a 10-year probability of a hip fracture > 3% or a 10-year probability of a major osteoporosis-related fracture > 20% based on the [place]-adapted WHO algorithm 3. Clinician judgment and/or patient preferences may indicate treatment for people with 10-year fracture probabilities above or below these levels FOLLOW-UP: People with diagnosed cases of osteoporosis or at high risk for fracture should have regular bone mineral density tests. For patients eligible for Medicare, routine testing is allowed once every 2 years. The testing frequency can be increased to one year for patients who have rapidly progressing disease, those who are receiving or discontinuing medical therapy to restore bone mass, or have additional risk factors. I have reviewed this report, and agree with the above  findings. [facility] Electronically Signed   By: Baird Lyons M.D.   On: 03/29/2023 10:57    Microbiology: Results for orders placed or performed during the hospital encounter of 12/13/18  SARS CORONAVIRUS 2 (TAT 6-24 HRS) Nasopharyngeal Nasopharyngeal Swab     Status: None   Collection Time: 12/13/18 12:51 PM   Specimen: Nasopharyngeal Swab  Result Value Ref Range Status   SARS Coronavirus 2 NEGATIVE NEGATIVE Final    Comment: (NOTE) SARS-CoV-2 target nucleic acids are NOT DETECTED. The SARS-CoV-2 RNA is generally detectable in upper and lower respiratory specimens during the acute phase of infection. Negative results do not preclude SARS-CoV-2 infection, do not rule out co-infections with other pathogens, and should not be used as the sole basis for treatment or other patient management decisions. Negative results must be combined with clinical observations, patient history, and epidemiological information. The expected result is Negative. Fact Sheet for Patients: HairSlick.no Fact Sheet for Healthcare Providers: quierodirigir.com This test is not yet approved or cleared by the [place] FDA and  has been authorized for detection and/or diagnosis of SARS-CoV-2 by FDA under an Emergency Use Authorization (EUA). This EUA will remain  in effect (meaning this test can be used) for the duration of the  COVID-19 declaration under Section 56 4(b)(1) of the Act, 21 U.S.C. section 360bbb-3(b)(1), unless the authorization is terminated or revoked sooner. Performed at [facility], [address] [place], [place] [address]     Labs: CBC: Recent Labs  Lab 04/01/23 0115 04/01/23 1135 04/01/23 2253 04/02/23 0602 04/02/23 1045 04/02/23 1851 04/03/23 0454  WBC 12.1*  --   --   --   --   --  6.5  NEUTROABS 9.7*  --   --   --   --   --   --   HGB 9.3*   < > 7.6* 7.4* 7.7* 8.0* 7.4*  HCT 28.4*  --   --   --    --   --  22.4*  MCV 91.0  --   --   --   --   --  90.3  PLT 198  --   --   --   --   --  175   < > = values in this interval not displayed.   Basic Metabolic Panel: Recent Labs  Lab 04/01/23 0115 04/02/23 0602 04/03/23 0454  NA 140 143 143  K 4.2 3.7 3.4*  CL 108 109 109  CO2 25 26 27   GLUCOSE 140* 84 91  BUN 37* 18 17  CREATININE 1.06* 0.68 0.67  CALCIUM 8.3* 7.7* 8.2*   Liver Function Tests: Recent Labs  Lab 04/01/23 0115 04/02/23 0602  AST 16 16  ALT 12 12  ALKPHOS 29* 24*  BILITOT 0.4 0.7  PROT 4.7* 4.1*  ALBUMIN 2.9* 2.6*    Discharge time spent: greater than 30 minutes.  Signed: Enedina Finner, MD [facility] 04/03/2023

## 2023-04-05 ENCOUNTER — Ambulatory Visit: Payer: PPO

## 2023-04-05 ENCOUNTER — Ambulatory Visit: Payer: PPO | Admitting: Oncology

## 2023-04-05 ENCOUNTER — Other Ambulatory Visit: Payer: PPO

## 2023-04-07 DIAGNOSIS — L989 Disorder of the skin and subcutaneous tissue, unspecified: Secondary | ICD-10-CM | POA: Diagnosis not present

## 2023-04-07 DIAGNOSIS — D649 Anemia, unspecified: Secondary | ICD-10-CM | POA: Diagnosis not present

## 2023-04-07 DIAGNOSIS — R350 Frequency of micturition: Secondary | ICD-10-CM | POA: Diagnosis not present

## 2023-04-08 ENCOUNTER — Inpatient Hospital Stay: Payer: PPO | Admitting: Oncology

## 2023-04-08 ENCOUNTER — Inpatient Hospital Stay: Payer: PPO

## 2023-04-08 ENCOUNTER — Encounter: Payer: Self-pay | Admitting: Oncology

## 2023-04-08 ENCOUNTER — Inpatient Hospital Stay: Payer: PPO | Attending: Oncology

## 2023-04-08 VITALS — BP 137/63 | HR 89 | Temp 99.4°F | Resp 16 | Ht 65.0 in | Wt 142.6 lb

## 2023-04-08 DIAGNOSIS — Z9012 Acquired absence of left breast and nipple: Secondary | ICD-10-CM | POA: Insufficient documentation

## 2023-04-08 DIAGNOSIS — Z923 Personal history of irradiation: Secondary | ICD-10-CM | POA: Diagnosis not present

## 2023-04-08 DIAGNOSIS — C50411 Malignant neoplasm of upper-outer quadrant of right female breast: Secondary | ICD-10-CM | POA: Diagnosis not present

## 2023-04-08 DIAGNOSIS — I10 Essential (primary) hypertension: Secondary | ICD-10-CM | POA: Insufficient documentation

## 2023-04-08 DIAGNOSIS — Z1731 Human epidermal growth factor receptor 2 positive status: Secondary | ICD-10-CM | POA: Insufficient documentation

## 2023-04-08 DIAGNOSIS — Z79899 Other long term (current) drug therapy: Secondary | ICD-10-CM | POA: Diagnosis not present

## 2023-04-08 DIAGNOSIS — Z1721 Progesterone receptor positive status: Secondary | ICD-10-CM | POA: Diagnosis not present

## 2023-04-08 DIAGNOSIS — D649 Anemia, unspecified: Secondary | ICD-10-CM | POA: Insufficient documentation

## 2023-04-08 DIAGNOSIS — D0511 Intraductal carcinoma in situ of right breast: Secondary | ICD-10-CM | POA: Diagnosis not present

## 2023-04-08 DIAGNOSIS — C50911 Malignant neoplasm of unspecified site of right female breast: Secondary | ICD-10-CM

## 2023-04-08 DIAGNOSIS — Z170 Estrogen receptor positive status [ER+]: Secondary | ICD-10-CM | POA: Diagnosis not present

## 2023-04-08 LAB — CBC WITH DIFFERENTIAL/PLATELET
Abs Immature Granulocytes: 0.04 10*3/uL (ref 0.00–0.07)
Basophils Absolute: 0.1 10*3/uL (ref 0.0–0.1)
Basophils Relative: 1 %
Eosinophils Absolute: 0.2 10*3/uL (ref 0.0–0.5)
Eosinophils Relative: 2 %
HCT: 26.8 % — ABNORMAL LOW (ref 36.0–46.0)
Hemoglobin: 8.7 g/dL — ABNORMAL LOW (ref 12.0–15.0)
Immature Granulocytes: 1 %
Lymphocytes Relative: 13 %
Lymphs Abs: 1.1 10*3/uL (ref 0.7–4.0)
MCH: 29.9 pg (ref 26.0–34.0)
MCHC: 32.5 g/dL (ref 30.0–36.0)
MCV: 92.1 fL (ref 80.0–100.0)
Monocytes Absolute: 0.7 10*3/uL (ref 0.1–1.0)
Monocytes Relative: 8 %
Neutro Abs: 6.5 10*3/uL (ref 1.7–7.7)
Neutrophils Relative %: 75 %
Platelets: 283 10*3/uL (ref 150–400)
RBC: 2.91 MIL/uL — ABNORMAL LOW (ref 3.87–5.11)
RDW: 15.3 % (ref 11.5–15.5)
WBC: 8.5 10*3/uL (ref 4.0–10.5)
nRBC: 0 % (ref 0.0–0.2)

## 2023-04-08 LAB — COMPREHENSIVE METABOLIC PANEL
ALT: 17 U/L (ref 0–44)
AST: 23 U/L (ref 15–41)
Albumin: 3.6 g/dL (ref 3.5–5.0)
Alkaline Phosphatase: 34 U/L — ABNORMAL LOW (ref 38–126)
Anion gap: 9 (ref 5–15)
BUN: 17 mg/dL (ref 8–23)
CO2: 26 mmol/L (ref 22–32)
Calcium: 8.6 mg/dL — ABNORMAL LOW (ref 8.9–10.3)
Chloride: 104 mmol/L (ref 98–111)
Creatinine, Ser: 0.96 mg/dL (ref 0.44–1.00)
GFR, Estimated: 55 mL/min — ABNORMAL LOW (ref >60)
Glucose, Bld: 112 mg/dL — ABNORMAL HIGH (ref 70–99)
Potassium: 3.8 mmol/L (ref 3.5–5.1)
Sodium: 139 mmol/L (ref 135–145)
Total Bilirubin: 0.7 mg/dL (ref 0.0–1.2)
Total Protein: 5.6 g/dL — ABNORMAL LOW (ref 6.5–8.1)

## 2023-04-08 NOTE — Progress Notes (Signed)
 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  INTERVAL HISTORY: Patient returns to clinic today for routine 57-month evaluation and consideration of Prolia .  She is currently being treated for a UTI and was recently admitted with a diverticular bleed.  She has increased fatigue, but otherwise feels well.  She continues to tolerate tamoxifen  without significant side effects.  She has no neurologic complaints.  She denies any recent fevers or illnesses.  She has a good appetite and denies weight loss.  She has no chest pain, shortness of breath, cough, or hemoptysis.  She denies any nausea, vomiting, constipation, or diarrhea.  She has no further melena or hematochezia.  She has no urinary complaints.  Patient offers no further specific complaints today.  REVIEW OF SYSTEMS:   Review of Systems  Constitutional:  Positive for malaise/fatigue. Negative for fever and weight loss.  Respiratory: Negative.  Negative for cough, hemoptysis and shortness of breath.   Cardiovascular: Negative.  Negative for chest pain and leg swelling.  Gastrointestinal: Negative.  Negative for abdominal pain.  Genitourinary: Negative.  Negative for dysuria.  Musculoskeletal: Negative.  Negative for back pain.  Skin: Negative.  Negative for rash.  Neurological: Negative.  Negative for dizziness, focal weakness, weakness and headaches.  Psychiatric/Behavioral: Negative.  The patient is not nervous/anxious.     As per HPI. Otherwise, a complete  review of systems is negative.  PAST MEDICAL HISTORY: Past Medical History:  Diagnosis Date   Asthma    Basal cell carcinoma 06/29/2016   L lat chin nodular pattern   Breast cancer (HCC) 1999   right breast   Breast cancer (HCC) 12/2018   left breast   Cancer (HCC) 1999   Left breast pre cancerous- mastectomy with reconstruction   Hypertension    Personal history of chemotherapy     PAST SURGICAL HISTORY: Past Surgical History:  Procedure Laterality Date   AUGMENTATION MAMMAPLASTY Left 1999   BREAST BIOPSY Right 11/30/2018   affirm stereo bx of calcs, x marker, positive   BREAST BIOPSY Right 11/30/2018   affirm bx of calcs, ribbon marker,positive   BREAST LUMPECTOMY Left 12/16/2018   BREAST LUMPECTOMY WITH NEEDLE LOCALIZATION AND AXILLARY SENTINEL LYMPH NODE BX Right 12/16/2018   Procedure: BREAST LUMPECTOMY WITH NEEDLE LOCALIZATION AND AXILLARY SENTINEL LYMPH NODE BX;  Surgeon: Dessa Reyes ORN, MD;  Location: [facility] ORS;  Service: General;  Laterality: Right;   KIDNEY CYST REMOVAL     MASTECTOMY Left 1999   Implant   REDUCTION MAMMAPLASTY Right 1997    FAMILY HISTORY: Family History  Problem Relation Age of Onset   Breast cancer Neg Hx     ADVANCED DIRECTIVES (Y/N):  N  HEALTH MAINTENANCE: Social History   Tobacco Use   Smoking status: Never   Smokeless tobacco: Never  Vaping Use   Vaping status: Never Used  Substance Use Topics   Alcohol use: Never   Drug use: Never     Colonoscopy:  PAP:  Bone density:  Lipid panel:  Allergies  Allergen Reactions   Cephalexin Other (See Comments)  Possible headache     Current Outpatient Medications  Medication Sig Dispense Refill   ADVAIR DISKUS 250-50 MCG/DOSE AEPB Inhale 1 puff into the lungs every 12 (twelve) hours.     alendronate  (FOSAMAX ) 70 MG tablet TAKE 1 TABLET EVERY 7 DAYS WITH A FULL GLASS OF WATER ON AN EMPTY STOMACH DO NOT LIE DOWN FOR AT LEAST 30 MIN 12 tablet 3   Biotin 5 MG CAPS Take 5 mg  by mouth daily.     Cholecalciferol 25 MCG (1000 UT) tablet Take 1,000 Units by mouth daily.     iron  polysaccharides (NIFEREX) 150 MG capsule Take 1 capsule (150 mg total) by mouth daily. 30 capsule 1   latanoprost  (XALATAN ) 0.005 % ophthalmic solution Place 1 drop into both eyes at bedtime.     Multiple Vitamins-Minerals (MULTIVITAL PLATINUM SILVER PO) Take 1 tablet by mouth daily.     Multiple Vitamins-Minerals (PRESERVISION AREDS) CAPS Take 1 capsule by mouth 2 (two) times daily.     nitrofurantoin , macrocrystal-monohydrate, (MACROBID ) 100 MG capsule Take 1 capsule by mouth 2 (two) times daily.     nystatin cream (MYCOSTATIN) Apply 1 Application topically 2 (two) times daily.     pantoprazole  (PROTONIX ) 40 MG tablet Take 1 tablet (40 mg total) by mouth daily. 30 tablet 1   polyethylene glycol (MIRALAX  / GLYCOLAX ) 17 g packet Take 17 g by mouth daily. 14 each 0   potassium chloride  (K-DUR,KLOR-CON ) 10 MEQ tablet Take 10 mEq by mouth 2 (two) times daily.     PROAIR  HFA 108 (90 Base) MCG/ACT inhaler Inhale 2 puffs into the lungs every 4 (four) hours as needed for wheezing.     spironolactone  (ALDACTONE ) 25 MG tablet Take 25 mg by mouth daily.     tamoxifen  (NOLVADEX ) 10 MG tablet TAKE ONE (1) TABLET BY MOUTH TWO TIMES PER DAY 120 tablet 3   No current facility-administered medications for this visit.    OBJECTIVE: Vitals:   04/08/23 1400  BP: 137/63  Pulse: 89  Resp: 16  Temp: 99.4 F (37.4 C)  SpO2: 97%     Body mass index is 23.73 kg/m.    ECOG FS:0 - Asymptomatic  General: Well-developed, well-nourished, no acute distress. Eyes: Pink conjunctiva, anicteric sclera. HEENT: Normocephalic, moist mucous membranes. Lungs: No audible wheezing or coughing. Heart: Regular rate and rhythm. Abdomen: Soft, nontender, no obvious distention. Musculoskeletal: No edema, cyanosis, or clubbing. Neuro: Alert, answering all questions appropriately. Cranial nerves grossly intact. Skin: No rashes  or petechiae noted. Psych: Normal affect.  LAB RESULTS:  Lab Results  Component Value Date   NA 139 04/08/2023   K 3.8 04/08/2023   CL 104 04/08/2023   CO2 26 04/08/2023   GLUCOSE 112 (H) 04/08/2023   BUN 17 04/08/2023   CREATININE 0.96 04/08/2023   CALCIUM 8.6 (L) 04/08/2023   PROT 5.6 (L) 04/08/2023   ALBUMIN 3.6 04/08/2023   AST 23 04/08/2023   ALT 17 04/08/2023   ALKPHOS 34 (L) 04/08/2023   BILITOT 0.7 04/08/2023   GFRNONAA 55 (L) 04/08/2023   GFRAA >60 11/07/2019    Lab Results  Component Value Date   WBC 8.5 04/08/2023   NEUTROABS 6.5 04/08/2023   HGB 8.7 (L) 04/08/2023   HCT 26.8 (L) 04/08/2023   MCV 92.1 04/08/2023   PLT 283 04/08/2023     STUDIES: CT ANGIO GI BLEED Result Date: 04/01/2023 CLINICAL DATA:  Left-sided abdominal pain and dark red stools, initial encounter EXAM: CTA ABDOMEN AND PELVIS  WITHOUT AND WITH CONTRAST TECHNIQUE: Multidetector CT imaging of the abdomen and pelvis was performed using the standard protocol during bolus administration of intravenous contrast. Multiplanar reconstructed images and MIPs were obtained and reviewed to evaluate the vascular anatomy. RADIATION DOSE REDUCTION: This exam was performed according to the departmental dose-optimization program which includes automated exposure control, adjustment of the mA and/or kV according to patient size and/or use of iterative reconstruction technique. CONTRAST:  OMNIPAQUE  IOHEXOL  350 MG/ML SOLN COMPARISON:  None Available. FINDINGS: VASCULAR Aorta: Abdominal aorta demonstrates atherosclerotic calcification without aneurysmal dilatation or dissection. Celiac: Patent without evidence of aneurysm, dissection, vasculitis or significant stenosis. SMA: Patent without evidence of aneurysm, dissection, vasculitis or significant stenosis. Renals: Both renal arteries are patent without evidence of aneurysm, dissection, vasculitis, fibromuscular dysplasia or significant stenosis. IMA: Patent  without evidence of aneurysm, dissection, vasculitis or significant stenosis. Inflow: Iliacs demonstrate atherosclerotic calcification without focal stenosis. Veins: No specific venous abnormality is noted. Review of the MIP images confirms the above findings. NON-VASCULAR Lower chest: No acute abnormality.  Left breast implant is noted. Hepatobiliary: Multiple hepatic cysts are identified. The largest of these demonstrates peripheral calcification. No significant interval changes noted. The gallbladder is decompressed with multiple stones within. Pancreas: Scattered pancreatic cysts are noted. The largest of these lies in the tail measuring 20 mm. Given the patient's age no further follow-up is recommended. Spleen: Normal in size without focal abnormality. Adrenals/Urinary Tract: Multiple renal cysts are noted. No follow-up is recommended. No renal calculi or obstructive changes are seen. The bladder is well distended. Stomach/Bowel: Scattered diverticular change of the colon is noted without evidence of diverticulitis. The appendix is well visualized and within normal limits. No pooling of contrast to suggest active GI hemorrhage is seen. Stomach and small bowel appear within normal limits. Lymphatic: No lymphadenopathy is noted. Reproductive: Status post hysterectomy. No adnexal masses. Other: No abdominal wall hernia or abnormality. No abdominopelvic ascites. Musculoskeletal: No acute or significant osseous findings. IMPRESSION: VASCULAR No findings to suggest active GI hemorrhage. Scattered atherosclerotic changes are seen. NON-VASCULAR Diverticulosis without diverticulitis. Hepatic, pancreatic and renal cysts. Given the patient's age and appearance no further follow-up is recommended. Electronically Signed   By: Oneil Devonshire M.D.   On: 04/01/2023 03:29   MM DIAG BREAST TOMO UNI RIGHT Result Date: 03/29/2023 CLINICAL DATA:  Status post right lumpectomy in October 2020 for IDC/DCIS, with the inferior margin  positive for DCIS and multiple additional margins with DCIS less than 1 mm away. Patient opted to not undergo re-excision due to her age and goals of care. She completed adjuvant radiation therapy and declined adjuvant chemotherapy. She is currently taking tamoxifen . She is status post a remote left mastectomy in 1999. EXAM: DIGITAL DIAGNOSTIC UNILATERAL RIGHT MAMMOGRAM WITH TOMOSYNTHESIS AND CAD TECHNIQUE: Right digital diagnostic mammography and breast tomosynthesis was performed. The images were evaluated with computer-aided detection. COMPARISON:  Previous exam(s). ACR Breast Density Category c: The breasts are heterogeneously dense, which may obscure small masses. FINDINGS: Stable postlumpectomy changes in the outer and central right breast. There is also unchanged diffuse right breast skin thickening, consistent with postradiation changes. Unchanged chronic right nipple inversion. No new suspicious mass, calcification, or other findings are identified in the right breast. IMPRESSION: Stable posttreatment changes in the right breast. No mammographic evidence of malignancy. RECOMMENDATION: Screening mammogram in one year. The patient is now 2 or more years post lumpectomy and therefore she may return to annual screening mammography, however the patient does remain eligible for  annual diagnostic mammography if preferred given history of breast cancer. I have discussed the findings and recommendations with the patient. If applicable, a reminder letter will be sent to the patient regarding the next appointment. BI-RADS CATEGORY  2: Benign. Electronically Signed   By: Dirk Arrant M.D.   On: 03/29/2023 11:30   DG Bone Density Result Date: 03/29/2023 EXAM: DUAL X-RAY ABSORPTIOMETRY (DXA) FOR BONE MINERAL DENSITY IMPRESSION: Your patient Hayley Nolan completed a BMD test on 03/29/2023 using the [employer] iDXA DXA System (software version: 14.10) manufactured by [employer]. The following summarizes  the results of our evaluation. Technologist: [facility] PATIENT BIOGRAPHICAL: Name: Hayley Nolan, Hayley Nolan Patient ID: [id number] Birth Date: 10/21/1927 Height: 65.0 in. Gender: Female Exam Date: 03/29/2023 Weight: 139.9 lbs. Indications: Advanced Age, Asthma, Caucasian, Height Loss, High Risk Meds, History of Breast Cancer, History of Fracture (Adult), Hysterectomy, Oophorectomy Bilateral, Postmenopausal, Previous Chemo and Radiation, Surgical Induced Menopause Fractures: Left ankle Treatments: Advair Inhaler, Calcium, Multi-Vitamin, ProAir  inhaler, Tamoxifen , Vitamin D DENSITOMETRY RESULTS: Site      Region     Measured Date Measured Age WHO Classification Young Adult T-score BMD         %Change vs. Previous Significant Change (*) AP Spine L1-L2 03/29/2023 93.5 Normal 0.4 1.222 g/cm2 0.8% - AP Spine L1-L2 03/26/2022 92.5 Normal 0.3 1.212 g/cm2 3.5% - AP Spine L1-L2 03/25/2021 91.5 Normal 0.0 1.171 g/cm2 2.3% - AP Spine L1-L2 03/21/2020 90.5 Normal -0.2 1.145 g/cm2 1.1% - AP Spine L1-L2 03/21/2019 89.5 Normal -0.3 1.133 g/cm2 - - DualFemur Neck Right 03/29/2023 93.5 Osteopenia -1.4 0.837 g/cm2 1.0% - DualFemur Neck Right 03/26/2022 92.5 Osteopenia -1.5 0.829 g/cm2 2.6% - DualFemur Neck Right 03/25/2021 91.5 Osteopenia -1.7 0.808 g/cm2 -2.3% - DualFemur Neck Right 03/21/2020 90.5 Osteopenia -1.5 0.827 g/cm2 -6.4% Yes DualFemur Neck Right 03/21/2019 89.5 Osteopenia -1.1 0.884 g/cm2 - - DualFemur Total Mean 03/29/2023 93.5 Normal -0.7 0.917 g/cm2 0.3% - DualFemur Total Mean 03/26/2022 92.5 Normal -0.7 0.914 g/cm2 3.7% Yes DualFemur Total Mean 03/25/2021 91.5 Normal -1.0 0.881 g/cm2 0.2% - DualFemur Total Mean 03/21/2020 90.5 Normal -1.0 0.879 g/cm2 -1.6% - DualFemur Total Mean 03/21/2019 89.5 Normal -0.9 0.893 g/cm2 - - ASSESSMENT: The BMD measured at Femur Neck Right is 0.837 g/cm2 with a T-score of -1.4. This patient is considered osteopenic according to World Health Organization [facility]) criteria. The scan quality is good. L-3 &  4 was excluded due to degenerative changes. Compared with prior study, there has been no significant change in the spine. Compared with prior study, there has been no significant change in the total hip. Patient is not a candidate for FRAX due to exceeding age range. [employer] [facility]) criteria for post-menopausal, Caucasian Women: Normal:                   T-score at or above -1 SD Osteopenia/low bone mass: T-score between -1 and -2.5 SD Osteoporosis:             T-score at or below -2.5 SD RECOMMENDATIONS: 1. All patients should optimize calcium and vitamin D intake. 2. Consider FDA-approved medical therapies in postmenopausal women and men aged 15 years and older, based on the following: a. A hip or vertebral(clinical or morphometric) fracture b. T-score < -2.5 at the femoral neck or spine after appropriate evaluation to exclude secondary causes c. Low bone mass (T-score between -1.0 and -2.5 at the femoral neck or spine) and a 10-year probability of a hip fracture > 3% or  a 10-year probability of a major osteoporosis-related fracture > 20% based on the [place] -adapted WHO algorithm 3. Clinician judgment and/or patient preferences may indicate treatment for people with 10-year fracture probabilities above or below these levels FOLLOW-UP: People with diagnosed cases of osteoporosis or at high risk for fracture should have regular bone mineral density tests. For patients eligible for Medicare, routine testing is allowed once every 2 years. The testing frequency can be increased to one year for patients who have rapidly progressing disease, those who are receiving or discontinuing medical therapy to restore bone mass, or have additional risk factors. I have reviewed this report, and agree with the above findings. [facility] Electronically Signed   By: Dina  Arceo M.D.   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  Patient underwent lumpectomy on December 16, 2018.  Although guidelines recommend reexcision given her close margins, the benefit of this is unclear given patient's advanced age and she ultimately chose not to undergo resection.  She completed adjuvant XRT.  She declined adjuvant treatment with chemotherapy, but wanted to pursue single agent Orgivri which she completed on February 27, 2020.  She was initially started on letrozole , but this was transitioned to tamoxifen  secondary to osteoporosis.  She will complete 5 years of treatment in January 2026.  Given her high risk disease, will consider extending treatment for 7 to 10 years.  Patient's most recent mammogram on March 29, 2023 was reported BI-RADS 2.  Repeat in January 2026.  Return to clinic in 6 months for routine evaluation.  Osteopenia: Patient's most recent bone mineral density on March 29, 2023 reported T-score of -1.4 which is essentially unchanged from 1 year prior.  She last received Prolia  on June 18, 2022.  No intervention is needed at this time.  Repeat bone marrow density in January 2026.  Return to clinic in 6 months as above. Continue calcium and vitamin D supplementation.   Hypertension: Patient's blood pressure is within normal limits today.  Continue monitoring and treatment per primary care. Anemia: Likely secondary to recent diverticular bleed.  Monitor.  Patient expressed understanding and was in agreement with this plan. She also understands that She can call clinic at any time with any questions, concerns, or complaints.    Cancer Staging  Primary cancer of upper outer quadrant of right female breast [facility]) Staging form:  Breast, AJCC 8th Edition - Clinical stage from 12/12/2018: Stage IB (cT2, cN0, cM0, G3, ER+, PR+, HER2+) - Signed by Hayley Evalene PARAS, MD on 01/11/2019 Stage prefix: Initial diagnosis Histologic grading system: 3 grade system HER2-IHC interpretation: Equivocal HER2-IHC value: Score 2+ HER2-FISH interpretation: Positive   Evalene Nolan Jacobo, MD   04/08/2023 4:03 PM

## 2023-04-12 DIAGNOSIS — R2689 Other abnormalities of gait and mobility: Secondary | ICD-10-CM | POA: Diagnosis not present

## 2023-04-12 DIAGNOSIS — M6281 Muscle weakness (generalized): Secondary | ICD-10-CM | POA: Diagnosis not present

## 2023-04-12 DIAGNOSIS — R238 Other skin changes: Secondary | ICD-10-CM | POA: Diagnosis not present

## 2023-04-12 DIAGNOSIS — R262 Difficulty in walking, not elsewhere classified: Secondary | ICD-10-CM | POA: Diagnosis not present

## 2023-04-12 DIAGNOSIS — B351 Tinea unguium: Secondary | ICD-10-CM | POA: Diagnosis not present

## 2023-04-12 DIAGNOSIS — I872 Venous insufficiency (chronic) (peripheral): Secondary | ICD-10-CM | POA: Diagnosis not present

## 2023-06-14 ENCOUNTER — Other Ambulatory Visit: Payer: Self-pay | Admitting: Oncology

## 2023-06-25 DIAGNOSIS — M6281 Muscle weakness (generalized): Secondary | ICD-10-CM | POA: Diagnosis not present

## 2023-06-25 DIAGNOSIS — R601 Generalized edema: Secondary | ICD-10-CM | POA: Diagnosis not present

## 2023-06-25 DIAGNOSIS — I739 Peripheral vascular disease, unspecified: Secondary | ICD-10-CM | POA: Diagnosis not present

## 2023-06-25 DIAGNOSIS — I872 Venous insufficiency (chronic) (peripheral): Secondary | ICD-10-CM | POA: Diagnosis not present

## 2023-06-25 DIAGNOSIS — R262 Difficulty in walking, not elsewhere classified: Secondary | ICD-10-CM | POA: Diagnosis not present

## 2023-06-25 DIAGNOSIS — R238 Other skin changes: Secondary | ICD-10-CM | POA: Diagnosis not present

## 2023-06-25 DIAGNOSIS — R2689 Other abnormalities of gait and mobility: Secondary | ICD-10-CM | POA: Diagnosis not present

## 2023-06-25 DIAGNOSIS — B351 Tinea unguium: Secondary | ICD-10-CM | POA: Diagnosis not present

## 2023-07-01 DIAGNOSIS — D1801 Hemangioma of skin and subcutaneous tissue: Secondary | ICD-10-CM | POA: Diagnosis not present

## 2023-07-01 DIAGNOSIS — L821 Other seborrheic keratosis: Secondary | ICD-10-CM | POA: Diagnosis not present

## 2023-07-01 DIAGNOSIS — L814 Other melanin hyperpigmentation: Secondary | ICD-10-CM | POA: Diagnosis not present

## 2023-08-12 DIAGNOSIS — H903 Sensorineural hearing loss, bilateral: Secondary | ICD-10-CM | POA: Diagnosis not present

## 2023-08-12 DIAGNOSIS — H6123 Impacted cerumen, bilateral: Secondary | ICD-10-CM | POA: Diagnosis not present

## 2023-08-27 DIAGNOSIS — R2689 Other abnormalities of gait and mobility: Secondary | ICD-10-CM | POA: Diagnosis not present

## 2023-08-27 DIAGNOSIS — B351 Tinea unguium: Secondary | ICD-10-CM | POA: Diagnosis not present

## 2023-08-27 DIAGNOSIS — I872 Venous insufficiency (chronic) (peripheral): Secondary | ICD-10-CM | POA: Diagnosis not present

## 2023-08-27 DIAGNOSIS — R238 Other skin changes: Secondary | ICD-10-CM | POA: Diagnosis not present

## 2023-08-27 DIAGNOSIS — M6281 Muscle weakness (generalized): Secondary | ICD-10-CM | POA: Diagnosis not present

## 2023-08-27 DIAGNOSIS — R262 Difficulty in walking, not elsewhere classified: Secondary | ICD-10-CM | POA: Diagnosis not present

## 2023-08-30 DIAGNOSIS — H353231 Exudative age-related macular degeneration, bilateral, with active choroidal neovascularization: Secondary | ICD-10-CM | POA: Diagnosis not present

## 2023-08-30 DIAGNOSIS — H353211 Exudative age-related macular degeneration, right eye, with active choroidal neovascularization: Secondary | ICD-10-CM | POA: Diagnosis not present

## 2023-08-30 DIAGNOSIS — H2512 Age-related nuclear cataract, left eye: Secondary | ICD-10-CM | POA: Diagnosis not present

## 2023-09-14 DIAGNOSIS — N1831 Chronic kidney disease, stage 3a: Secondary | ICD-10-CM | POA: Diagnosis not present

## 2023-09-14 DIAGNOSIS — I129 Hypertensive chronic kidney disease with stage 1 through stage 4 chronic kidney disease, or unspecified chronic kidney disease: Secondary | ICD-10-CM | POA: Diagnosis not present

## 2023-09-21 DIAGNOSIS — F0390 Unspecified dementia without behavioral disturbance: Secondary | ICD-10-CM | POA: Diagnosis not present

## 2023-09-21 DIAGNOSIS — E876 Hypokalemia: Secondary | ICD-10-CM | POA: Diagnosis not present

## 2023-09-21 DIAGNOSIS — I129 Hypertensive chronic kidney disease with stage 1 through stage 4 chronic kidney disease, or unspecified chronic kidney disease: Secondary | ICD-10-CM | POA: Diagnosis not present

## 2023-09-21 DIAGNOSIS — E7800 Pure hypercholesterolemia, unspecified: Secondary | ICD-10-CM | POA: Diagnosis not present

## 2023-09-21 DIAGNOSIS — N1831 Chronic kidney disease, stage 3a: Secondary | ICD-10-CM | POA: Diagnosis not present

## 2023-09-21 DIAGNOSIS — R300 Dysuria: Secondary | ICD-10-CM | POA: Diagnosis not present

## 2023-09-21 DIAGNOSIS — J4521 Mild intermittent asthma with (acute) exacerbation: Secondary | ICD-10-CM | POA: Diagnosis not present

## 2023-10-01 NOTE — Progress Notes (Signed)
 [facility] Quality Team Note  Name: SHANTIA SANFORD Date of Birth: 06/20/1927 MRN: [id number] Date: 10/01/2023  [facility] Quality Team has reviewed this patient's chart, please see recommendations below:  [facility] Quality Other; (CHART REVIEWED FOR [facility]. ABSTRACTED ALL COMPONENTS FOR D/C 04/03/2023)

## 2023-10-05 DIAGNOSIS — H353211 Exudative age-related macular degeneration, right eye, with active choroidal neovascularization: Secondary | ICD-10-CM | POA: Diagnosis not present

## 2023-10-05 DIAGNOSIS — H353231 Exudative age-related macular degeneration, bilateral, with active choroidal neovascularization: Secondary | ICD-10-CM | POA: Diagnosis not present

## 2023-10-07 ENCOUNTER — Inpatient Hospital Stay: Payer: PPO | Attending: Oncology | Admitting: Oncology

## 2023-10-07 ENCOUNTER — Encounter: Payer: Self-pay | Admitting: Oncology

## 2023-10-07 VITALS — BP 140/70 | HR 76 | Temp 98.7°F | Resp 16 | Ht 63.0 in | Wt 141.0 lb

## 2023-10-07 DIAGNOSIS — Z79899 Other long term (current) drug therapy: Secondary | ICD-10-CM | POA: Diagnosis not present

## 2023-10-07 DIAGNOSIS — Z1732 Human epidermal growth factor receptor 2 negative status: Secondary | ICD-10-CM | POA: Insufficient documentation

## 2023-10-07 DIAGNOSIS — I10 Essential (primary) hypertension: Secondary | ICD-10-CM | POA: Insufficient documentation

## 2023-10-07 DIAGNOSIS — M810 Age-related osteoporosis without current pathological fracture: Secondary | ICD-10-CM

## 2023-10-07 DIAGNOSIS — Z170 Estrogen receptor positive status [ER+]: Secondary | ICD-10-CM | POA: Diagnosis not present

## 2023-10-07 DIAGNOSIS — C50511 Malignant neoplasm of lower-outer quadrant of right female breast: Secondary | ICD-10-CM | POA: Insufficient documentation

## 2023-10-07 DIAGNOSIS — C50411 Malignant neoplasm of upper-outer quadrant of right female breast: Secondary | ICD-10-CM | POA: Diagnosis not present

## 2023-10-07 DIAGNOSIS — M8580 Other specified disorders of bone density and structure, unspecified site: Secondary | ICD-10-CM | POA: Diagnosis not present

## 2023-10-07 DIAGNOSIS — Z1721 Progesterone receptor positive status: Secondary | ICD-10-CM | POA: Diagnosis not present

## 2023-10-07 DIAGNOSIS — D649 Anemia, unspecified: Secondary | ICD-10-CM | POA: Insufficient documentation

## 2023-10-07 NOTE — Progress Notes (Unsigned)
 Patient is doing ok, no new questions or concerns for the doctor today.

## 2023-10-07 NOTE — Progress Notes (Unsigned)
 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  INTERVAL HISTORY: Patient returns to clinic today for routine 82-month evaluation.  She currently feels well and is asymptomatic.  She does not complain of any fatigue today.  She continues to tolerate tamoxifen  well without significant side effects.  She has no neurologic complaints.  She denies any recent fevers or illnesses.  She has a good appetite and denies weight loss.  She has no chest pain, shortness of breath, cough, or hemoptysis.  She denies any nausea, vomiting, constipation, or diarrhea.  He has no urinary complaints.  Patient offers no specific complaints today.  REVIEW OF SYSTEMS:   Review of Systems  Constitutional: Negative.  Negative for fever, malaise/fatigue and weight loss.  Respiratory: Negative.  Negative for cough, hemoptysis and shortness of breath.   Cardiovascular: Negative.  Negative for chest pain and leg swelling.  Gastrointestinal: Negative.  Negative for abdominal pain.  Genitourinary: Negative.  Negative for dysuria.  Musculoskeletal: Negative.  Negative for back pain.  Skin: Negative.  Negative for rash.  Neurological: Negative.  Negative for dizziness, focal weakness, weakness and headaches.  Psychiatric/Behavioral: Negative.  The patient is not nervous/anxious.     As per HPI. Otherwise, a complete review of systems is negative.  PAST MEDICAL HISTORY: Past Medical History:  Diagnosis Date   Asthma    Basal cell carcinoma  06/29/2016   L lat chin nodular pattern   Breast cancer (HCC) 1999   right breast   Breast cancer (HCC) 12/2018   left breast   Cancer (HCC) 1999   Left breast pre cancerous- mastectomy with reconstruction   Hypertension    Personal history of chemotherapy     PAST SURGICAL HISTORY: Past Surgical History:  Procedure Laterality Date   AUGMENTATION MAMMAPLASTY Left 1999   BREAST BIOPSY Right 11/30/2018   affirm stereo bx of calcs, x marker, positive   BREAST BIOPSY Right 11/30/2018   affirm bx of calcs, ribbon marker,positive   BREAST LUMPECTOMY Left 12/16/2018   BREAST LUMPECTOMY WITH NEEDLE LOCALIZATION AND AXILLARY SENTINEL LYMPH NODE BX Right 12/16/2018   Procedure: BREAST LUMPECTOMY WITH NEEDLE LOCALIZATION AND AXILLARY SENTINEL LYMPH NODE BX;  Surgeon: Dessa Reyes ORN, MD;  Location: [facility] ORS;  Service: General;  Laterality: Right;   KIDNEY CYST REMOVAL     MASTECTOMY Left 1999   Implant   REDUCTION MAMMAPLASTY Right 1997    FAMILY HISTORY: Family History  Problem Relation Age of Onset   Breast cancer Neg Hx     ADVANCED DIRECTIVES (Y/N):  N  HEALTH MAINTENANCE: Social History   Tobacco Use   Smoking status: Never   Smokeless tobacco: Never  Vaping Use   Vaping status: Never Used  Substance Use Topics   Alcohol use: Never   Drug use: Never     Colonoscopy:  PAP:  Bone density:  Lipid panel:  Allergies  Allergen Reactions   Cephalexin Other (See Comments)    Possible headache     Current Outpatient Medications  Medication Sig Dispense Refill   ADVAIR DISKUS 250-50  MCG/DOSE AEPB Inhale 1 puff into the lungs every 12 (twelve) hours.     alendronate  (FOSAMAX ) 70 MG tablet TAKE 1 TABLET EVERY 7 DAYS WITH A FULL GLASS OF WATER ON AN EMPTY STOMACH DO NOT LIE DOWN FOR AT LEAST 30 MIN 12 tablet 3   Biotin 5 MG CAPS Take 5 mg by mouth daily.     Cholecalciferol 25 MCG (1000 UT) tablet Take 1,000 Units by mouth daily.     iron  polysaccharides (NIFEREX)  150 MG capsule Take 1 capsule (150 mg total) by mouth daily. 30 capsule 1   latanoprost  (XALATAN ) 0.005 % ophthalmic solution Place 1 drop into both eyes at bedtime.     Multiple Vitamins-Minerals (MULTIVITAL PLATINUM SILVER PO) Take 1 tablet by mouth daily.     Multiple Vitamins-Minerals (PRESERVISION AREDS) CAPS Take 1 capsule by mouth 2 (two) times daily.     nystatin cream (MYCOSTATIN) Apply 1 Application topically 2 (two) times daily.     pantoprazole  (PROTONIX ) 40 MG tablet Take 1 tablet (40 mg total) by mouth daily. 30 tablet 1   potassium chloride  (K-DUR,KLOR-CON ) 10 MEQ tablet Take 10 mEq by mouth 2 (two) times daily.     PROAIR  HFA 108 (90 Base) MCG/ACT inhaler Inhale 2 puffs into the lungs every 4 (four) hours as needed for wheezing.     spironolactone  (ALDACTONE ) 25 MG tablet Take 25 mg by mouth daily.     tamoxifen  (NOLVADEX ) 10 MG tablet TAKE ONE (1) TABLET BY MOUTH TWO TIMES PER DAY 120 tablet 1   polyethylene glycol (MIRALAX  / GLYCOLAX ) 17 g packet Take 17 g by mouth daily. (Patient not taking: Reported on 10/07/2023) 14 each 0   No current facility-administered medications for this visit.    OBJECTIVE: Vitals:   10/07/23 1415 10/07/23 1419  BP: (!) 146/73 (!) 140/70  Pulse: 76   Resp: 16   Temp: 98.7 F (37.1 C)   SpO2: 96%      Body mass index is 24.98 kg/m.    ECOG FS:0 - Asymptomatic  General: Well-developed, well-nourished, no acute distress. Eyes: Pink conjunctiva, anicteric sclera. HEENT: Normocephalic, moist mucous membranes. Lungs: No audible wheezing or coughing. Heart: Regular rate and rhythm. Abdomen: Soft, nontender, no obvious distention. Musculoskeletal: No edema, cyanosis, or clubbing. Neuro: Alert, answering all questions appropriately. Cranial nerves grossly intact. Skin: No rashes or petechiae noted. Psych: Normal affect.  LAB RESULTS:  Lab Results  Component Value Date   NA 139 04/08/2023   K 3.8 04/08/2023   CL 104 04/08/2023   CO2 26  04/08/2023   GLUCOSE 112 (H) 04/08/2023   BUN 17 04/08/2023   CREATININE 0.96 04/08/2023   CALCIUM 8.6 (L) 04/08/2023   PROT 5.6 (L) 04/08/2023   ALBUMIN 3.6 04/08/2023   AST 23 04/08/2023   ALT 17 04/08/2023   ALKPHOS 34 (L) 04/08/2023   BILITOT 0.7 04/08/2023   GFRNONAA 55 (L) 04/08/2023   GFRAA >60 11/07/2019    Lab Results  Component Value Date   WBC 8.5 04/08/2023   NEUTROABS 6.5 04/08/2023   HGB 8.7 (L) 04/08/2023   HCT 26.8 (L) 04/08/2023   MCV 92.1 04/08/2023   PLT 283 04/08/2023     STUDIES: No results found.   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  Patient underwent lumpectomy on December 16, 2018.  Although guidelines recommend reexcision given her close margins, the benefit of this is unclear given patient's advanced age and she ultimately chose not to undergo resection.  She completed adjuvant XRT.  She declined adjuvant treatment with chemotherapy, but wanted to pursue single agent Orgivri which she completed on February 27, 2020.  She was initially started on letrozole , but this was transitioned to tamoxifen  secondary to osteoporosis.  She will complete 5 years of treatment in January 2026.  Her most recent mammogram on March 29, 2023 was reported BI-RADS 2.  Repeat mammogram in January 2026.  If mammogram continues to be within normal limits, can consider discharging patient from clinic.   Osteopenia: Patient's most recent bone mineral density on March 29, 2023 reported T-score of -1.4 which is essentially unchanged from 1 year prior.  She last received Prolia  on June 18, 2022.  No intervention is needed.   Repeat bone mineral density in January 2026 along with mammogram.  Continue calcium and vitamin D supplementation.   Hypertension: Patient's blood pressure is moderately elevated today.  Continue monitoring and treatment per primary care.  Anemia: Likely secondary to recent diverticular bleed.  Hemoglobin has slightly improved to 8.7.  Patient expressed understanding and was in agreement with this plan. She also understands that She can call clinic at any time with any questions, concerns, or complaints.    Cancer Staging  Primary cancer of upper outer quadrant of right female breast [facility]) Staging form: Breast, AJCC 8th Edition - Clinical stage from 12/12/2018: Stage IB (cT2, cN0, cM0, G3, ER+, PR+, HER2+) - Signed by Jacobo Evalene PARAS, MD on 01/11/2019 Stage prefix: Initial diagnosis Histologic grading system: 3 grade system HER2-IHC interpretation: Equivocal HER2-IHC value: Score 2+ HER2-FISH interpretation: Positive   Evalene PARAS Jacobo, MD   10/08/2023 1:24 PM

## 2023-10-08 ENCOUNTER — Encounter: Payer: Self-pay | Admitting: Oncology

## 2023-10-29 DIAGNOSIS — M6281 Muscle weakness (generalized): Secondary | ICD-10-CM | POA: Diagnosis not present

## 2023-10-29 DIAGNOSIS — B351 Tinea unguium: Secondary | ICD-10-CM | POA: Diagnosis not present

## 2023-10-29 DIAGNOSIS — R2689 Other abnormalities of gait and mobility: Secondary | ICD-10-CM | POA: Diagnosis not present

## 2023-10-29 DIAGNOSIS — R238 Other skin changes: Secondary | ICD-10-CM | POA: Diagnosis not present

## 2023-10-29 DIAGNOSIS — I872 Venous insufficiency (chronic) (peripheral): Secondary | ICD-10-CM | POA: Diagnosis not present

## 2023-10-29 DIAGNOSIS — R262 Difficulty in walking, not elsewhere classified: Secondary | ICD-10-CM | POA: Diagnosis not present

## 2023-11-05 ENCOUNTER — Other Ambulatory Visit: Payer: Self-pay | Admitting: Oncology

## 2023-11-16 DIAGNOSIS — H353221 Exudative age-related macular degeneration, left eye, with active choroidal neovascularization: Secondary | ICD-10-CM | POA: Diagnosis not present

## 2023-11-16 DIAGNOSIS — H353211 Exudative age-related macular degeneration, right eye, with active choroidal neovascularization: Secondary | ICD-10-CM | POA: Diagnosis not present

## 2023-12-16 ENCOUNTER — Ambulatory Visit: Payer: PPO | Admitting: Dermatology

## 2023-12-31 DIAGNOSIS — M6281 Muscle weakness (generalized): Secondary | ICD-10-CM | POA: Diagnosis not present

## 2023-12-31 DIAGNOSIS — R262 Difficulty in walking, not elsewhere classified: Secondary | ICD-10-CM | POA: Diagnosis not present

## 2023-12-31 DIAGNOSIS — R2689 Other abnormalities of gait and mobility: Secondary | ICD-10-CM | POA: Diagnosis not present

## 2023-12-31 DIAGNOSIS — I872 Venous insufficiency (chronic) (peripheral): Secondary | ICD-10-CM | POA: Diagnosis not present

## 2023-12-31 DIAGNOSIS — B351 Tinea unguium: Secondary | ICD-10-CM | POA: Diagnosis not present

## 2023-12-31 DIAGNOSIS — R238 Other skin changes: Secondary | ICD-10-CM | POA: Diagnosis not present

## 2023-12-31 DIAGNOSIS — L603 Nail dystrophy: Secondary | ICD-10-CM | POA: Diagnosis not present

## 2024-01-04 ENCOUNTER — Other Ambulatory Visit: Payer: Self-pay | Admitting: Oncology

## 2024-01-11 DIAGNOSIS — H353211 Exudative age-related macular degeneration, right eye, with active choroidal neovascularization: Secondary | ICD-10-CM | POA: Diagnosis not present

## 2024-01-11 DIAGNOSIS — H353221 Exudative age-related macular degeneration, left eye, with active choroidal neovascularization: Secondary | ICD-10-CM | POA: Diagnosis not present

## 2024-02-22 ENCOUNTER — Ambulatory Visit: Admitting: Dermatology

## 2024-02-22 ENCOUNTER — Encounter: Payer: Self-pay | Admitting: Dermatology

## 2024-02-22 DIAGNOSIS — Z1283 Encounter for screening for malignant neoplasm of skin: Secondary | ICD-10-CM

## 2024-02-22 DIAGNOSIS — D1801 Hemangioma of skin and subcutaneous tissue: Secondary | ICD-10-CM

## 2024-02-22 DIAGNOSIS — L814 Other melanin hyperpigmentation: Secondary | ICD-10-CM

## 2024-02-22 DIAGNOSIS — L578 Other skin changes due to chronic exposure to nonionizing radiation: Secondary | ICD-10-CM

## 2024-02-22 DIAGNOSIS — Z85828 Personal history of other malignant neoplasm of skin: Secondary | ICD-10-CM

## 2024-02-22 DIAGNOSIS — D229 Melanocytic nevi, unspecified: Secondary | ICD-10-CM

## 2024-02-22 DIAGNOSIS — L820 Inflamed seborrheic keratosis: Secondary | ICD-10-CM | POA: Diagnosis not present

## 2024-02-22 DIAGNOSIS — L570 Actinic keratosis: Secondary | ICD-10-CM

## 2024-02-22 DIAGNOSIS — W908XXA Exposure to other nonionizing radiation, initial encounter: Secondary | ICD-10-CM

## 2024-02-22 DIAGNOSIS — L821 Other seborrheic keratosis: Secondary | ICD-10-CM | POA: Diagnosis not present

## 2024-02-22 NOTE — Patient Instructions (Addendum)

## 2024-02-22 NOTE — Progress Notes (Signed)
 "  Follow-Up Visit   Subjective  Hayley Nolan is a 89 y.o. female who presents for the following: Skin Cancer Screening and Full Body Skin Exam. Patient is here for her yearly TBSE, she reports a dry scab on her left cheek she wants looked at. Hx of BCC  Sister with patient   The patient presents for Total-Body Skin Exam (TBSE) for skin cancer screening and mole check. The patient has spots, moles and lesions to be evaluated, some may be new or changing and the patient may have concern these could be cancer.  The following portions of the chart were reviewed this encounter and updated as appropriate: medications, allergies, medical history  Review of Systems:  No other skin or systemic complaints except as noted in HPI or Assessment and Plan.  Objective  Well appearing patient in no apparent distress; mood and affect are within normal limits.  A full examination was performed including scalp, head, eyes, ears, nose, lips, neck, chest, axillae, abdomen, back, buttocks, bilateral upper extremities, bilateral lower extremities, hands, feet, fingers, toes, fingernails, and toenails. All findings within normal limits unless otherwise noted below.   Relevant physical exam findings are noted in the Assessment and Plan.  face x 6 (6) Erythematous thin papules/macules with gritty scale.  right upper back paraspinal x 1, Left neck inframandibular x 1 (2) Stuck-on, waxy, tan-brown papules and plaques -- Discussed benign etiology and prognosis.   Assessment & Plan   SKIN CANCER SCREENING PERFORMED TODAY.  LENTIGINES, SEBORRHEIC KERATOSES, HEMANGIOMAS - Benign normal skin lesions - Benign-appearing - Call for any changes  MELANOCYTIC NEVI - Tan-brown and/or pink-flesh-colored symmetric macules and papules - Benign appearing on exam today - Observation - Call clinic for new or changing moles - Recommend daily use of broad spectrum spf 30+ sunscreen to sun-exposed areas.   AK (ACTINIC  KERATOSIS) (6) face x 6 (6) ACTINIC DAMAGE - chronic, secondary to cumulative UV radiation exposure/sun exposure over time - diffuse scaly erythematous macules with underlying dyspigmentation - Recommend daily broad spectrum sunscreen SPF 30+ to sun-exposed areas, reapply every 2 hours as needed.  - Recommend staying in the shade or wearing long sleeves, sun glasses (UVA+UVB protection) and wide brim hats (4-inch brim around the entire circumference of the hat). - Call for new or changing lesions.  - Destruction of lesion - face x 6 (6) Complexity: simple   Destruction method: cryotherapy   Informed consent: discussed and consent obtained   Timeout:  patient name, date of birth, surgical site, and procedure verified Lesion destroyed using liquid nitrogen: Yes   Region frozen until ice ball extended beyond lesion: Yes   Outcome: patient tolerated procedure well with no complications   Post-procedure details: wound care instructions given    INFLAMED SEBORRHEIC KERATOSIS (2) right upper back paraspinal x 1, Left neck inframandibular x 1 (2) Symptomatic, irritating, patient would like treated.    Recheck area treated at the Left neck inframandibular in 3 months  - Destruction of lesion - right upper back paraspinal x 1, Left neck inframandibular x 1 (2) Complexity: simple   Destruction method: cryotherapy   Informed consent: discussed and consent obtained   Timeout:  patient name, date of birth, surgical site, and procedure verified Lesion destroyed using liquid nitrogen: Yes   Region frozen until ice ball extended beyond lesion: Yes   Outcome: patient tolerated procedure well with no complications   Post-procedure details: wound care instructions given     HISTORY OF BASAL  CELL CARCINOMA OF THE SKIN Left lateral chin 05/2016 - No evidence of recurrence today - Recommend regular full body skin exams - Recommend daily broad spectrum sunscreen SPF 30+ to sun-exposed areas, reapply  every 2 hours as needed.  - Call if any new or changing lesions are noted between office visits   Return in about 3 months (around 05/22/2024) for ISK .  I, Fay Kirks, CMA, am acting as scribe for Alm Rhyme, MD .   Documentation: I have reviewed the above documentation for accuracy and completeness, and I agree with the above.  Alm Rhyme, MD    "

## 2024-03-17 ENCOUNTER — Emergency Department

## 2024-03-17 ENCOUNTER — Emergency Department
Admission: EM | Admit: 2024-03-17 | Discharge: 2024-03-17 | Disposition: A | Discharge status: Home / Self Care | Attending: Emergency Medicine | Admitting: Emergency Medicine

## 2024-03-17 ENCOUNTER — Other Ambulatory Visit: Payer: Self-pay

## 2024-03-17 DIAGNOSIS — Y92009 Unspecified place in unspecified non-institutional (private) residence as the place of occurrence of the external cause: Secondary | ICD-10-CM | POA: Insufficient documentation

## 2024-03-17 DIAGNOSIS — R0602 Shortness of breath: Secondary | ICD-10-CM | POA: Diagnosis not present

## 2024-03-17 DIAGNOSIS — W0110XA Fall on same level from slipping, tripping and stumbling with subsequent striking against unspecified object, initial encounter: Secondary | ICD-10-CM | POA: Diagnosis not present

## 2024-03-17 DIAGNOSIS — Y9301 Activity, walking, marching and hiking: Secondary | ICD-10-CM | POA: Diagnosis not present

## 2024-03-17 DIAGNOSIS — F0390 Unspecified dementia without behavioral disturbance: Secondary | ICD-10-CM | POA: Insufficient documentation

## 2024-03-17 DIAGNOSIS — S0101XA Laceration without foreign body of scalp, initial encounter: Secondary | ICD-10-CM | POA: Insufficient documentation

## 2024-03-17 MED ORDER — ALBUTEROL SULFATE (2.5 MG/3ML) 0.083% IN NEBU
2.5000 mg | INHALATION_SOLUTION | Freq: Once | RESPIRATORY_TRACT | Status: AC
  Administered 2024-03-17: 2.5 mg via RESPIRATORY_TRACT
  Filled 2024-03-17: qty 3

## 2024-03-17 NOTE — Discharge Instructions (Addendum)
 A small laceration on the back of the head was repaired with 1 staple. This can be removed in 10 days on 03/27/24.

## 2024-03-17 NOTE — ED Provider Notes (Signed)
" ° °  [facility] Provider Note    Event Date/Time   First MD Initiated Contact with Patient 03/17/24 [phone]     (approximate)   History   Fall   HPI  Hayley Nolan is a 89 y.o. female with a history of dementia who had a trip and fall at home.  Denies any acute complaints.  No neck pain or headache.  Daughter reports patient has been in usual state of health recently, eating and drinking normally and taking her medications.     Physical Exam   Triage Vital Signs: ED Triage Vitals [03/17/24 0708]  Encounter Vitals Group     BP 138/77     Girls Systolic BP Percentile      Girls Diastolic BP Percentile      Boys Systolic BP Percentile      Boys Diastolic BP Percentile      Pulse Rate 99     Resp 20     Temp 97.6 F (36.4 C)     Temp Source Oral     SpO2 95 %     Weight      Height      Head Circumference      Peak Flow      Pain Score      Pain Loc      Pain Education      Exclude from Growth Chart     Most recent vital signs: Vitals:   03/17/24 0708  BP: 138/77  Pulse: 99  Resp: 20  Temp: 97.6 F (36.4 C)  SpO2: 95%    General: Awake, no distress.  CV:  Good peripheral perfusion.  Resp:  Normal effort.  Abd:  No distention.  Other:  Posterior scalp hematoma with 1 cm laceration, not gaping.  There is a subcutaneous hematoma.   ED Results / Procedures / Treatments   Labs (all labs ordered are listed, but only abnormal results are displayed) Labs Reviewed - No data to display   RADIOLOGY CT head and cervical spine unremarkable   PROCEDURES:  Procedures   MEDICATIONS ORDERED IN ED: Medications - No data to display   IMPRESSION / MDM / ASSESSMENT AND PLAN / ED COURSE  I reviewed the triage vital signs and the nursing notes.                             Patient presents with mechanical fall, head trauma.  Negative for ICH or C-spine fracture or skull fracture.  She does have a scalp hematoma.  Small laceration was  repaired with 1 staple.       FINAL CLINICAL IMPRESSION(S) / ED DIAGNOSES   Final diagnoses:  Laceration of scalp, initial encounter     Rx / DC Orders   ED Discharge Orders     None        Note:  This document was prepared using Dragon voice recognition software and may include unintentional dictation errors.   Viviann Pastor, MD 03/17/24 [phone]  "

## 2024-03-17 NOTE — ED Triage Notes (Addendum)
 Pt to ED via [facility] from [facility]. Unwitnessed fall. Lac to back of head. No blood thinners. Pt reports was walking to bathroom and slipped on rug and hit back of head. Pt denies LOC. Pt denies pain to any other body part except head. Hx of dementia. Pt A&Ox3 in triage.

## 2024-03-17 NOTE — ED Notes (Signed)
 Patient's daughter is taking the patient back to [facility].

## 2024-03-17 NOTE — ED Notes (Signed)
 [facility] called for transport to [facility] , spoke with Marybelle

## 2024-03-17 NOTE — ED Notes (Signed)
 Patient c/o shortness of breath. Neb treatment in progress.

## 2024-04-04 ENCOUNTER — Ambulatory Visit
Admission: RE | Admit: 2024-04-04 | Discharge: 2024-04-04 | Disposition: A | Source: Ambulatory Visit | Attending: Oncology | Admitting: Oncology

## 2024-04-04 DIAGNOSIS — M810 Age-related osteoporosis without current pathological fracture: Secondary | ICD-10-CM

## 2024-04-04 DIAGNOSIS — C50411 Malignant neoplasm of upper-outer quadrant of right female breast: Secondary | ICD-10-CM

## 2024-04-06 ENCOUNTER — Inpatient Hospital Stay: Admitting: Oncology

## 2024-04-06 ENCOUNTER — Encounter: Payer: Self-pay | Admitting: Oncology

## 2024-04-06 DIAGNOSIS — C50411 Malignant neoplasm of upper-outer quadrant of right female breast: Secondary | ICD-10-CM

## 2024-04-06 NOTE — Progress Notes (Signed)
 Patient states no concerns today. 2/3 Bone density and mammogram.

## 2024-04-06 NOTE — Progress Notes (Signed)
 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  INTERVAL HISTORY: Patient returns to clinic today for routine 20-month evaluation and discussion of her mammogram and bone mineral density results.  She currently feels well and at her baseline.  She continues to tolerate tamoxifen  without significant side effects. She has no neurologic complaints.  She denies any recent fevers or illnesses.  She has a good appetite and denies weight loss.  She has no chest pain, shortness of breath, cough, or hemoptysis.  She denies any nausea, vomiting, constipation, or diarrhea.  He has no urinary complaints.  Patient offers no specific complaints today.  REVIEW OF SYSTEMS:   Review of Systems  Constitutional: Negative.  Negative for fever, malaise/fatigue and weight loss.  Respiratory: Negative.  Negative for cough, hemoptysis and shortness of breath.   Cardiovascular: Negative.  Negative for chest pain and leg swelling.  Gastrointestinal: Negative.  Negative for abdominal pain.  Genitourinary: Negative.  Negative for dysuria.  Musculoskeletal: Negative.  Negative for back pain.  Skin: Negative.  Negative for rash.  Neurological: Negative.  Negative for dizziness, focal weakness, weakness and headaches.  Psychiatric/Behavioral: Negative.  The patient is not nervous/anxious.     As per HPI. Otherwise, a complete review of systems is negative.  PAST MEDICAL HISTORY: Past Medical History:  Diagnosis Date   Asthma    Basal cell carcinoma  06/29/2016   L lat chin nodular pattern   Breast cancer (HCC) 1999   right breast   Breast cancer (HCC) 12/2018   left breast   Cancer (HCC) 1999   Left breast pre cancerous- mastectomy with reconstruction   Hypertension    Personal history of chemotherapy     PAST SURGICAL HISTORY: Past Surgical History:  Procedure Laterality Date   AUGMENTATION MAMMAPLASTY Left 1999   BREAST BIOPSY Right 11/30/2018   affirm stereo bx of calcs, x marker, positive   BREAST BIOPSY Right 11/30/2018   affirm bx of calcs, ribbon marker,positive   BREAST LUMPECTOMY Left 12/16/2018   BREAST LUMPECTOMY WITH NEEDLE LOCALIZATION AND AXILLARY SENTINEL LYMPH NODE BX Right 12/16/2018   Procedure: BREAST LUMPECTOMY WITH NEEDLE LOCALIZATION AND AXILLARY SENTINEL LYMPH NODE BX;  Surgeon: Dessa Reyes ORN, MD;  Location: [facility] OR[facility];  Service: General;  Laterality: Right;   KIDNEY CYST REMOVAL     MASTECTOMY Left 1999   Implant   REDUCTION MAMMAPLASTY Right 1997    FAMILY HISTORY: Family History  Problem Relation Age of Onset   Breast cancer Neg Hx     ADVANCED DIRECTIVES (Y/N):  N  HEALTH MAINTENANCE: Social History   Tobacco Use   Smoking status: Never   Smokeless tobacco: Never  Vaping Use   Vaping status: Never Used  Substance Use Topics   Alcohol use: Never   Drug use: Never     Colonoscopy:  PAP:  Bone density:  Lipid panel:  Allergies  Allergen Reactions   Cephalexin Other (See Comments)    Possible headache     Current Outpatient Medications  Medication Sig Dispense Refill   ADVAIR DISKUS 250-50 MCG/DOSE  AEPB Inhale 1 puff into the lungs every 12 (twelve) hours.     Cholecalciferol 25 MCG (1000 UT) tablet Take 1,000 Units by mouth daily.     Multiple Vitamins-Minerals (MULTIVITAL PLATINUM SILVER PO) Take 1 tablet by mouth daily.     Multiple Vitamins-Minerals (PRESERVISION AREDS) CAPS Take 1 capsule by mouth 2 (two) times daily.     nystatin cream (MYCOSTATIN) Apply 1  Application topically 2 (two) times daily.     pantoprazole  (PROTONIX ) 40 MG tablet Take 1 tablet (40 mg total) by mouth daily. 30 tablet 1   potassium chloride  (K-DUR,KLOR-CON ) 10 MEQ tablet Take 10 mEq by mouth 2 (two) times daily.     PROAIR  HFA 108 (90 Base) MCG/ACT inhaler Inhale 2 puffs into the lungs every 4 (four) hours as needed for wheezing.     spironolactone  (ALDACTONE ) 25 MG tablet Take 25 mg by mouth daily.     tamoxifen  (NOLVADEX ) 10 MG tablet TAKE ONE (1) TABLET BY MOUTH TWO TIMES PER DAY 120 tablet 1   alendronate  (FOSAMAX ) 70 MG tablet TAKE 1 TABLET EVERY 7 DAYS WITH A FULL GLASS OF WATER ON AN EMPTY STOMACH DO NOT LIE DOWN FOR AT LEAST 30 MIN (Patient not taking: Reported on 04/06/2024) 12 tablet 3   Biotin 5 MG CAPS Take 5 mg by mouth daily. (Patient not taking: Reported on 04/06/2024)     iron  polysaccharides (NIFEREX) 150 MG capsule Take 1 capsule (150 mg total) by mouth daily. (Patient not taking: Reported on 04/06/2024) 30 capsule 1   latanoprost  (XALATAN ) 0.005 % ophthalmic solution Place 1 drop into both eyes at bedtime. (Patient not taking: Reported on 04/06/2024)     polyethylene glycol (MIRALAX  / GLYCOLAX ) 17 g packet Take 17 g by mouth daily. (Patient not taking: Reported on 04/06/2024) 14 each 0   No current facility-administered medications for this visit.    OBJECTIVE: Vitals:   04/06/24 1011 04/06/24 1042  BP: (!) 171/75 (!) 172/75  Pulse: 81   Resp: 20   Temp: 98.4 F (36.9 C)   SpO2: 97%      Body mass index is 24.09 kg/m.    ECOG FS:0 - Asymptomatic  General: Well-developed, well-nourished, no acute distress. Eyes: Pink conjunctiva, anicteric sclera. HEENT: Normocephalic, moist mucous membranes. Lungs: No audible wheezing or coughing. Heart: Regular rate and rhythm. Abdomen: Soft, nontender, no obvious distention. Musculoskeletal: No edema, cyanosis, or clubbing. Neuro: Alert, answering all questions appropriately. Cranial nerves grossly intact. Skin: No  rashes or petechiae noted. Psych: Normal affect.  LAB RESULTS:  Lab Results  Component Value Date   NA 139 04/08/2023   K 3.8 04/08/2023   CL 104 04/08/2023   CO2 26 04/08/2023   GLUCOSE 112 (H) 04/08/2023   BUN 17 04/08/2023   CREATININE 0.96 04/08/2023   CALCIUM 8.6 (L) 04/08/2023   PROT 5.6 (L) 04/08/2023   ALBUMIN 3.6 04/08/2023   AST 23 04/08/2023   ALT 17 04/08/2023   ALKPHOS 34 (L) 04/08/2023   BILITOT 0.7 04/08/2023   GFRNONAA 55 (L) 04/08/2023   GFRAA >60 11/07/2019    Lab Results  Component Value Date   WBC 8.5 04/08/2023   NEUTROABS 6.5 04/08/2023   HGB 8.7 (L) 04/08/2023   HCT 26.8 (L) 04/08/2023   MCV 92.1 04/08/2023   PLT 283 04/08/2023     STUDIES: DG Bone Density Result Date: 04/05/2024 EXAM: DUAL X-RAY ABSORPTIOMETRY (DXA) FOR BONE MINERAL DENSITY 04/04/2024 10:03 am CLINICAL DATA:  89 year old  Female Postmenopausal. Postmenopausal History of fragility fracture. TECHNIQUE: An axial (e.g., hips, spine) and/or appendicular (e.g., radius) exam was performed, as appropriate, using GE Secretary/administrator at [facility]. Images are obtained for bone mineral density measurement and are not obtained for diagnostic purposes. [id number] Exclusions: L3-L4 due to degenerative changes. COMPARISON:  03/25/2021, 03/29/2023. FINDINGS: Scan quality: Good. LUMBAR SPINE (L1-L2): BMD (in g/cm2): 1.181 T-score: 0.1 Z-score: 2.1 Rate of change from previous exam: No significant rate of change from previous exam. LEFT FEMORAL NECK: BMD (in g/cm2): 0.890 T-score: -1.1 Z-score: 1.8 LEFT TOTAL HIP: BMD (in g/cm2): 0.928 T-score: -0.6 Z-score: 2.2 RIGHT FEMORAL NECK: BMD (in g/cm2): 0.819 T-score: -1.6 Z-score: 1.2 RIGHT TOTAL HIP: BMD (in g/cm2): 0.888 T-score: -0.9 Z-score: 1.8 DUAL-FEMUR TOTAL MEAN: Rate of change from previous exam: No significant rate of change from previous exam. LEFT FOREARM (RADIUS 33%): BMD (in g/cm2): 0.519 T-score: -4.1 Z-score: -0.1 Rate  of change from previous exam: No significant rate of change from previous exam. FRAX 10-YEAR PROBABILITY OF FRACTURE: FRAX not reported as the lowest BMD is not in the osteopenia range. IMPRESSION: Osteoporosis based on BMD. Fracture risk is increased. Increased risk is based on low BMD. RECOMMENDATIONS: 1. All patients should optimize calcium and vitamin D intake. 2. Consider FDA-approved medical therapies in postmenopausal women and men aged 24 years and older, based on the following: - A hip or vertebral (clinical or morphometric) fracture - T-score less than or equal to -2.5 and secondary causes have been excluded. - Low bone mass (T-score between -1.0 and -2.5) and a 10-year probability of a hip fracture greater than or equal to 3% or a 10-year probability of a major osteoporosis-related fracture greater than or equal to 20% based on the [place] -adapted WHO algorithm. - Clinician judgment and/or patient preferences may indicate treatment for people with 10-year fracture probabilities above or below these levels 3. Patients with diagnosis of osteoporosis or at high risk for fracture should have regular bone mineral density tests. For patients eligible for Medicare, routine testing is allowed once every 2 years. The testing frequency can be increased to one year for patients who have rapidly progressing disease, those who are receiving or discontinuing medical therapy to restore bone mass, or have additional risk factors. Electronically Signed   By: Debby Satterfield M.D.   On: 04/05/2024 09:11   MM 3D DIAGNOSTIC MAMMOGRAM UNILATERAL RIGHT BREAST Result Date: 04/04/2024 CLINICAL DATA:  History of LEFT breast mastectomy in 1999. Patient with history of RIGHT lumpectomy in 2020 with close margins on reexcision and radiation therapy. Remote RIGHT breast reduction in 1997. EXAM: DIGITAL DIAGNOSTIC UNILATERAL RIGHT MAMMOGRAM WITH TOMOSYNTHESIS AND CAD TECHNIQUE: Right digital diagnostic mammography and breast  tomosynthesis was performed. The images were evaluated with computer-aided detection. COMPARISON:  Previous exam(s). ACR Breast Density Category c: The breasts are heterogeneously dense, which may obscure small masses. FINDINGS: Examination is again significantly limited by the patient's mobility restrictions and instability. Patient declined to sit for the examination. Best images possible obtained. Today's images are in keeping with recent priors. Postlumpectomy changes of the RIGHT breast without new suspicious mass, architectural distortion, calcification or other concerning abnormality. There is no mammographic evidence of malignancy in the RIGHT breast. The patient is status post remote LEFT breast mastectomy. IMPRESSION: No mammographic evidence of malignancy in the RIGHT breast. Limitations as noted above. RECOMMENDATION: As the patient is now over 2 years out from her lumpectomy, she may return to annual screening mammography in  1 year. Given her history of breast cancer, she remains eligible for annual diagnostic mammography, if preferred. I have discussed the findings and recommendations with the patient. If applicable, a reminder letter will be sent to the patient regarding the next appointment. BI-RADS CATEGORY  2: Benign. Electronically Signed   By: Norleen Croak M.D.   On: 04/04/2024 10:54   CT Cervical Spine Wo Contrast Result Date: 03/17/2024 EXAM: CT CERVICAL SPINE WITHOUT CONTRAST 03/17/2024 07:30:32 AM TECHNIQUE: CT of the cervical spine was performed without the administration of intravenous contrast. Multiplanar reformatted images are provided for review. Automated exposure control, iterative reconstruction, and/or weight based adjustment of the mA/kV was utilized to reduce the radiation dose to as low as reasonably achievable. COMPARISON: MRI head 07/17/2022. CLINICAL HISTORY: fall FINDINGS: BONES AND ALIGNMENT: Reversal of the normal cervical lordosis. 5 mm degenerative anterolisthesis of  C3 on C4, minimally increased. Trace anterolisthesis of C4 on C5, C5 on C6, C6 on C7, and C7 on T1. No acute fracture. DEGENERATIVE CHANGES: Severe bilateral C1-C2 arthropathy. Multilevel facet arthrosis, particularly severe at C3-C4 on the left where there are erosions. Lucencies are also present in the opposing lamina and spinous processes of C3 and C4 including a 1.3 cm lucency in the C3 spinous process, indeterminate though may also be degenerative erosions and geodes related to the advanced arthropathy given apparent continuity with the degenerated joints. No destructive bone lesion elsewhere in the cervical spine to strongly suggest the presence of a disseminated process such as metastatic disease or multiple myeloma. Bilateral facet ankylosis at C2-C3 and C5-C6 with interbody ankylosis at the latter as well. Asymmetrically advanced right sided neural foraminal stenosis at C3-C4 and C4-C5 due to uncovertebral and facet spurring. No evidence of high grade spinal canal stenosis. SOFT TISSUES: No prevertebral soft tissue swelling. IMPRESSION: 1. No acute cervical spine fracture. 2. Advanced degenerative changes as above. Electronically signed by: Dasie Hamburg MD 03/17/2024 07:59 AM EST RP Workstation: [id number]   CT Head Wo Contrast Result Date: 03/17/2024 EXAM: CT HEAD WITHOUT CONTRAST 03/17/2024 07:30:32 AM TECHNIQUE: CT of the head was performed without the administration of intravenous contrast. Automated exposure control, iterative reconstruction, and/or weight based adjustment of the mA/kV was utilized to reduce the radiation dose to as low as reasonably achievable. COMPARISON: Head CT and MRI 07/17/2022. CLINICAL HISTORY: Fall. Posterior scalp laceration. FINDINGS: The examination is moderately motion degraded. BRAIN AND VENTRICLES: Within limitations of motion artifact, no acute infarct, intracranial hemorrhage, mass, midline shift, hydrocephalus, or extra-axial fluid collection is identified. Cerebral  white matter hypodensities are nonspecific but compatible with mild chronic small vessel ischemic disease. Mild cerebral atrophy is within normal limits for age. Calcified atherosclerosis at the skull base. ORBITS: Right cataract extraction. SINUSES: Moderate mucosal thickening in the ethmoid sinuses. Clear mastoid air cells. SOFT TISSUES AND SKULL: Moderately large left parietal scalp hematoma. No skull fracture. IMPRESSION: 1. No acute intracranial abnormality on this motion-degraded examination. 2. Left parietal scalp hematoma. 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  Patient underwent lumpectomy on December 16, 2018.  Although guidelines recommend reexcision given her close margins, the benefit of this is unclear given patient's advanced age and she ultimately chose not to undergo resection.  She completed adjuvant XRT.  She declined adjuvant treatment with chemotherapy, but wanted to pursue single agent Orgivri which she completed on February 27, 2020.  She was initially started on letrozole , but this was transitioned to tamoxifen  secondary to osteoporosis.  Patient has now completed 5 years of tamoxifen  and has been instructed to discontinue treatment.  Her most recent mammogram in February 2026 was reported as BI-RADS 2.  Patient will return to clinic in 1 year with repeat mammogram and further evaluation at which time she likely can be discharged from clinic.  Osteoporosis: Patient's bone mineral density from April 04, 2024 revealed persistent  osteoporosis.  She last received Prolia  on June 18, 2022.  Will defer to primary care for evaluation and treatment.  Continue calcium and vitamin D supplementation.   Hypertension: Chronic and unchanged.  Patient's blood pressure is moderately elevated today.  Continue monitoring and treatment per primary care.   Patient expressed understanding and was in agreement with this plan. She also understands that She can call clinic at any time with any questions, concerns, or complaints.    Cancer Staging  Primary cancer of upper outer quadrant of right female breast [facility]) Staging form: Breast, AJCC 8th Edition - Clinical stage from 12/12/2018: Stage IB (cT2, cN0, cM0, G3, ER+, PR+, HER2+) - Signed by Jacobo Evalene PARAS, MD on 01/11/2019 Stage prefix: Initial diagnosis Histologic grading system: 3 grade system HER2-IHC interpretation: Equivocal HER2-IHC value: Score 2+ HER2-FISH interpretation: Positive   Evalene PARAS Jacobo, MD   04/06/2024 11:38 AM     "

## 2024-06-26 ENCOUNTER — Ambulatory Visit: Admitting: Dermatology

## 2025-04-11 ENCOUNTER — Inpatient Hospital Stay: Admitting: Oncology
